# Patient Record
Sex: Male | Born: 1960 | Race: Black or African American | Hispanic: No | Smoking: Never smoker
Health system: Southern US, Community
[De-identification: ages and names within clinical notes are randomized; demographics above are authoritative.]

## PROBLEM LIST (undated history)

## (undated) DIAGNOSIS — F1010 Alcohol abuse, uncomplicated: Secondary | ICD-10-CM

## (undated) DIAGNOSIS — K5730 Diverticulosis of large intestine without perforation or abscess without bleeding: Secondary | ICD-10-CM

## (undated) DIAGNOSIS — F1910 Other psychoactive substance abuse, uncomplicated: Secondary | ICD-10-CM

## (undated) DIAGNOSIS — I10 Essential (primary) hypertension: Secondary | ICD-10-CM

## (undated) DIAGNOSIS — K5792 Diverticulitis of intestine, part unspecified, without perforation or abscess without bleeding: Secondary | ICD-10-CM

## (undated) DIAGNOSIS — E119 Type 2 diabetes mellitus without complications: Secondary | ICD-10-CM

## (undated) DIAGNOSIS — E669 Obesity, unspecified: Secondary | ICD-10-CM

## (undated) HISTORY — DX: Type 2 diabetes mellitus without complications: E11.9

## (undated) HISTORY — DX: Diverticulosis of large intestine without perforation or abscess without bleeding: K57.30

---

## 1997-10-14 ENCOUNTER — Emergency Department (HOSPITAL_COMMUNITY): Admission: EM | Admit: 1997-10-14 | Discharge: 1997-10-14 | Payer: Self-pay | Admitting: Emergency Medicine

## 1999-08-18 ENCOUNTER — Inpatient Hospital Stay (HOSPITAL_COMMUNITY): Admission: EM | Admit: 1999-08-18 | Discharge: 1999-08-20 | Payer: Self-pay | Admitting: *Deleted

## 1999-08-18 ENCOUNTER — Encounter: Payer: Self-pay | Admitting: *Deleted

## 2000-02-17 ENCOUNTER — Encounter: Payer: Self-pay | Admitting: Emergency Medicine

## 2000-02-17 ENCOUNTER — Emergency Department (HOSPITAL_COMMUNITY): Admission: EM | Admit: 2000-02-17 | Discharge: 2000-02-18 | Payer: Self-pay | Admitting: Emergency Medicine

## 2010-02-03 DIAGNOSIS — K5730 Diverticulosis of large intestine without perforation or abscess without bleeding: Secondary | ICD-10-CM

## 2010-02-03 HISTORY — DX: Diverticulosis of large intestine without perforation or abscess without bleeding: K57.30

## 2013-01-24 ENCOUNTER — Encounter (HOSPITAL_COMMUNITY): Payer: Self-pay | Admitting: Emergency Medicine

## 2013-01-24 ENCOUNTER — Emergency Department (HOSPITAL_COMMUNITY)
Admission: EM | Admit: 2013-01-24 | Discharge: 2013-01-24 | Disposition: A | Discharge status: Home / Self Care | Payer: Self-pay | Attending: Emergency Medicine | Admitting: Emergency Medicine

## 2013-01-24 DIAGNOSIS — I10 Essential (primary) hypertension: Secondary | ICD-10-CM | POA: Insufficient documentation

## 2013-01-24 DIAGNOSIS — R42 Dizziness and giddiness: Secondary | ICD-10-CM | POA: Insufficient documentation

## 2013-01-24 DIAGNOSIS — Z79899 Other long term (current) drug therapy: Secondary | ICD-10-CM | POA: Insufficient documentation

## 2013-01-24 DIAGNOSIS — R000 Tachycardia, unspecified: Secondary | ICD-10-CM | POA: Insufficient documentation

## 2013-01-24 DIAGNOSIS — R030 Elevated blood-pressure reading, without diagnosis of hypertension: Secondary | ICD-10-CM

## 2013-01-24 DIAGNOSIS — K922 Gastrointestinal hemorrhage, unspecified: Secondary | ICD-10-CM | POA: Insufficient documentation

## 2013-01-24 HISTORY — DX: Diverticulitis of intestine, part unspecified, without perforation or abscess without bleeding: K57.92

## 2013-01-24 HISTORY — DX: Essential (primary) hypertension: I10

## 2013-01-24 LAB — CBC WITH DIFFERENTIAL/PLATELET
Basophils Absolute: 0 10*3/uL (ref 0.0–0.1)
Basophils Absolute: 0 10*3/uL (ref 0.0–0.1)
Basophils Relative: 0 % (ref 0–1)
Basophils Relative: 0 % (ref 0–1)
Eosinophils Absolute: 0.1 10*3/uL (ref 0.0–0.7)
Eosinophils Absolute: 0.1 10*3/uL (ref 0.0–0.7)
Eosinophils Relative: 1 % (ref 0–5)
Eosinophils Relative: 1 % (ref 0–5)
HCT: 37.1 % — ABNORMAL LOW (ref 39.0–52.0)
HCT: 35 % — ABNORMAL LOW (ref 39.0–52.0)
Hemoglobin: 12.4 g/dL — ABNORMAL LOW (ref 13.0–17.0)
Hemoglobin: 11.8 g/dL — ABNORMAL LOW (ref 13.0–17.0)
Lymphocytes Relative: 21 % (ref 12–46)
Lymphocytes Relative: 21 % (ref 12–46)
Lymphs Abs: 2 10*3/uL (ref 0.7–4.0)
Lymphs Abs: 2.6 10*3/uL (ref 0.7–4.0)
MCH: 28 pg (ref 26.0–34.0)
MCH: 28.2 pg (ref 26.0–34.0)
MCHC: 33.4 g/dL (ref 30.0–36.0)
MCHC: 33.7 g/dL (ref 30.0–36.0)
MCV: 83.7 fL (ref 78.0–100.0)
MCV: 83.7 fL (ref 78.0–100.0)
Monocytes Absolute: 0.7 10*3/uL (ref 0.1–1.0)
Monocytes Absolute: 0.8 10*3/uL (ref 0.1–1.0)
Monocytes Relative: 8 % (ref 3–12)
Monocytes Relative: 6 % (ref 3–12)
Neutro Abs: 6.8 10*3/uL (ref 1.7–7.7)
Neutro Abs: 8.7 10*3/uL — ABNORMAL HIGH (ref 1.7–7.7)
Neutrophils Relative %: 70 % (ref 43–77)
Neutrophils Relative %: 71 % (ref 43–77)
Platelets: 211 10*3/uL (ref 150–400)
Platelets: 187 10*3/uL (ref 150–400)
RBC: 4.43 MIL/uL (ref 4.22–5.81)
RBC: 4.18 MIL/uL — ABNORMAL LOW (ref 4.22–5.81)
RDW: 14.5 % (ref 11.5–15.5)
RDW: 14.6 % (ref 11.5–15.5)
WBC: 9.6 10*3/uL (ref 4.0–10.5)
WBC: 12.2 10*3/uL — ABNORMAL HIGH (ref 4.0–10.5)

## 2013-01-24 LAB — COMPREHENSIVE METABOLIC PANEL
ALT: 18 U/L (ref 0–53)
AST: 16 U/L (ref 0–37)
Albumin: 3.4 g/dL — ABNORMAL LOW (ref 3.5–5.2)
Alkaline Phosphatase: 76 U/L (ref 39–117)
BUN: 18 mg/dL (ref 6–23)
CO2: 27 mEq/L (ref 19–32)
Calcium: 9 mg/dL (ref 8.4–10.5)
Chloride: 102 mEq/L (ref 96–112)
Creatinine, Ser: 1.12 mg/dL (ref 0.50–1.35)
GFR calc Af Amer: 87 mL/min — ABNORMAL LOW (ref >90)
GFR calc non Af Amer: 75 mL/min — ABNORMAL LOW (ref >90)
Glucose, Bld: 149 mg/dL — ABNORMAL HIGH (ref 70–99)
Potassium: 3.6 mEq/L (ref 3.5–5.1)
Sodium: 140 mEq/L (ref 135–145)
Total Bilirubin: 0.3 mg/dL (ref 0.3–1.2)
Total Protein: 6.6 g/dL (ref 6.0–8.3)

## 2013-01-24 LAB — LIPASE, BLOOD: Lipase: 19 U/L (ref 11–59)

## 2013-01-24 LAB — OCCULT BLOOD, POC DEVICE: Fecal Occult Bld: POSITIVE — AB

## 2013-01-24 LAB — TYPE AND SCREEN
ABO/RH(D): A NEG
Antibody Screen: NEGATIVE

## 2013-01-24 MED ORDER — SODIUM CHLORIDE 0.9 % IV BOLUS (SEPSIS)
1000.0000 mL | Freq: Once | INTRAVENOUS | Status: AC
  Administered 2013-01-24: 1000 mL via INTRAVENOUS

## 2013-01-24 NOTE — ED Notes (Signed)
Patient reports that he began having rectal bleeding 3 days ago and describes it as black watery stool. Patient has a history of diverticulitis and states he has had rectal bleeding every year x 3 years. Patient states his abdomen is "bubbling."

## 2013-01-24 NOTE — ED Notes (Signed)
Went in to give pt his discharge instructions  Pt sitting in chair  Removed pt's IV and was talking with pt when he said he was feeling dizzy  Pt became pale and diaphoretic saying he felt like he was going to pass out  Wife at bedside  Cool compress applied to pt's head  B/P taken 112/81 Ginger ale given to pt to sip on  Assisted pt back to bed  Notified MD  Orders received

## 2013-01-24 NOTE — ED Provider Notes (Signed)
CSN: [id number]     Arrival date & time 01/24/13  1746 History   First MD Initiated Contact with Patient 01/24/13 1801     Chief Complaint  Patient presents with  . Rectal Bleeding   (Consider location/radiation/quality/duration/timing/severity/associated sxs/prior Treatment) HPI Comments: The patient is a 52 year-old male with a past medical history of HTN, Diverticulitis, presenting the Emergency Department with a chief complaint of blood in stools for 3 days.  The patient reports dark stools with bright red blood mixed in stool and in the toilet for 3 days.  He reports multiple episodes of blood in stools, 3 episodes today. He reports 3 similar episodes in the past, last episode required hospitalization was greater than 6 months ago and had an endoscopy and colonoscopy. Reports one episode of lightheadedness today after getting out of his vehicle and ambulating.  Denies history of abdominal surgeries, clotting disorders, PUD, or chronic NSAIDs use.  He denies abdominal pain, fever or chills.  He also reports he has not been on any oral anti-HTN medications "in years". No PCP.   The history is provided by the patient and medical records. No language interpreter was used.    Past Medical History  Diagnosis Date  . Diverticulitis   . Hypertension    History reviewed. No pertinent past surgical history. Family History  Problem Relation Age of Onset  . Diabetes Mother   . Diabetes Brother    History  Substance Use Topics  . Smoking status: Never Smoker   . Smokeless tobacco: Never Used  . Alcohol Use: No    Review of Systems  Constitutional: Negative for fever and chills.  Gastrointestinal: Positive for diarrhea, blood in stool and anal bleeding. Negative for nausea, vomiting, abdominal pain, constipation and rectal pain.  Genitourinary: Negative for flank pain.  Neurological: Positive for light-headedness. Negative for syncope.    Allergies  Review of patient's allergies  indicates no known allergies.  Home Medications   Current Outpatient Rx  Name  Route  Sig  Dispense  Refill  . CRANBERRY PO   Oral   Take 1 tablet by mouth daily.         . Multiple Vitamin (MULTIVITAMIN WITH MINERALS) TABS tablet   Oral   Take 1 tablet by mouth daily.         . Omega-3 Fatty Acids (FISH OIL PO)   Oral   Take 1 capsule by mouth daily.         . psyllium (HYDROCIL/METAMUCIL) 95 % PACK   Oral   Take 1 packet by mouth daily.          BP 162/98  Pulse 95  Temp(Src) 98.7 F (37.1 C) (Oral)  Resp 18  SpO2 97% Physical Exam  Nursing note and vitals reviewed. Constitutional: He appears well-developed and well-nourished. No distress.  HENT:  Head: Normocephalic and atraumatic.  Eyes: EOM are normal. No scleral icterus.  No pale conjunctiva   Neck: Neck supple.  Cardiovascular: Regular rhythm.  Tachycardia present.   Pulmonary/Chest: No respiratory distress. He has no wheezes. He has no rales.  Abdominal: Soft. Bowel sounds are normal. He exhibits no distension. There is no tenderness. There is no rigidity, no rebound, no guarding and negative Murphy's sign.  Genitourinary: Rectal exam shows no external hemorrhoid, no mass and no tenderness. Guaiac positive stool.  Gross blood per DRE. Chaperone present.  Neurological: He is alert.  Skin: Skin is warm and dry. No pallor.  Psychiatric: He has a  normal mood and affect. His behavior is normal.    ED Course  Procedures (including critical care time) Labs Review Labs Reviewed  COMPREHENSIVE METABOLIC PANEL - Abnormal; Notable for the following:    Glucose, Bld 149 (*)    Albumin 3.4 (*)    GFR calc non Af Amer 75 (*)    GFR calc Af Amer 87 (*)    All other components within normal limits  CBC WITH DIFFERENTIAL - Abnormal; Notable for the following:    Hemoglobin 12.4 (*)    HCT 37.1 (*)    All other components within normal limits  CBC WITH DIFFERENTIAL - Abnormal; Notable for the following:     WBC 12.2 (*)    RBC 4.18 (*)    Hemoglobin 11.8 (*)    HCT 35.0 (*)    Neutro Abs 8.7 (*)    All other components within normal limits  OCCULT BLOOD, POC DEVICE - Abnormal; Notable for the following:    Fecal Occult Bld POSITIVE (*)    All other components within normal limits  LIPASE, BLOOD  TYPE AND SCREEN  ABO/RH   Imaging Review No results found.  EKG Interpretation    Date/Time:  Monday January 24 2013 22:08:27 EST Ventricular Rate:  82 PR Interval:  193 QRS Duration: 95 QT Interval:  386 QTC Calculation: 451 R Axis:   3 Text Interpretation:  Sinus rhythm Abnormal T, consider ischemia, lateral leads ED PHYSICIAN INTERPRETATION AVAILABLE IN [facility] Confirmed by TEST, RECORD ([id number]) on 01/26/2013 10:09:50 AM            MDM   1. Gastrointestinal bleed   2. Elevated blood pressure reading    Pt with a history of GI bleed presents with blood per DRE without abdominal pain, 3 previous episodes in the past.  Labs ordered. Discussed patient history, condition, and labs with Dr. Juleen China who agrees the patient can be evaluated as an out-pt. Discussed lab results,  and treatment plan with the patient. Return precautions given. Reports understanding and no other concerns at this time.  Patient is stable for discharge at this time. Patient with what is described by a nurse to have a vasovagal near syncope event.  Pt is resting in room.  The patient is able to ambulate in the ED without lightheadedness and without orthostatic hypotension.   Meds given in ED:  Medications  sodium chloride 0.9 % bolus 1,000 mL (0 mLs Intravenous Stopped 01/24/13 2015)    Discharge Medication List as of 01/24/2013  9:31 PM          Leotis Shames Doretha Imus, PA-C 01/26/13 1847

## 2013-01-24 NOTE — ED Notes (Signed)
Ambulated pt in hall from his room to the opposite side of the nursing station and back  Pt tolerated well  VS stable after walking  PA notified

## 2013-01-24 NOTE — ED Notes (Signed)
PA at bedside.

## 2013-01-25 LAB — ABO/RH: ABO/RH(D): A NEG

## 2013-02-01 NOTE — ED Provider Notes (Signed)
Medical screening examination/treatment/procedure(s) were performed by non-physician practitioner and as supervising physician I was immediately available for consultation/collaboration.  EKG Interpretation    Date/Time:  Monday January 24 2013 22:08:27 EST Ventricular Rate:  82 PR Interval:  193 QRS Duration: 95 QT Interval:  386 QTC Calculation: 451 R Axis:   3 Text Interpretation:  Sinus rhythm Abnormal T, consider ischemia, lateral leads ED PHYSICIAN INTERPRETATION AVAILABLE IN [facility] Confirmed by TEST, RECORD ([id number]) on 01/26/2013 10:09:50 AM             Raeford Razor, MD 02/01/13 2200

## 2013-08-03 ENCOUNTER — Encounter (HOSPITAL_COMMUNITY): Payer: Self-pay | Admitting: Emergency Medicine

## 2013-08-03 ENCOUNTER — Emergency Department (HOSPITAL_COMMUNITY): Payer: Worker's Compensation

## 2013-08-03 ENCOUNTER — Emergency Department (HOSPITAL_COMMUNITY)
Admission: EM | Admit: 2013-08-03 | Discharge: 2013-08-03 | Disposition: A | Discharge status: Home / Self Care | Payer: Worker's Compensation | Attending: Emergency Medicine | Admitting: Emergency Medicine

## 2013-08-03 DIAGNOSIS — S6990XA Unspecified injury of unspecified wrist, hand and finger(s), initial encounter: Secondary | ICD-10-CM | POA: Insufficient documentation

## 2013-08-03 DIAGNOSIS — M79641 Pain in right hand: Secondary | ICD-10-CM

## 2013-08-03 DIAGNOSIS — W19XXXA Unspecified fall, initial encounter: Secondary | ICD-10-CM

## 2013-08-03 DIAGNOSIS — Z79899 Other long term (current) drug therapy: Secondary | ICD-10-CM | POA: Insufficient documentation

## 2013-08-03 DIAGNOSIS — Y9389 Activity, other specified: Secondary | ICD-10-CM | POA: Insufficient documentation

## 2013-08-03 DIAGNOSIS — I10 Essential (primary) hypertension: Secondary | ICD-10-CM | POA: Insufficient documentation

## 2013-08-03 DIAGNOSIS — Y9289 Other specified places as the place of occurrence of the external cause: Secondary | ICD-10-CM | POA: Insufficient documentation

## 2013-08-03 DIAGNOSIS — W010XXA Fall on same level from slipping, tripping and stumbling without subsequent striking against object, initial encounter: Secondary | ICD-10-CM | POA: Insufficient documentation

## 2013-08-03 DIAGNOSIS — Z8719 Personal history of other diseases of the digestive system: Secondary | ICD-10-CM | POA: Insufficient documentation

## 2013-08-03 MED ORDER — HYDROCODONE-ACETAMINOPHEN 5-325 MG PO TABS
1.0000 | ORAL_TABLET | Freq: Once | ORAL | Status: AC
  Administered 2013-08-03: 1 via ORAL
  Filled 2013-08-03: qty 1

## 2013-08-03 MED ORDER — HYDROCODONE-ACETAMINOPHEN 5-325 MG PO TABS: 1.0000 | ORAL_TABLET | Freq: Four times a day (QID) | ORAL | Status: DC | PRN

## 2013-08-03 MED ORDER — ONDANSETRON 4 MG PO TBDP
8.0000 mg | ORAL_TABLET | Freq: Once | ORAL | Status: AC
  Administered 2013-08-03: 8 mg via ORAL
  Filled 2013-08-03: qty 2

## 2013-08-03 MED ORDER — ONDANSETRON HCL 4 MG PO TABS: 4.0000 mg | ORAL_TABLET | Freq: Four times a day (QID) | ORAL | Status: DC

## 2013-08-03 NOTE — ED Provider Notes (Signed)
CSN: [id number]     Arrival date & time 08/03/13  2119 History  This chart was scribed for Junious SilkHannah Elmar Antigua, PA-C working with Gilda Creasehristopher J. Pollina, MD by Evon Slackerrance Branch, ED Scribe. This patient was seen in room TR05C/TR05C and the patient's care was started at 10:03 PM.      Chief Complaint  Patient presents with  . Fall   Patient is a 53 y.o. male presenting with fall. The history is provided by the patient. No language interpreter was used.  Fall Pertinent negatives include no chest pain and no headaches.   HPI Comments: Nicholaus CorollaGary Schaible is a 53 y.o. male who presents to the Emergency Department complaining of fall onset prior to arrival. He was cleaning the floors when he lost his footing and fell backwards on his wrist. No LOC, confusion, disorientation. He has no other injuries other than right wrist pain. He describes this as a tingling pain. He states that he has a history of a fracture in this wrist. He has been applying ice with some relief. He denies headache, nausea, chest pain, or vomiting.   Past Medical History  Diagnosis Date  . Diverticulitis   . Hypertension    History reviewed. No pertinent past surgical history. Family History  Problem Relation Age of Onset  . Diabetes Mother   . Diabetes Brother    History  Substance Use Topics  . Smoking status: Never Smoker   . Smokeless tobacco: Never Used  . Alcohol Use: No    Review of Systems  Cardiovascular: Negative for chest pain.  Gastrointestinal: Negative for nausea and vomiting.  Musculoskeletal: Positive for arthralgias.  Neurological: Negative for syncope and headaches.    Allergies  Review of patient's allergies indicates no known allergies.  Home Medications   Prior to Admission medications   Medication Sig Start Date End Date Taking? Authorizing Provider  CRANBERRY PO Take 1 tablet by mouth daily.    Historical Provider, MD  Multiple Vitamin (MULTIVITAMIN WITH MINERALS) TABS tablet Take 1 tablet by  mouth daily.    Historical Provider, MD  Omega-3 Fatty Acids (FISH OIL PO) Take 1 capsule by mouth daily.    Historical Provider, MD  psyllium (HYDROCIL/METAMUCIL) 95 % PACK Take 1 packet by mouth daily.    Historical Provider, MD   Triage Vitals: BP 189/107  Pulse 101  Temp(Src) 98.1 F (36.7 C) (Oral)  Resp 18  Ht 5' 9.5" (1.765 m)  Wt 307 lb (139.254 kg)  BMI 44.70 kg/m2  SpO2 95%   Physical Exam  Nursing note and vitals reviewed. Constitutional: He is oriented to person, place, and time. He appears well-developed and well-nourished. No distress.  HENT:  Head: Normocephalic and atraumatic.  Right Ear: External ear normal.  Left Ear: External ear normal.  Nose: Nose normal.  Eyes: Conjunctivae are normal.  Neck: Normal range of motion. No tracheal deviation present.  Cardiovascular: Normal rate, regular rhythm, normal heart sounds, intact distal pulses and normal pulses.   Pulses:      Radial pulses are 2+ on the right side.  Capillary refill < 3 seconds in all fingers  Pulmonary/Chest: Effort normal and breath sounds normal. No stridor.  Abdominal: Soft. He exhibits no distension. There is no tenderness.  Musculoskeletal: Normal range of motion. He exhibits tenderness.  Tenderness to palpation over 4th and 5th metacarpals. No bruising. No tenderness to palpation over thumb. Grip strength 5/5 bilaterally  Neurological: He is alert and oriented to person, place, and time. He has  normal strength. No sensory deficit. GCS eye subscore is 4. GCS verbal subscore is 5. GCS motor subscore is 6.  Sensation intact in all fingers  Skin: Skin is warm and dry. He is not diaphoretic.  Psychiatric: He has a normal mood and affect. His behavior is normal.    ED Course  Procedures (including critical care time) DIAGNOSTIC STUDIES: Oxygen Saturation is 95% on RA, adequate by my interpretation.    COORDINATION OF CARE:  Labs Review Labs Reviewed - No data to display  Imaging  Review  Dg Wrist Complete Right  08/03/2013   CLINICAL DATA:  Fall.  EXAM: RIGHT WRIST - COMPLETE 3+ VIEW  COMPARISON:  None.  FINDINGS: Remote fifth metacarpal shaft with healed angulation. No acute fracture suspected. No acute carpal or radiocarpal malalignment. There is lateral subluxation of the thumb base, usually from chronic ligamentous laxity. No definite associated fracture. There is degenerative changes to the interphalangeal and MCP joints of the thumb.  IMPRESSION: 1. Negative for acute fracture. 2. 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 If focal tenderness, dedicated thumb radiograph could be obtained. 3. Remote boxer's fracture.   Electronically Signed   By: Tiburcio PeaJonathan  Watts M.D.   On: 08/03/2013 22:19     EKG Interpretation None      MDM   Final diagnoses:  Fall, initial encounter  Right hand pain   Patient presents to ED for right hand pain after fall. Patient has prior injury to this hand. XR is negative for acute fracture. Patient is neurovascularly intact and compartment is soft. No tenderness to palpation over thumb or anatomic snuff box. No concern for occult scaphoid fracture. Patient was placed in an ace bandage and given hand follow up. Discussed case with Dr. Blinda LeatherwoodPollina who agrees with plan. Return instructions given. Vital signs stable for discharge. Patient / Family / Caregiver informed of clinical course, understand medical decision-making process, and agree with plan.   I personally performed the services described in this documentation, which was scribed in my presence. The recorded information has been reviewed and is accurate.   Mora BellmanHannah S Meaghen Vecchiarelli, PA-C 08/09/13 1034

## 2013-08-03 NOTE — ED Notes (Signed)
Pt. slipped on slippery floor fell backwards this evening , no LOC / ambulatory / alert and oriented / respirations unlabored , reports pain at right wrist.

## 2013-08-03 NOTE — Discharge Instructions (Signed)

## 2013-08-03 NOTE — ED Notes (Signed)
Patient transported to X-ray 

## 2013-08-10 NOTE — ED Provider Notes (Signed)
Medical screening examination/treatment/procedure(s) were performed by non-physician practitioner and as supervising physician I was immediately available for consultation/collaboration.   EKG Interpretation None        Raijon Lindfors J. Chennel Olivos, MD 08/10/13 0905 

## 2013-11-17 ENCOUNTER — Other Ambulatory Visit: Payer: Self-pay

## 2013-11-17 ENCOUNTER — Encounter: Payer: Self-pay | Admitting: Internal Medicine

## 2013-11-17 ENCOUNTER — Ambulatory Visit: Payer: Self-pay | Attending: Internal Medicine | Admitting: Internal Medicine

## 2013-11-17 VITALS — BP 174/108 | HR 72 | Temp 98.2°F | Resp 20 | Ht 69.5 in | Wt 304.8 lb

## 2013-11-17 DIAGNOSIS — K5792 Diverticulitis of intestine, part unspecified, without perforation or abscess without bleeding: Secondary | ICD-10-CM | POA: Insufficient documentation

## 2013-11-17 DIAGNOSIS — N529 Male erectile dysfunction, unspecified: Secondary | ICD-10-CM | POA: Insufficient documentation

## 2013-11-17 DIAGNOSIS — H538 Other visual disturbances: Secondary | ICD-10-CM | POA: Insufficient documentation

## 2013-11-17 DIAGNOSIS — M7121 Synovial cyst of popliteal space [Baker], right knee: Secondary | ICD-10-CM | POA: Insufficient documentation

## 2013-11-17 DIAGNOSIS — I10 Essential (primary) hypertension: Secondary | ICD-10-CM | POA: Insufficient documentation

## 2013-11-17 DIAGNOSIS — Z23 Encounter for immunization: Secondary | ICD-10-CM | POA: Insufficient documentation

## 2013-11-17 DIAGNOSIS — R202 Paresthesia of skin: Secondary | ICD-10-CM | POA: Insufficient documentation

## 2013-11-17 MED ORDER — CLONIDINE HCL 0.1 MG PO TABS
0.2000 mg | ORAL_TABLET | Freq: Once | ORAL | Status: AC
  Administered 2013-11-17: 0.2 mg via ORAL

## 2013-11-17 MED ORDER — HYDROCHLOROTHIAZIDE 25 MG PO TABS: 25.0000 mg | ORAL_TABLET | Freq: Every day | ORAL | Status: DC

## 2013-11-17 MED ORDER — LOSARTAN POTASSIUM 25 MG PO TABS: 25.0000 mg | ORAL_TABLET | Freq: Every day | ORAL | Status: DC

## 2013-11-17 NOTE — Progress Notes (Signed)
Patient ID: Jeffrey Marquez, male   DOB: 08-Jan-1961, 53 y.o.   MRN: [id number]  [id number]  [id number]  DOB - 08-Jan-1961  CC:  Chief Complaint  Patient presents with  . Establish Care  . Hypertension       HPI: Jeffrey Marquez is a 53 y.o. male here today to establish medical care.  Patient reports that he has a long history of hypertension and was receiving norvasc and HCTZ while incarcerated.  He states that he was released last month and has not had any medications since his release.  Today he reports a severe headaches and blurred vision.  He denies chest pain, palpitations, and SOB.    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  Still has lump and often becomes painful.    Need eye exam and colonoscopy  No Known Allergies Past Medical History  Diagnosis Date  . Diverticulitis   . Hypertension   . Diverticula of colon 2012   Current Outpatient Prescriptions on File Prior to Visit  Medication Sig Dispense Refill  . Multiple Vitamin (MULTIVITAMIN WITH MINERALS) TABS tablet Take 1 tablet by mouth daily.      . Omega-3 Fatty Acids (FISH OIL PO) Take 1 capsule by mouth daily.      [name]. Bioflavonoid Products (BIOFLEX PO) Take 1 tablet by mouth daily.      [name]. HYDROcodone-acetaminophen (NORCO/VICODIN) 5-325 MG per tablet Take 1-2 tablets by mouth every 6 (six) hours as needed for moderate pain or severe pain.  10 tablet  0  . ondansetron (ZOFRAN) 4 MG tablet Take 1 tablet (4 mg total) by mouth every 6 (six) hours.  12 tablet  0   No current facility-administered medications on file prior to visit.   Family History  Problem Relation Age of Onset  . Diabetes Mother   . Diabetes Brother    History   Social History  . Marital Status: Divorced    Spouse Name: N/A    Number of Children: N/A  . Years of Education: N/A   Occupational History  . Not on file.   Social History Main Topics  . Smoking status: Never Smoker   . Smokeless  tobacco: Never Used  . Alcohol Use: No  . Drug Use: No  . Sexual Activity: Not on file   Other Topics Concern  . Not on file   Social History Narrative  . No narrative on file    Review of Systems  Eyes: Positive for blurred vision.       Flashing lights   Respiratory: Negative.   Cardiovascular: Negative.   Gastrointestinal: Negative.        Diverticulitis   Genitourinary: Positive for frequency. Negative for dysuria, hematuria and flank pain.       Erectile dysfunction--can obtain erection but does not last  Musculoskeletal: Negative.   Neurological: Positive for tingling (finges/toes randomly) and headaches. Negative for dizziness.  Endo/Heme/Allergies: Negative.   Psychiatric/Behavioral: Negative.      Objective:   Filed Vitals:   11/17/13 1058  BP: 196/110  Pulse: 83  Temp: 98.2 F (36.8 C)  Resp: 20    Physical Exam  Constitutional: He is oriented to person, place, and time.  HENT:  Right Ear: External ear normal.  Left Ear: External ear normal.  Mouth/Throat: Oropharynx is clear and moist.  Eyes: Conjunctivae and EOM are normal. Pupils are equal, round, and reactive to light.  Neck: Normal range of motion.  Neck supple. No thyromegaly present.  Cardiovascular: Normal rate, regular rhythm and normal heart sounds.   Pulmonary/Chest: Effort normal and breath sounds normal. No respiratory distress.  Abdominal: Soft. Bowel sounds are normal.  Obese round abdomen   Musculoskeletal: Normal range of motion. He exhibits edema (right knee/shin from bakers cyst). He exhibits no tenderness.  Lymphadenopathy:    He has no cervical adenopathy.  Neurological: He is alert and oriented to person, place, and time. He has normal reflexes. No cranial nerve deficit.  Skin: Skin is warm and dry.  Psychiatric: He has a normal mood and affect.     Lab Results  Component Value Date   WBC 12.2* 01/24/2013   HGB 11.8* 01/24/2013   HCT 35.0* 01/24/2013   MCV 83.7  01/24/2013   PLT 187 01/24/2013   Lab Results  Component Value Date   CREATININE 1.12 01/24/2013   BUN 18 01/24/2013   NA 140 01/24/2013   K 3.6 01/24/2013   CL 102 01/24/2013   CO2 27 01/24/2013    No results found for this basename: HGBA1C   Lipid Panel  No results found for this basename: chol, trig, hdl, cholhdl, vldl, ldlcalc       Assessment and plan:   Jeffrey HiddenGary was seen today for establish care and hypertension.  Diagnoses and associated orders for this visit:  Accelerated hypertension - cloNIDine (CATAPRES) tablet 0.2 mg; Take 2 tablets (0.2 mg total) by mouth once. - PSA; Future - CBC; Future - COMPLETE METABOLIC PANEL WITH GFR; Future - Lipid panel; Future - TSH; Future - Begin hydrochlorothiazide (HYDRODIURIL) 25 MG tablet; Take 1 tablet (25 mg total) by mouth daily. For blood pressure - Begin losartan (COZAAR) 25 MG tablet; Take 1 tablet (25 mg total) by mouth daily. For blood pressure  Blurred vision - Ambulatory referral to Ophthalmology  Tingling in extremities - Hemoglobin A1C; Future  Need for prophylactic vaccination and inoculation against influenza - Flu Vaccine QUAD 36+ mos PF IM (Fluarix Quad PF)    Return in about 1 day (around 11/18/2013) for Lab Visit and 2 weeks Nurse Visit-BP check and 3 mo PCP.  The patient was given clear instructions to go to ER or return to medical center if symptoms don't improve, worsen or new problems develop. The patient verbalized understanding.   Holland CommonsKECK, VALERIE, NP-C [facility] [phone] 11/20/2013, 10:19 PM

## 2013-11-17 NOTE — Progress Notes (Signed)
Patient presents to establish care for HTN No BP meds for 1 month Was taking HCTZ and norvasc C/o throbbing headache for 4 weeks at temples; rates 7/10 at present c/o intermittent blurry vision Denies chest pain Also c/o low libido  BP 196/110 left arm manually P 83  Clonidine 0.2 mg given PO per protocol. Provider aware  BP recheck after 60 minutes: 174/108 Patient states headache is better; now rates  /10

## 2013-11-17 NOTE — Patient Instructions (Signed)
DASH Eating Plan DASH stands for "Dietary Approaches to Stop Hypertension." The DASH eating plan is a healthy eating plan that has been shown to reduce high blood pressure (hypertension). Additional health benefits may include reducing the risk of type 2 diabetes mellitus, heart disease, and stroke. The DASH eating plan may also help with weight loss. WHAT DO I NEED TO KNOW ABOUT THE DASH EATING PLAN? For the DASH eating plan, you will follow these general guidelines:  Choose foods with a percent daily value for sodium of less than 5% (as listed on the food label).  Use salt-free seasonings or herbs instead of table salt or sea salt.  Check with your health care provider or pharmacist before using salt substitutes.  Eat lower-sodium products, often labeled as "lower sodium" or "no salt added."  Eat fresh foods.  Eat more vegetables, fruits, and low-fat dairy products.  Choose whole grains. Look for the word "whole" as the first word in the ingredient list.  Choose fish and skinless chicken or turkey more often than red meat. Limit fish, poultry, and meat to 6 oz (170 g) each day.  Limit sweets, desserts, sugars, and sugary drinks.  Choose heart-healthy fats.  Limit cheese to 1 oz (28 g) per day.  Eat more home-cooked food and less restaurant, buffet, and fast food.  Limit fried foods.  Cook foods using methods other than frying.  Limit canned vegetables. If you do use them, rinse them well to decrease the sodium.  When eating at a restaurant, ask that your food be prepared with less salt, or no salt if possible. WHAT FOODS CAN I EAT? Seek help from a dietitian for individual calorie needs. Grains Whole grain or whole wheat bread. Brown rice. Whole grain or whole wheat pasta. Quinoa, bulgur, and whole grain cereals. Low-sodium cereals. Corn or whole wheat flour tortillas. Whole grain cornbread. Whole grain crackers. Low-sodium crackers. Vegetables Fresh or frozen vegetables  (raw, steamed, roasted, or grilled). Low-sodium or reduced-sodium tomato and vegetable juices. Low-sodium or reduced-sodium tomato sauce and paste. Low-sodium or reduced-sodium canned vegetables.  Fruits All fresh, canned (in natural juice), or frozen fruits. Meat and Other Protein Products Ground beef (85% or leaner), grass-fed beef, or beef trimmed of fat. Skinless chicken or turkey. Ground chicken or turkey. Pork trimmed of fat. All fish and seafood. Eggs. Dried beans, peas, or lentils. Unsalted nuts and seeds. Unsalted canned beans. Dairy Low-fat dairy products, such as skim or 1% milk, 2% or reduced-fat cheeses, low-fat ricotta or cottage cheese, or plain low-fat yogurt. Low-sodium or reduced-sodium cheeses. Fats and Oils Tub margarines without trans fats. Light or reduced-fat mayonnaise and salad dressings (reduced sodium). Avocado. Safflower, olive, or canola oils. Natural peanut or almond butter. Other Unsalted popcorn and pretzels. The items listed above may not be a complete list of recommended foods or beverages. Contact your dietitian for more options. WHAT FOODS ARE NOT RECOMMENDED? Grains White bread. White pasta. White rice. Refined cornbread. Bagels and croissants. Crackers that contain trans fat. Vegetables Creamed or fried vegetables. Vegetables in a cheese sauce. Regular canned vegetables. Regular canned tomato sauce and paste. Regular tomato and vegetable juices. Fruits Dried fruits. Canned fruit in light or heavy syrup. Fruit juice. Meat and Other Protein Products Fatty cuts of meat. Ribs, chicken wings, bacon, sausage, bologna, salami, chitterlings, fatback, hot dogs, bratwurst, and packaged luncheon meats. Salted nuts and seeds. Canned beans with salt. Dairy Whole or 2% milk, cream, half-and-half, and cream cheese. Whole-fat or sweetened yogurt. Full-fat   cheeses or blue cheese. Nondairy creamers and whipped toppings. Processed cheese, cheese spreads, or cheese  curds. Condiments Onion and garlic salt, seasoned salt, table salt, and sea salt. Canned and packaged gravies. Worcestershire sauce. Tartar sauce. Barbecue sauce. Teriyaki sauce. Soy sauce, including reduced sodium. Steak sauce. Fish sauce. Oyster sauce. Cocktail sauce. Horseradish. Ketchup and mustard. Meat flavorings and tenderizers. Bouillon cubes. Hot sauce. Tabasco sauce. Marinades. Taco seasonings. Relishes. Fats and Oils Butter, stick margarine, lard, shortening, ghee, and bacon fat. Coconut, palm kernel, or palm oils. Regular salad dressings. Other Pickles and olives. Salted popcorn and pretzels. The items listed above may not be a complete list of foods and beverages to avoid. Contact your dietitian for more information. WHERE CAN I FIND MORE INFORMATION? [employer], [employer]: www.nhlbi.nih.gov/health/health-topics/topics/dash/ Document Released: 01/09/2011 Document Revised: 06/06/2013 Document Reviewed: 11/24/2012 Exit[employer] Patient Information 2015 [employer]. This information is not intended to replace advice given to you by your health care provider. Make sure you discuss any questions you have with your health care provider. Hypertension Hypertension, commonly called high blood pressure, is when the force of blood pumping through your arteries is too strong. Your arteries are the blood vessels that carry blood from your heart throughout your body. A blood pressure reading consists of a higher number over a lower number, such as 110/72. The higher number (systolic) is the pressure inside your arteries when your heart pumps. The lower number (diastolic) is the pressure inside your arteries when your heart relaxes. Ideally you want your blood pressure below 120/80. Hypertension forces your heart to work harder to pump blood. Your arteries may become narrow or stiff. Having hypertension puts you at risk for heart disease, stroke, and other problems.  RISK  FACTORS Some risk factors for high blood pressure are controllable. Others are not.  Risk factors you cannot control include:   Race. You may be at higher risk if you are African American.  Age. Risk increases with age.  Gender. Men are at higher risk than women before age 45 years. After age 65, women are at higher risk than men. Risk factors you can control include:  Not getting enough exercise or physical activity.  Being overweight.  Getting too much fat, sugar, calories, or salt in your diet.  Drinking too much alcohol. SIGNS AND SYMPTOMS Hypertension does not usually cause signs or symptoms. Extremely high blood pressure (hypertensive crisis) may cause headache, anxiety, shortness of breath, and nosebleed. DIAGNOSIS  To check if you have hypertension, your health care provider will measure your blood pressure while you are seated, with your arm held at the level of your heart. It should be measured at least twice using the same arm. Certain conditions can cause a difference in blood pressure between your right and left arms. A blood pressure reading that is higher than normal on one occasion does not mean that you need treatment. If one blood pressure reading is high, ask your health care provider about having it checked again. TREATMENT  Treating high blood pressure includes making lifestyle changes and possibly taking medicine. Living a healthy lifestyle can help lower high blood pressure. You may need to change some of your habits. Lifestyle changes may include:  Following the DASH diet. This diet is high in fruits, vegetables, and whole grains. It is low in salt, red meat, and added sugars.  Getting at least 2 hours of brisk physical activity every week.  Losing weight if necessary.  Not smoking.  Limiting   alcoholic beverages.  Learning ways to reduce stress. If lifestyle changes are not enough to get your blood pressure under control, your health care provider may  prescribe medicine. You may need to take more than one. Work closely with your health care provider to understand the risks and benefits. HOME CARE INSTRUCTIONS  Have your blood pressure rechecked as directed by your health care provider.   Take medicines only as directed by your health care provider. Follow the directions carefully. Blood pressure medicines must be taken as prescribed. The medicine does not work as well when you skip doses. Skipping doses also puts you at risk for problems.   Do not smoke.   Monitor your blood pressure at home as directed by your health care provider. SEEK MEDICAL CARE IF:   You think you are having a reaction to medicines taken.  You have recurrent headaches or feel dizzy.  You have swelling in your ankles.  You have trouble with your vision. SEEK IMMEDIATE MEDICAL CARE IF:  You develop a severe headache or confusion.  You have unusual weakness, numbness, or feel faint.  You have severe chest or abdominal pain.  You vomit repeatedly.  You have trouble breathing. MAKE SURE YOU:   Understand these instructions.  Will watch your condition.  Will get help right away if you are not doing well or get worse. Document Released: 01/20/2005 Document Revised: 06/06/2013 Document Reviewed: 11/12/2012 [employer] Patient Information 2015 [employer]. This information is not intended to replace advice given to you by your health care provider. Make sure you discuss any questions you have with your health care provider.  

## 2013-11-18 ENCOUNTER — Telehealth: Payer: Self-pay | Admitting: Internal Medicine

## 2013-11-18 ENCOUNTER — Ambulatory Visit: Payer: Self-pay | Attending: Internal Medicine

## 2013-11-18 DIAGNOSIS — R202 Paresthesia of skin: Secondary | ICD-10-CM | POA: Insufficient documentation

## 2013-11-18 DIAGNOSIS — I10 Essential (primary) hypertension: Secondary | ICD-10-CM | POA: Insufficient documentation

## 2013-11-18 LAB — CBC
HCT: 42.7 % (ref 39.0–52.0)
Hemoglobin: 14.4 g/dL (ref 13.0–17.0)
MCH: 29.2 pg (ref 26.0–34.0)
MCHC: 33.7 g/dL (ref 30.0–36.0)
MCV: 86.6 fL (ref 78.0–100.0)
Platelets: 209 10*3/uL (ref 150–400)
RBC: 4.93 MIL/uL (ref 4.22–5.81)
RDW: 14.4 % (ref 11.5–15.5)
WBC: 7.6 10*3/uL (ref 4.0–10.5)

## 2013-11-18 LAB — COMPLETE METABOLIC PANEL WITH GFR
ALBUMIN: 4 g/dL (ref 3.5–5.2)
ALT: 26 U/L (ref 0–53)
AST: 17 U/L (ref 0–37)
Alkaline Phosphatase: 89 U/L (ref 39–117)
BUN: 10 mg/dL (ref 6–23)
CALCIUM: 9.4 mg/dL (ref 8.4–10.5)
CO2: 25 meq/L (ref 19–32)
CREATININE: 0.84 mg/dL (ref 0.50–1.35)
Chloride: 100 mEq/L (ref 96–112)
GFR, Est Non African American: >89 mL/min
Glucose, Bld: 100 mg/dL — ABNORMAL HIGH (ref 70–99)
Potassium: 3.7 mEq/L (ref 3.5–5.3)
Sodium: 139 mEq/L (ref 135–145)
Total Bilirubin: 0.7 mg/dL (ref 0.2–1.2)
Total Protein: 7 g/dL (ref 6.0–8.3)

## 2013-11-18 LAB — LIPID PANEL
Cholesterol: 235 mg/dL — ABNORMAL HIGH (ref 0–200)
HDL: 35 mg/dL — ABNORMAL LOW (ref >39)
LDL CALC: 139 mg/dL — AB (ref 0–99)
TRIGLYCERIDES: 306 mg/dL — AB (ref <150)
Total CHOL/HDL Ratio: 6.7 Ratio
VLDL: 61 mg/dL — ABNORMAL HIGH (ref 0–40)

## 2013-11-18 NOTE — Telephone Encounter (Signed)
Patient came yesterday and after visit he got prescriptions. However, he doesn't have Hyrdrocodone-acetaminophen 5-325 MG and Ondasetron 4 MG. Patient wants to know if he needs to get those prescriptions or not. Please follow up with Patient.

## 2013-11-18 NOTE — Progress Notes (Unsigned)
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

## 2013-11-19 LAB — PSA: PSA: 1.36 ng/mL (ref <=4.00)

## 2013-11-19 LAB — TSH: TSH: 1.453 u[IU]/mL (ref 0.350–4.500)

## 2013-11-19 LAB — HEMOGLOBIN A1C
Hgb A1c MFr Bld: 6.5 % — ABNORMAL HIGH (ref <5.7)
MEAN PLASMA GLUCOSE: 140 mg/dL — AB (ref <117)

## 2013-11-27 ENCOUNTER — Encounter: Payer: Self-pay | Admitting: Internal Medicine

## 2013-11-27 DIAGNOSIS — E119 Type 2 diabetes mellitus without complications: Secondary | ICD-10-CM | POA: Insufficient documentation

## 2013-11-29 ENCOUNTER — Telehealth: Payer: Self-pay | Admitting: Emergency Medicine

## 2013-11-29 MED ORDER — ATORVASTATIN CALCIUM 10 MG PO TABS: 10.0000 mg | ORAL_TABLET | Freq: Every day | ORAL | Status: DC

## 2013-11-29 MED ORDER — METFORMIN HCL ER 500 MG PO TB24: 500.0000 mg | ORAL_TABLET | Freq: Every day | ORAL | Status: DC

## 2013-11-29 NOTE — Telephone Encounter (Signed)
Pt given lab results with new diagnosis DM. Pt instructed to start taking Metformin XR 500 mg tab daily and Atorvastatin 10 mg tab Pt was already scheduled for nurse visit 12/02/13 @ 11am. Added CBG with Diabetes teaching and using meter with triage nurse

## 2013-11-29 NOTE — Telephone Encounter (Signed)
Left message on VM for pt to call for lab results Medication Metformin ordered and e-scribed to pharmacy

## 2013-11-29 NOTE — Telephone Encounter (Signed)
Message copied by Darlis LoanSMITH, JILL D on Tue Nov 29, 2013 11:23 AM ------      Message from: Ambrose FinlandKECK, VALERIE A      Created: Sun Nov 27, 2013  9:33 PM       Patient is at criteria for diabetes diagnoses. Please have patient to begin metformin XR 500 mg once daily. Please have patient to schedule nurse visit with Leotis ShamesLauren for explanation and education of diabetes.  Patient will also need to begin on medication for elevated cholesterol and to help decrease chance of cardiovascular events.  Please send atorvastatin 10 mg daily. ------

## 2013-12-02 ENCOUNTER — Ambulatory Visit: Payer: Self-pay | Attending: Internal Medicine

## 2013-12-02 VITALS — BP 169/113 | HR 85 | Resp 16

## 2013-12-02 DIAGNOSIS — I10 Essential (primary) hypertension: Secondary | ICD-10-CM

## 2013-12-02 DIAGNOSIS — E119 Type 2 diabetes mellitus without complications: Secondary | ICD-10-CM

## 2013-12-02 LAB — GLUCOSE, POCT (MANUAL RESULT ENTRY): POC GLUCOSE: 458 mg/dL — AB (ref 70–99)

## 2013-12-02 MED ORDER — TRUERESULT BLOOD GLUCOSE W/DEVICE KIT: PACK | Status: DC

## 2013-12-02 MED ORDER — LOSARTAN POTASSIUM 50 MG PO TABS: 50.0000 mg | ORAL_TABLET | Freq: Every day | ORAL | Status: DC

## 2013-12-02 NOTE — Patient Instructions (Signed)
Increase Losartan to 50 mg tablet daily and continue taking Hydrochlorothiazide 25 mg tab Test blood sugar 1-2 times daily Take Metformin XR 500 mg tab daily DASH Eating Plan DASH stands for "Dietary Approaches to Stop Hypertension." The DASH eating plan is a healthy eating plan that has been shown to reduce high blood pressure (hypertension). Additional health benefits may include reducing the risk of type 2 diabetes mellitus, heart disease, and stroke. The DASH eating plan may also help with weight loss. WHAT DO I NEED TO KNOW ABOUT THE DASH EATING PLAN? For the DASH eating plan, you will follow these general guidelines:  Choose foods with a percent daily value for sodium of less than 5% (as listed on the food label).  Use salt-free seasonings or herbs instead of table salt or sea salt.  Check with your health care provider or pharmacist before using salt substitutes.  Eat lower-sodium products, often labeled as "lower sodium" or "no salt added."  Eat fresh foods.  Eat more vegetables, fruits, and low-fat dairy products.  Choose whole grains. Look for the word "whole" as the first word in the ingredient list.  Choose fish and skinless chicken or [place] more often than red meat. Limit fish, poultry, and meat to 6 oz (170 g) each day.  Limit sweets, desserts, sugars, and sugary drinks.  Choose heart-healthy fats.  Limit cheese to 1 oz (28 g) per day.  Eat more home-cooked food and less restaurant, buffet, and fast food.  Limit fried foods.  Cook foods using methods other than frying.  Limit canned vegetables. If you do use them, rinse them well to decrease the sodium.  When eating at a restaurant, ask that your food be prepared with less salt, or no salt if possible. WHAT FOODS CAN I EAT? Seek help from a dietitian for individual calorie needs. Grains Whole grain or whole wheat bread. Brown rice. Whole grain or whole wheat pasta. Quinoa, bulgur, and whole grain cereals.  Low-sodium cereals. Corn or whole wheat flour tortillas. Whole grain cornbread. Whole grain crackers. Low-sodium crackers. Vegetables Fresh or frozen vegetables (raw, steamed, roasted, or grilled). Low-sodium or reduced-sodium tomato and vegetable juices. Low-sodium or reduced-sodium tomato sauce and paste. Low-sodium or reduced-sodium canned vegetables.  Fruits All fresh, canned (in natural juice), or frozen fruits. Meat and Other Protein Products Ground beef (85% or leaner), grass-fed beef, or beef trimmed of fat. Skinless chicken or [place]. Ground chicken or [place]. Pork trimmed of fat. All fish and seafood. Eggs. Dried beans, peas, or lentils. Unsalted nuts and seeds. Unsalted canned beans. Dairy Low-fat dairy products, such as skim or 1% milk, 2% or reduced-fat cheeses, low-fat ricotta or cottage cheese, or plain low-fat yogurt. Low-sodium or reduced-sodium cheeses. Fats and Oils Tub margarines without trans fats. Light or reduced-fat mayonnaise and salad dressings (reduced sodium). Avocado. Safflower, olive, or canola oils. Natural peanut or almond butter. Other Unsalted popcorn and pretzels. The items listed above may not be a complete list of recommended foods or beverages. Contact your dietitian for more options. WHAT FOODS ARE NOT RECOMMENDED? Grains White bread. White pasta. White rice. Refined cornbread. Bagels and croissants. Crackers that contain trans fat. Vegetables Creamed or fried vegetables. Vegetables in a cheese sauce. Regular canned vegetables. Regular canned tomato sauce and paste. Regular tomato and vegetable juices. Fruits Dried fruits. Canned fruit in light or heavy syrup. Fruit juice. Meat and Other Protein Products Fatty cuts of meat. Ribs, chicken wings, bacon, sausage, bologna, salami, chitterlings, fatback, hot dogs, bratwurst,  and packaged luncheon meats. Salted nuts and seeds. Canned beans with salt. Dairy Whole or 2% milk, cream, half-and-half, and cream  cheese. Whole-fat or sweetened yogurt. Full-fat cheeses or blue cheese. Nondairy creamers and whipped toppings. Processed cheese, cheese spreads, or cheese curds. Condiments Onion and garlic salt, seasoned salt, table salt, and sea salt. Canned and packaged gravies. Worcestershire sauce. Tartar sauce. Barbecue sauce. Teriyaki sauce. Soy sauce, including reduced sodium. Steak sauce. Fish sauce. Oyster sauce. Cocktail sauce. Horseradish. Ketchup and mustard. Meat flavorings and tenderizers. Bouillon cubes. Hot sauce. Tabasco sauce. Marinades. Taco seasonings. Relishes. Fats and Oils Butter, stick margarine, lard, shortening, ghee, and bacon fat. Coconut, palm kernel, or palm oils. Regular salad dressings. Other Pickles and olives. Salted popcorn and pretzels. The items listed above may not be a complete list of foods and beverages to avoid. Contact your dietitian for more information. WHERE CAN I FIND MORE INFORMATION? National Heart, Lung, and [employer] Institute: [employer].itwww.nhlbi.nih.gov/health/health-topics/topics/dash/ Document Released: 01/09/2011 Document Revised: 06/06/2013 Document Reviewed: 11/24/2012 [facility] Patient Information 2015 [place], [place]. This information is not intended to replace advice given to you by your health care provider. Make sure you discuss any questions you have with your health care provider. Type 2 Diabetes Mellitus Type 2 diabetes mellitus, often simply referred to as type 2 diabetes, is a long-lasting (chronic) disease. In type 2 diabetes, the pancreas does not make enough insulin (a hormone), the cells are less responsive to the insulin that is made (insulin resistance), or both. Normally, insulin moves sugars from food into the tissue cells. The tissue cells use the sugars for energy. The lack of insulin or the lack of normal response to insulin causes excess sugars to build up in the blood instead of going into the tissue cells. As a result, high blood sugar (hyperglycemia)  develops. The effect of high sugar (glucose) levels can cause many complications. Type 2 diabetes was also previously called adult-onset diabetes, but it can occur at any age.  RISK FACTORS  A person is predisposed to developing type 2 diabetes if someone in the family has the disease and also has one or more of the following primary risk factors:  Overweight.  An inactive lifestyle.  A history of consistently eating high-calorie foods. Maintaining a normal weight and regular physical activity can reduce the chance of developing type 2 diabetes. SYMPTOMS  A person with type 2 diabetes may not show symptoms initially. The symptoms of type 2 diabetes appear slowly. The symptoms include:  Increased thirst (polydipsia).  Increased urination (polyuria).  Increased urination during the night (nocturia).  Weight loss. This weight loss may be rapid.  Frequent, recurring infections.  Tiredness (fatigue).  Weakness.  Vision changes, such as blurred vision.  Fruity smell to your breath.  Abdominal pain.  Nausea or vomiting.  Cuts or bruises which are slow to heal.  Tingling or numbness in the hands or feet. DIAGNOSIS Type 2 diabetes is frequently not diagnosed until complications of diabetes are present. Type 2 diabetes is diagnosed when symptoms or complications are present and when blood glucose levels are increased. Your blood glucose level may be checked by one or more of the following blood tests:  A fasting blood glucose test. You will not be allowed to eat for at least 8 hours before a blood sample is taken.  A random blood glucose test. Your blood glucose is checked at any time of the day regardless of when you ate.  A hemoglobin A1c blood glucose test. A hemoglobin  A1c test provides information about blood glucose control over the previous 3 months.  An oral glucose tolerance test (OGTT). Your blood glucose is measured after you have not eaten (fasted) for 2 hours and  then after you drink a glucose-containing beverage. TREATMENT   You may need to take insulin or diabetes medicine daily to keep blood glucose levels in the desired range.  If you use insulin, you may need to adjust the dosage depending on the carbohydrates that you eat with each meal or snack. The treatment goal is to maintain the before meal blood sugar (preprandial glucose) level at 70-130 mg/dL. HOME CARE INSTRUCTIONS   Have your hemoglobin A1c level checked twice a year.  Perform daily blood glucose monitoring as directed by your health care provider.  Monitor urine ketones when you are ill and as directed by your health care provider.  Take your diabetes medicine or insulin as directed by your health care provider to maintain your blood glucose levels in the desired range.  Never run out of diabetes medicine or insulin. It is needed every day.  If you are using insulin, you may need to adjust the amount of insulin given based on your intake of carbohydrates. Carbohydrates can raise blood glucose levels but need to be included in your diet. Carbohydrates provide vitamins, minerals, and fiber which are an essential part of a healthy diet. Carbohydrates are found in fruits, vegetables, whole grains, dairy products, legumes, and foods containing added sugars.  Eat healthy foods. You should make an appointment to see a registered dietitian to help you create an eating plan that is right for you.  Lose weight if you are overweight.  Carry a medical alert card or wear your medical alert jewelry.  Carry a 15-gram carbohydrate snack with you at all times to treat low blood glucose (hypoglycemia). Some examples of 15-gram carbohydrate snacks include:  Glucose tablets, 3 or 4.  Glucose gel, 15-gram tube.  Raisins, 2 tablespoons (24 grams).  Jelly beans, 6.  Animal crackers, 8.  Regular pop, 4 ounces (120 mL).  Gummy treats, 9.  Recognize hypoglycemia. Hypoglycemia occurs with  blood glucose levels of 70 mg/dL and below. The risk for hypoglycemia increases when fasting or skipping meals, during or after intense exercise, and during sleep. Hypoglycemia symptoms can include:  Tremors or shakes.  Decreased ability to concentrate.  Sweating.  Increased heart rate.  Headache.  Dry mouth.  Hunger.  Irritability.  Anxiety.  Restless sleep.  Altered speech or coordination.  Confusion.  Treat hypoglycemia promptly. If you are alert and able to safely swallow, follow the 15:15 rule:  Take 15-20 grams of rapid-acting glucose or carbohydrate. Rapid-acting options include glucose gel, glucose tablets, or 4 ounces (120 mL) of fruit juice, regular soda, or low-fat milk.  Check your blood glucose level 15 minutes after taking the glucose.  Take 15-20 grams more of glucose if the repeat blood glucose level is still 70 mg/dL or below.  Eat a meal or snack within 1 hour once blood glucose levels return to normal.  Be alert to feeling very thirsty and urinating more frequently than usual, which are early signs of hyperglycemia. An early awareness of hyperglycemia allows for prompt treatment. Treat hyperglycemia as directed by your health care provider.  Engage in at least 150 minutes of moderate-intensity physical activity a week, spread over at least 3 days of the week or as directed by your health care provider. In addition, you should engage in resistance exercise  at least 2 times a week or as directed by your health care provider. Try to spend no more than 90 minutes at one time inactive.  Adjust your medicine and food intake as needed if you start a new exercise or sport.  Follow your sick-day plan anytime you are unable to eat or drink as usual.  Do not use any tobacco products including cigarettes, chewing tobacco, or electronic cigarettes. If you need help quitting, ask your health care provider.  Limit alcohol intake to no more than 1 drink per day for  nonpregnant women and 2 drinks per day for men. You should drink alcohol only when you are also eating food. Talk with your health care provider whether alcohol is safe for you. Tell your health care provider if you drink alcohol several times a week.  Keep all follow-up visits as directed by your health care provider. This is important.  Schedule an eye exam soon after the diagnosis of type 2 diabetes and then annually.  Perform daily skin and foot care. Examine your skin and feet daily for cuts, bruises, redness, nail problems, bleeding, blisters, or sores. A foot exam by a health care provider should be done annually.  Brush your teeth and gums at least twice a day and floss at least once a day. Follow up with your dentist regularly.  Share your diabetes management plan with your workplace or school.  Stay up-to-date with immunizations. It is recommended that people with diabetes who are over 721 years old get the pneumonia vaccine. In some cases, two separate shots may be given. Ask your health care provider if your pneumonia vaccination is up-to-date.  Learn to manage stress.  Obtain ongoing diabetes education and support as needed.  Participate in or seek rehabilitation as needed to maintain or improve independence and quality of life. Request a physical or occupational therapy referral if you are having foot or hand numbness, or difficulties with grooming, dressing, eating, or physical activity. SEEK MEDICAL CARE IF:   You are unable to eat food or drink fluids for more than 6 hours.  You have nausea and vomiting for more than 6 hours.  Your blood glucose level is over 240 mg/dL.  There is a change in mental status.  You develop an additional serious illness.  You have diarrhea for more than 6 hours.  You have been sick or have had a fever for a couple of days and are not getting better.  You have pain during any physical activity.  SEEK IMMEDIATE MEDICAL CARE IF:  You  have difficulty breathing.  You have moderate to large ketone levels. MAKE SURE YOU:  Understand these instructions.  Will watch your condition.  Will get help right away if you are not doing well or get worse. Document Released: 01/20/2005 Document Revised: 06/06/2013 Document Reviewed: 08/19/2011 [facility] StationExit[facility] Patient Information 2015 [place], [place]. This information is not intended to replace advice given to you by your health care provider. Make sure you discuss any questions you have with your health care provider.

## 2013-12-02 NOTE — Progress Notes (Unsigned)
Patient ID: Nicholaus CorollaGary Finkel, male   DOB: [birth year], 53 y.o.   MRN: [id number] Pt comes in for Diabetes teaching with instructions to use Meter device Pt is new onset DM Type 2. Prescribed Metformin 500 XR tab daily Diabetes teaching done on A1c,testing blood sugars,diet/exercise with health literature Pt verbalized understanding of machine with demonstration. States his mother and brother has Diabetes [id number] today Denies dizziness or headaches Explained to pt extensively the importance of taking medication as prescribed with exercise/diet wil help lower A1c,and control Diabetes. [facility] BP elevated today- 169/113 85 Spoke with provider to increase Losartan to 50 mg tab Pt to return in 2 weeks for nurse visit BP,CBG  Orvan SeenJill Rmani Kapusta,RN Supervisor

## 2013-12-16 ENCOUNTER — Ambulatory Visit: Payer: Self-pay | Attending: Internal Medicine | Admitting: *Deleted

## 2013-12-16 ENCOUNTER — Telehealth: Payer: Self-pay | Admitting: Internal Medicine

## 2013-12-16 VITALS — BP 154/90 | HR 84 | Temp 98.0°F | Wt 298.8 lb

## 2013-12-16 DIAGNOSIS — I10 Essential (primary) hypertension: Secondary | ICD-10-CM | POA: Insufficient documentation

## 2013-12-16 MED ORDER — LOSARTAN POTASSIUM 50 MG PO TABS: 75.0000 mg | ORAL_TABLET | Freq: Every day | ORAL | Status: DC

## 2013-12-16 NOTE — Telephone Encounter (Signed)
Pt's fiance called wanting to speak about pt's healthcare, please f/u.

## 2013-12-16 NOTE — Progress Notes (Signed)
Patient presents for BP check following increase of losartan to 50 mg daily States feeling well Brought blood sugar record AM CBGs ranging 76-150 PM CBGs ranging 68-168 Patient states he's been walking 30 minutes every day  BP 154/90 P 84 T 98.0 oral SPO2 97% Wt 298.8lb  Per PCP: Continue metformin as is Increase losartan to 75 mg daily Return in 2 weeks for nurse visit for BP check

## 2014-01-03 ENCOUNTER — Emergency Department (HOSPITAL_COMMUNITY): Payer: Self-pay

## 2014-01-03 ENCOUNTER — Encounter (HOSPITAL_COMMUNITY): Payer: Self-pay | Admitting: *Deleted

## 2014-01-03 ENCOUNTER — Emergency Department (HOSPITAL_COMMUNITY)
Admission: EM | Admit: 2014-01-03 | Discharge: 2014-01-04 | Disposition: A | Discharge status: Home / Self Care | Payer: Self-pay | Attending: Emergency Medicine | Admitting: Emergency Medicine

## 2014-01-03 ENCOUNTER — Ambulatory Visit: Payer: Self-pay | Attending: Internal Medicine | Admitting: *Deleted

## 2014-01-03 VITALS — BP 186/124 | HR 83 | Temp 98.5°F | Resp 18

## 2014-01-03 DIAGNOSIS — H538 Other visual disturbances: Secondary | ICD-10-CM | POA: Insufficient documentation

## 2014-01-03 DIAGNOSIS — E119 Type 2 diabetes mellitus without complications: Secondary | ICD-10-CM | POA: Insufficient documentation

## 2014-01-03 DIAGNOSIS — IMO0001 Reserved for inherently not codable concepts without codable children: Secondary | ICD-10-CM

## 2014-01-03 DIAGNOSIS — R030 Elevated blood-pressure reading, without diagnosis of hypertension: Secondary | ICD-10-CM

## 2014-01-03 DIAGNOSIS — Z7982 Long term (current) use of aspirin: Secondary | ICD-10-CM | POA: Insufficient documentation

## 2014-01-03 DIAGNOSIS — I10 Essential (primary) hypertension: Secondary | ICD-10-CM | POA: Insufficient documentation

## 2014-01-03 DIAGNOSIS — R519 Headache, unspecified: Secondary | ICD-10-CM

## 2014-01-03 DIAGNOSIS — Z79899 Other long term (current) drug therapy: Secondary | ICD-10-CM | POA: Insufficient documentation

## 2014-01-03 DIAGNOSIS — R51 Headache: Secondary | ICD-10-CM | POA: Insufficient documentation

## 2014-01-03 DIAGNOSIS — R1011 Right upper quadrant pain: Secondary | ICD-10-CM | POA: Insufficient documentation

## 2014-01-03 LAB — COMPREHENSIVE METABOLIC PANEL
ALT: 21 U/L (ref 0–53)
AST: 17 U/L (ref 0–37)
Albumin: 3.7 g/dL (ref 3.5–5.2)
Alkaline Phosphatase: 95 U/L (ref 39–117)
Anion gap: 12 (ref 5–15)
BUN: 11 mg/dL (ref 6–23)
CALCIUM: 9.5 mg/dL (ref 8.4–10.5)
CO2: 28 meq/L (ref 19–32)
CREATININE: 0.87 mg/dL (ref 0.50–1.35)
Chloride: 100 mEq/L (ref 96–112)
GFR calc Af Amer: >90 mL/min (ref >90)
GFR calc non Af Amer: >90 mL/min (ref >90)
Glucose, Bld: 95 mg/dL (ref 70–99)
Potassium: 4.4 mEq/L (ref 3.7–5.3)
SODIUM: 140 meq/L (ref 137–147)
TOTAL PROTEIN: 7.6 g/dL (ref 6.0–8.3)
Total Bilirubin: 0.3 mg/dL (ref 0.3–1.2)

## 2014-01-03 LAB — CBC
HCT: 43.7 % (ref 39.0–52.0)
Hemoglobin: 14.8 g/dL (ref 13.0–17.0)
MCH: 30.4 pg (ref 26.0–34.0)
MCHC: 33.9 g/dL (ref 30.0–36.0)
MCV: 89.7 fL (ref 78.0–100.0)
PLATELETS: 217 10*3/uL (ref 150–400)
RBC: 4.87 MIL/uL (ref 4.22–5.81)
RDW: 12.9 % (ref 11.5–15.5)
WBC: 9.9 10*3/uL (ref 4.0–10.5)

## 2014-01-03 LAB — LIPASE, BLOOD: Lipase: 23 U/L (ref 11–59)

## 2014-01-03 MED ORDER — CLONIDINE HCL 0.1 MG PO TABS
0.1000 mg | ORAL_TABLET | Freq: Once | ORAL | Status: AC
  Administered 2014-01-03: 0.1 mg via ORAL

## 2014-01-03 MED ORDER — LOSARTAN POTASSIUM 100 MG PO TABS: 100.0000 mg | ORAL_TABLET | Freq: Every day | ORAL | Status: DC

## 2014-01-03 NOTE — ED Provider Notes (Signed)
CSN: [id number]     Arrival date & time 01/03/14  1754 History   First MD Initiated Contact with Patient 01/03/14 2006     Chief Complaint  Patient presents with  . Hypertension    Jeffrey Marquez is a 53 y.o. male with a history of hypertension, diabetes and diverticulitis who presents to the emergency department with elevated blood pressures during his primary care visit today. Patient reports he went to a follow-up appointment at [facility] and was found to have elevated blood pressures in the 180/120s. Patient reports he is at his losartan for 1 day. Patient works today he has taken HCTZ 25 mg, losartan 75 mg, and Clonidine 0.1 mg x2 by his PCP.  Patient reports that today he's had a headache, blurry vision and tingling in his bilateral feet earlier today. Patient reports that his headache, tingling and blurry vision resolved shortly after arriving in the emergency department. Patient currently reports having right upper quadrant abdominal pain for the past hour that he rates it 6 out of 10. Pain has been intermittent for the past several weeks. Pain is slightly worse with movement. His pain does not seem to be related to eating. Patient denies current headache, blurry vision, or tingling in his feet. Patient denies fevers, chills, nausea, vomiting, diarrhea, constipation, weakness, changes to his speech, changes to his balance, cough, chest pain, shortness of breath, or palpitations.The patient denies history of MI, cardiac caths or stents. Patient denies history of stroke.   (Consider location/radiation/quality/duration/timing/severity/associated sxs/prior Treatment) HPI  Past Medical History  Diagnosis Date  . Diverticulitis   . Hypertension   . Diverticula of colon 2012  . Diabetes mellitus without complication    History reviewed. No pertinent past surgical history. Family History  Problem Relation Age of Onset  . Diabetes Mother   . Diabetes Brother    History   Substance Use Topics  . Smoking status: Never Smoker   . Smokeless tobacco: Never Used  . Alcohol Use: No    Review of Systems  Constitutional: Negative for fever, chills and appetite change.  HENT: Negative for congestion, ear pain, sinus pressure, sore throat and trouble swallowing.   Eyes: Negative for pain, discharge and redness.  Respiratory: Negative for cough, shortness of breath and wheezing.   Cardiovascular: Negative for chest pain, palpitations and leg swelling.  Gastrointestinal: Positive for abdominal pain. Negative for nausea, vomiting, diarrhea, constipation, blood in stool and abdominal distention.  Genitourinary: Negative for dysuria, frequency, hematuria and difficulty urinating.  Musculoskeletal: Negative for back pain and neck pain.  Skin: Negative for rash.  Neurological: Positive for headaches. Negative for dizziness, seizures, syncope, speech difficulty, weakness and numbness.  All other systems reviewed and are negative.     Allergies  Review of patient's allergies indicates no known allergies.  Home Medications   Prior to Admission medications   Medication Sig Start Date End Date Taking? Authorizing Provider  aspirin-acetaminophen-caffeine (EXCEDRIN MIGRAINE) [phone] MG per tablet Take 2 tablets by mouth every 6 (six) hours as needed for headache.   Yes Historical Provider, MD  atorvastatin (LIPITOR) 10 MG tablet Take 1 tablet (10 mg total) by mouth daily. 11/29/13  Yes Lance Bosch, NP  Bioflavonoid Products (BIOFLEX PO) Take 1 tablet by mouth daily.   Yes Historical Provider, MD  Blood Glucose Monitoring Suppl (TRUERESULT BLOOD GLUCOSE) W/DEVICE KIT Test blood sugar 1-2 times per day 12/02/13  Yes Lance Bosch, NP  glucosamine-chondroitin 500-400 MG tablet Take 2  tablets by mouth every morning.   Yes Historical Provider, MD  hydrochlorothiazide (HYDRODIURIL) 25 MG tablet Take 1 tablet (25 mg total) by mouth daily. For blood pressure 11/17/13  Yes  Lance Bosch, NP  Ibuprofen-Diphenhydramine Cit (ADVIL PM PO) Take 2 tablets by mouth at bedtime as needed (sleep).   Yes Historical Provider, MD  losartan (COZAAR) 100 MG tablet Take 1 tablet (100 mg total) by mouth daily. 01/03/14  Yes Lance Bosch, NP  metFORMIN (GLUCOPHAGE XR) 500 MG 24 hr tablet Take 1 tablet (500 mg total) by mouth daily with breakfast. 11/29/13  Yes Lance Bosch, NP  Multiple Vitamin (MULTIVITAMIN WITH MINERALS) TABS tablet Take 1 tablet by mouth daily.   Yes Historical Provider, MD  Omega-3 Fatty Acids (FISH OIL PO) Take 1 capsule by mouth daily.   Yes Historical Provider, MD  HYDROcodone-acetaminophen (NORCO/VICODIN) 5-325 MG per tablet Take 1-2 tablets by mouth every 6 (six) hours as needed for moderate pain or severe pain. Patient not taking: Reported on 01/03/2014 08/03/13   Elwyn Lade, PA-C  ondansetron (ZOFRAN) 4 MG tablet Take 1 tablet (4 mg total) by mouth every 6 (six) hours. Patient not taking: Reported on 01/03/2014 08/03/13   Kara Mead Merrell, PA-C   BP 173/106 mmHg  Pulse 70  Temp(Src) 97.7 F (36.5 C) (Oral)  Resp 30  Ht 5' 9.5" (1.765 m)  Wt 298 lb (135.172 kg)  BMI 43.39 kg/m2  SpO2 96% Physical Exam  Constitutional: He is oriented to person, place, and time. He appears well-developed and well-nourished. No distress.  HENT:  Head: Normocephalic and atraumatic.  Right Ear: External ear normal.  Left Ear: External ear normal.  Mouth/Throat: Oropharynx is clear and moist. No oropharyngeal exudate.  Eyes: Conjunctivae and EOM are normal. Pupils are equal, round, and reactive to light. Right eye exhibits no discharge. Left eye exhibits no discharge. No scleral icterus.  Neck: Normal range of motion. Neck supple.  Cardiovascular: Normal rate, regular rhythm, normal heart sounds and intact distal pulses.  Exam reveals no gallop and no friction rub.   No murmur heard. Pulmonary/Chest: Effort normal and breath sounds normal. No respiratory distress.  He has no wheezes. He has no rales.  Abdominal: Soft. Bowel sounds are normal. He exhibits no distension and no mass. There is tenderness. There is no rebound and no guarding.  Patient has some mild right upper quadrant tenderness to palpation. Patient's abdomen is soft. No rebound tenderness. Negative Rovsing sign. Negative psoas and obturator sign.  Musculoskeletal: He exhibits no edema.  Patient's strength is 5 out of 5 in his bilateral upper and lower extremities. Patient is able to ambulate without difficulty or assistance.  Lymphadenopathy:    He has no cervical adenopathy.  Neurological: He is alert and oriented to person, place, and time. No cranial nerve deficit. Coordination normal.  Cranial nerves II through XII are intact bilaterally. Patient's sensation is intact in his bilateral lower extremities. No pronator drift. Finger to nose intact bilaterally. Rapid alternating movements intact bilaterally. Speech is clear.   Skin: Skin is warm and dry. No rash noted. He is not diaphoretic. No erythema. No pallor.  Psychiatric: He has a normal mood and affect. His behavior is normal.  Nursing note and vitals reviewed.   ED Course  Procedures (including critical care time) Labs Review Labs Reviewed  CBC  COMPREHENSIVE METABOLIC PANEL  LIPASE, BLOOD    Imaging Review Ct Head Wo Contrast  01/03/2014   CLINICAL DATA:  Subacute onset of severe headache for several weeks. Blurred vision. Hypertension. Initial encounter.  EXAM: CT HEAD WITHOUT CONTRAST  TECHNIQUE: Contiguous axial images were obtained from the base of the skull through the vertex without intravenous contrast.  COMPARISON:  None.  FINDINGS: There is no evidence of acute infarction, mass lesion, or intra- or extra-axial hemorrhage on CT.  The posterior fossa, including the cerebellum, brainstem and fourth ventricle, is within normal limits. The third and lateral ventricles, and basal ganglia are unremarkable in appearance. The  cerebral hemispheres are symmetric in appearance, with normal gray-white differentiation. No mass effect or midline shift is seen.  There is no evidence of fracture; visualized osseous structures are unremarkable in appearance. The visualized portions of the orbits are within normal limits. The paranasal sinuses and mastoid air cells are well-aerated. No significant soft tissue abnormalities are seen.  IMPRESSION: Unremarkable noncontrast CT of the head.   Electronically Signed   By: Garald Balding M.D.   On: 01/03/2014 23:18   US Abdomen Limited Ruq  01/03/2014   CLINICAL DATA:  53 year old male with right upper quadrant pain  EXAM: US ABDOMEN LIMITED - RIGHT UPPER QUADRANT  COMPARISON:  None.  FINDINGS: Gallbladder:  No gallstones or wall thickening visualized. No sonographic Murphy sign noted.  Common bile duct:  Diameter: Within normal limits at 3.8 mm  Liver:  Echogenic hepatic parenchyma with coarsening of the echotexture. The adjacent renal parenchyma appears hypoechoic in comparison. There is focal sparing around the gallbladder fossa. Findings are consistent with hepatic steatosis. The main portal vein remains patent.  IMPRESSION: 1. No evidence of cholelithiasis or acute cholecystitis. 2. Hepatic steatosis.   Electronically Signed   By: Jacqulynn Cadet M.D.   On: 01/03/2014 22:44     EKG Interpretation   Date/Time:  Tuesday January 03 2014 18:16:02 EST Ventricular Rate:  76 PR Interval:  192 QRS Duration: 98 QT Interval:  412 QTC Calculation: 463 R Axis:   -38 Text Interpretation:  Normal sinus rhythm Left axis deviation Septal  infarct , age undetermined Abnormal ECG No significant change since last  tracing Confirmed by Christy Gentles  MD, Dana ([id number]) on 01/03/2014 6:21:52 PM     Filed Vitals:   01/03/14 2315 01/03/14 2330 01/03/14 2345 01/04/14 0000  BP: 171/105 171/112 170/119 173/106  Pulse: 73 73 72 70  Temp:      TempSrc:      Resp:      Height:      Weight:      SpO2:  95% 94%  96%    MDM   Meds given in ED:  Medications - No data to display  Discharge Medication List as of 01/04/2014 12:23 AM      Final diagnoses:  Headache  Essential hypertension  Right upper quadrant pain   Jeffrey Marquez is a 53 y.o. male with a history of hypertension, diabetes and diverticulitis who presents to the emergency department with elevated blood pressures during his primary care visit today. Patient reports that today he's had a headache, blurry vision and tingling in his bilateral feet earlier today, but these have resolved since arriving in the ED. Today the patient has had losartan 75 mg, clonidine 0.1 mg 2, and hydrochlorothiazide 25 mg. The patient's losartan has been increased to 100 mg once a day, by his PCP. He will start taking this dose tomorrow. Patient is now reporting right upper quadrant abdominal pain. He denies any nausea, vomiting, diarrhea or constipation.  Patient's CBC, CMP  and lipase were unremarkable. The patient's CT of his head without contrast was unremarkable. The patient's abdominal ultrasound was negative for cholelithiasis or acute cholecystitis. It did show hepatic steatosis.  Patient is afebrile and nontoxic appearing. The patient's blood pressure has come down since his initial evaluation. Patient is currently symptom free. Patient does not wish for any pain medicine for his abdominal pain. I advised the patient he needed to follow-up with his primary care provider tomorrow for a blood pressure check. Advised the patient he needed to return to the emergency department with new or worsening symptoms or new concerns. Patient verbalized understanding and agreement with plan.  The patient was discussed with and evaluated by Dr. Rogene Houston who agrees with assessment and plan.     Hanley Hays, PA-C 01/04/14 0201  Fredia Sorrow, MD 01/06/14 1843

## 2014-01-03 NOTE — Progress Notes (Signed)
Patient presents with fiance for BP check Med list reviewed; states taking all meds as directed however, reports missing yesterday's dose of losartan due to being out of med Restarted losartan 75 mg today C/o blurred vision for 2 days Denies headache, chest pain or pressure  BP 186/104  left arm manually with large cuff P 86 R 18 T 98.5 oral SPO2 94%  0.1 mg clonidine given po per protocol  Discussed importance of following low sodium diet Patient instructed to increase losartan to 100 mg daily and continue other meds as is per PCP  Make appt for nurse visit for BP check in 2 weeks   BP recheck 20 minutes after clonidine administration 184/124 left arm manually P 72 Continues to deny headache, chest pain or pressure  Per PCP: Give second dose of clonidine 0.1 mg po  BP recheck 30 minutes after second dose of clonidine 0.1 mg 186/124 P 73  Per PCP; [facility] Charge nurse, Virgie DadJessica Fiance will drive [facility]  Patient advised to call for med refills at least 7 days before running out so as not to go without. Patient aware that he is to f/u with PCP 3 months from last visit  (due 02/17/14)

## 2014-01-03 NOTE — ED Notes (Addendum)
Blurred vision, h/a's, tingling in feet; ran out of medication yesterday; did get meds refilled this morning. [facility] to [facility]; today, they increased the losartan. Did take clonidine 0.1 mg. No neurological deficits. Negative stroke.

## 2014-01-04 NOTE — Discharge Instructions (Signed)
Abdominal Pain °Many things can cause abdominal pain. Usually, abdominal pain is not caused by a disease and will improve without treatment. It can often be observed and treated at home. Your health care provider will do a physical exam and possibly order blood tests and X-rays to help determine the seriousness of your pain. However, in many cases, more time must pass before a clear cause of the pain can be found. Before that point, your health care provider may not know if you need more testing or further treatment. °HOME CARE INSTRUCTIONS  °Monitor your abdominal pain for any changes. The following actions may help to alleviate any discomfort you are experiencing: °· Only take over-the-counter or prescription medicines as directed by your health care provider. °· Do not take laxatives unless directed to do so by your health care provider. °· Try a clear liquid diet (broth, tea, or water) as directed by your health care provider. Slowly move to a bland diet as tolerated. °SEEK MEDICAL CARE IF: °· You have unexplained abdominal pain. °· You have abdominal pain associated with nausea or diarrhea. °· You have pain when you urinate or have a bowel movement. °· You experience abdominal pain that wakes you in the night. °· You have abdominal pain that is worsened or improved by eating food. °· You have abdominal pain that is worsened with eating fatty foods. °· You have a fever. °SEEK IMMEDIATE MEDICAL CARE IF:  °· Your pain does not go away within 2 hours. °· You keep throwing up (vomiting). °· Your pain is felt only in portions of the abdomen, such as the right side or the left lower portion of the abdomen. °· You pass bloody or black tarry stools. °MAKE SURE YOU: °· Understand these instructions.   °· Will watch your condition.   °· Will get help right away if you are not doing well or get worse.   °Document Released: 10/30/2004 Document Revised: 01/25/2013 Document Reviewed: 09/29/2012 °[employer]® Patient Information  ©2015 [employer]. This information is not intended to replace advice given to you by your health care provider. Make sure you discuss any questions you have with your health care provider. ° °Hypertension °Hypertension, commonly called high blood pressure, is when the force of blood pumping through your arteries is too strong. Your arteries are the blood vessels that carry blood from your heart throughout your body. A blood pressure reading consists of a higher number over a lower number, such as 110/72. The higher number (systolic) is the pressure inside your arteries when your heart pumps. The lower number (diastolic) is the pressure inside your arteries when your heart relaxes. Ideally you want your blood pressure below 120/80. °Hypertension forces your heart to work harder to pump blood. Your arteries may become narrow or stiff. Having hypertension puts you at risk for heart disease, stroke, and other problems.  °RISK FACTORS °Some risk factors for high blood pressure are controllable. Others are not.  °Risk factors you cannot control include:  °· Race. You may be at higher risk if you are African American. °· Age. Risk increases with age. °· Gender. Men are at higher risk than women before age 45 years. After age 65, women are at higher risk than men. °Risk factors you can control include: °· Not getting enough exercise or physical activity. °· Being overweight. °· Getting too much fat, sugar, calories, or salt in your diet. °· Drinking too much alcohol. °SIGNS AND SYMPTOMS °Hypertension does not usually cause signs or symptoms. Extremely high   blood pressure (hypertensive crisis) may cause headache, anxiety, shortness of breath, and nosebleed. °DIAGNOSIS  °To check if you have hypertension, your health care provider will measure your blood pressure while you are seated, with your arm held at the level of your heart. It should be measured at least twice using the same arm. Certain conditions can cause a  difference in blood pressure between your right and left arms. A blood pressure reading that is higher than normal on one occasion does not mean that you need treatment. If one blood pressure reading is high, ask your health care provider about having it checked again. °TREATMENT  °Treating high blood pressure includes making lifestyle changes and possibly taking medicine. Living a healthy lifestyle can help lower high blood pressure. You may need to change some of your habits. °Lifestyle changes may include: °· Following the DASH diet. This diet is high in fruits, vegetables, and whole grains. It is low in salt, red meat, and added sugars. °· Getting at least 2½ hours of brisk physical activity every week. °· Losing weight if necessary. °· Not smoking. °· Limiting alcoholic beverages. °· Learning ways to reduce stress. ° If lifestyle changes are not enough to get your blood pressure under control, your health care provider may prescribe medicine. You may need to take more than one. Work closely with your health care provider to understand the risks and benefits. °HOME CARE INSTRUCTIONS °· Have your blood pressure rechecked as directed by your health care provider.   °· Take medicines only as directed by your health care provider. Follow the directions carefully. Blood pressure medicines must be taken as prescribed. The medicine does not work as well when you skip doses. Skipping doses also puts you at risk for problems.   °· Do not smoke.   °· Monitor your blood pressure at home as directed by your health care provider.  °SEEK MEDICAL CARE IF:  °· You think you are having a reaction to medicines taken. °· You have recurrent headaches or feel dizzy. °· You have swelling in your ankles. °· You have trouble with your vision. °SEEK IMMEDIATE MEDICAL CARE IF: °· You develop a severe headache or confusion. °· You have unusual weakness, numbness, or feel faint. °· You have severe chest or abdominal pain. °· You vomit  repeatedly. °· You have trouble breathing. °MAKE SURE YOU:  °· Understand these instructions. °· Will watch your condition. °· Will get help right away if you are not doing well or get worse. °Document Released: 01/20/2005 Document Revised: 06/06/2013 Document Reviewed: 11/12/2012 °[employer]® Patient Information ©2015 [employer]. This information is not intended to replace advice given to you by your health care provider. Make sure you discuss any questions you have with your health care provider. ° °

## 2014-01-05 ENCOUNTER — Encounter: Payer: Self-pay | Admitting: Internal Medicine

## 2014-01-05 ENCOUNTER — Ambulatory Visit: Payer: Self-pay | Attending: Internal Medicine | Admitting: Internal Medicine

## 2014-01-05 VITALS — BP 168/121 | HR 71 | Temp 98.6°F | Resp 18 | Wt 301.0 lb

## 2014-01-05 DIAGNOSIS — I10 Essential (primary) hypertension: Secondary | ICD-10-CM | POA: Insufficient documentation

## 2014-01-05 DIAGNOSIS — E119 Type 2 diabetes mellitus without complications: Secondary | ICD-10-CM | POA: Insufficient documentation

## 2014-01-05 MED ORDER — HYDRALAZINE HCL 10 MG PO TABS: 10.0000 mg | ORAL_TABLET | Freq: Three times a day (TID) | ORAL | Status: DC

## 2014-01-05 MED ORDER — CLONIDINE HCL 0.1 MG PO TABS
0.2000 mg | ORAL_TABLET | Freq: Once | ORAL | Status: AC
Start: 2014-01-05 — End: 2014-01-05
  Administered 2014-01-05: 0.2 mg via ORAL

## 2014-01-05 NOTE — Patient Instructions (Signed)
DASH Eating Plan °DASH stands for "Dietary Approaches to Stop Hypertension." The DASH eating plan is a healthy eating plan that has been shown to reduce high blood pressure (hypertension). Additional health benefits may include reducing the risk of type 2 diabetes mellitus, heart disease, and stroke. The DASH eating plan may also help with weight loss. °WHAT DO I NEED TO KNOW ABOUT THE DASH EATING PLAN? °For the DASH eating plan, you will follow these general guidelines: °· Choose foods with a percent daily value for sodium of less than 5% (as listed on the food label). °· Use salt-free seasonings or herbs instead of table salt or sea salt. °· Check with your health care provider or pharmacist before using salt substitutes. °· Eat lower-sodium products, often labeled as "lower sodium" or "no salt added." °· Eat fresh foods. °· Eat more vegetables, fruits, and low-fat dairy products. °· Choose whole grains. Look for the word "whole" as the first word in the ingredient list. °· Choose fish and skinless chicken or turkey more often than red meat. Limit fish, poultry, and meat to 6 oz (170 g) each day. °· Limit sweets, desserts, sugars, and sugary drinks. °· Choose heart-healthy fats. °· Limit cheese to 1 oz (28 g) per day. °· Eat more home-cooked food and less restaurant, buffet, and fast food. °· Limit fried foods. °· Cook foods using methods other than frying. °· Limit canned vegetables. If you do use them, rinse them well to decrease the sodium. °· When eating at a restaurant, ask that your food be prepared with less salt, or no salt if possible. °WHAT FOODS CAN I EAT? °Seek help from a dietitian for individual calorie needs. °Grains °Whole grain or whole wheat bread. Brown rice. Whole grain or whole wheat pasta. Quinoa, bulgur, and whole grain cereals. Low-sodium cereals. Corn or whole wheat flour tortillas. Whole grain cornbread. Whole grain crackers. Low-sodium crackers. °Vegetables °Fresh or frozen vegetables  (raw, steamed, roasted, or grilled). Low-sodium or reduced-sodium tomato and vegetable juices. Low-sodium or reduced-sodium tomato sauce and paste. Low-sodium or reduced-sodium canned vegetables.  °Fruits °All fresh, canned (in natural juice), or frozen fruits. °Meat and Other Protein Products °Ground beef (85% or leaner), grass-fed beef, or beef trimmed of fat. Skinless chicken or turkey. Ground chicken or turkey. Pork trimmed of fat. All fish and seafood. Eggs. Dried beans, peas, or lentils. Unsalted nuts and seeds. Unsalted canned beans. °Dairy °Low-fat dairy products, such as skim or 1% milk, 2% or reduced-fat cheeses, low-fat ricotta or cottage cheese, or plain low-fat yogurt. Low-sodium or reduced-sodium cheeses. °Fats and Oils °Tub margarines without trans fats. Light or reduced-fat mayonnaise and salad dressings (reduced sodium). Avocado. Safflower, olive, or canola oils. Natural peanut or almond butter. °Other °Unsalted popcorn and pretzels. °The items listed above may not be a complete list of recommended foods or beverages. Contact your dietitian for more options. °WHAT FOODS ARE NOT RECOMMENDED? °Grains °White bread. White pasta. White rice. Refined cornbread. Bagels and croissants. Crackers that contain trans fat. °Vegetables °Creamed or fried vegetables. Vegetables in a cheese sauce. Regular canned vegetables. Regular canned tomato sauce and paste. Regular tomato and vegetable juices. °Fruits °Dried fruits. Canned fruit in light or heavy syrup. Fruit juice. °Meat and Other Protein Products °Fatty cuts of meat. Ribs, chicken wings, bacon, sausage, bologna, salami, chitterlings, fatback, hot dogs, bratwurst, and packaged luncheon meats. Salted nuts and seeds. Canned beans with salt. °Dairy °Whole or 2% milk, cream, half-and-half, and cream cheese. Whole-fat or sweetened yogurt. Full-fat   cheeses or blue cheese. Nondairy creamers and whipped toppings. Processed cheese, cheese spreads, or cheese  curds. °Condiments °Onion and garlic salt, seasoned salt, table salt, and sea salt. Canned and packaged gravies. Worcestershire sauce. Tartar sauce. Barbecue sauce. Teriyaki sauce. Soy sauce, including reduced sodium. Steak sauce. Fish sauce. Oyster sauce. Cocktail sauce. Horseradish. Ketchup and mustard. Meat flavorings and tenderizers. Bouillon cubes. Hot sauce. Tabasco sauce. Marinades. Taco seasonings. Relishes. °Fats and Oils °Butter, stick margarine, lard, shortening, ghee, and bacon fat. Coconut, palm kernel, or palm oils. Regular salad dressings. °Other °Pickles and olives. Salted popcorn and pretzels. °The items listed above may not be a complete list of foods and beverages to avoid. Contact your dietitian for more information. °WHERE CAN I FIND MORE INFORMATION? °National Heart, Lung, and [employer] Institute: www.nhlbi.nih.gov/health/health-topics/topics/dash/ °Document Released: 01/09/2011 Document Revised: 06/06/2013 Document Reviewed: 11/24/2012 °ExitCare® Patient Information ©2015 [employer]. This information is not intended to replace advice given to you by your health care provider. Make sure you discuss any questions you have with your health care provider. ° °

## 2014-01-05 NOTE — Progress Notes (Signed)
F/U HTN DM Stated was in the emergency room due to elevated BP Pt took BP medication at 6:00am Glucose been running good, 92mg /dL this moning

## 2014-01-05 NOTE — Progress Notes (Signed)
Patient ID: Jeffrey Marquez, male   DOB: 07-13-1960, 53 y.o.   MRN: [id number]  CC: HTN  HPI:  Patient presents to clinic today for a follow up of hypertension.  He was sent to the ER yesterday for elevated blood pressure after being given 0.2 mg of Clonidine.  He states that he has been taking his losartan and hydrochlorothiazide every morning.  He has continued to have elevated pressures.  He is in the process of getting a BP cuff.  He states that he was able to obtain an erection this morning to have intercourse.   No Known Allergies Past Medical History  Diagnosis Date  . Diverticulitis   . Hypertension   . Diverticula of colon 2012  . Diabetes mellitus without complication    Current Outpatient Prescriptions on File Prior to Visit  Medication Sig Dispense Refill  . aspirin-acetaminophen-caffeine (EXCEDRIN MIGRAINE) 250-250-65 MG per tablet Take 2 tablets by mouth every 6 (six) hours as needed for headache.    [name] atorvastatin (LIPITOR) 10 MG tablet Take 1 tablet (10 mg total) by mouth daily. 90 tablet 3  . Blood Glucose Monitoring Suppl (TRUERESULT BLOOD GLUCOSE) W/DEVICE KIT Test blood sugar 1-2 times per day 1 each 0  . glucosamine-chondroitin 500-400 MG tablet Take 2 tablets by mouth every morning.    . hydrochlorothiazide (HYDRODIURIL) 25 MG tablet Take 1 tablet (25 mg total) by mouth daily. For blood pressure 30 tablet 3  . Ibuprofen-Diphenhydramine Cit (ADVIL PM PO) Take 2 tablets by mouth at bedtime as needed (sleep).    . losartan (COZAAR) 100 MG tablet Take 1 tablet (100 mg total) by mouth daily. 30 tablet 2  . metFORMIN (GLUCOPHAGE XR) 500 MG 24 hr tablet Take 1 tablet (500 mg total) by mouth daily with breakfast. 60 tablet 2  . Multiple Vitamin (MULTIVITAMIN WITH MINERALS) TABS tablet Take 1 tablet by mouth daily.    . Omega-3 Fatty Acids (FISH OIL PO) Take 1 capsule by mouth daily.    . ondansetron (ZOFRAN) 4 MG tablet Take 1 tablet (4 mg total) by mouth every 6 (six) hours. 12  tablet 0  . Bioflavonoid Products (BIOFLEX PO) Take 1 tablet by mouth daily.    [name] HYDROcodone-acetaminophen (NORCO/VICODIN) 5-325 MG per tablet Take 1-2 tablets by mouth every 6 (six) hours as needed for moderate pain or severe pain. (Patient not taking: Reported on 01/03/2014) 10 tablet 0   No current facility-administered medications on file prior to visit.   Family History  Problem Relation Age of Onset  . Diabetes Mother   . Diabetes Brother    History   Social History  . Marital Status: Divorced    Spouse Name: N/A    Number of Children: N/A  . Years of Education: N/A   Occupational History  . Not on file.   Social History Main Topics  . Smoking status: Never Smoker   . Smokeless tobacco: Never Used  . Alcohol Use: No  . Drug Use: No  . Sexual Activity: Not on file   Other Topics Concern  . Not on file   Social History Narrative    Review of Systems  Eyes: Positive for blurred vision.  Respiratory: Negative.   Cardiovascular: Positive for leg swelling. Negative for chest pain and palpitations.  Neurological: Positive for headaches (often). Negative for dizziness and tingling.       Objective:   Filed Vitals:   01/05/14 1437  BP: 185/108  Pulse: 85  Temp: 98.6 F (  37 C)  Resp: 18    Physical Exam  Constitutional: He is oriented to person, place, and time.  Cardiovascular: Normal rate, regular rhythm and normal heart sounds.   Pulmonary/Chest: Effort normal and breath sounds normal.  Musculoskeletal: Normal range of motion. He exhibits no edema.  Neurological: He is alert and oriented to person, place, and time.  Skin: Skin is warm and dry.     Lab Results  Component Value Date   WBC 9.9 01/03/2014   HGB 14.8 01/03/2014   HCT 43.7 01/03/2014   MCV 89.7 01/03/2014   PLT 217 01/03/2014   Lab Results  Component Value Date   CREATININE 0.87 01/03/2014   BUN 11 01/03/2014   NA 140 01/03/2014   K 4.4 01/03/2014   CL 100 01/03/2014   CO2 28  01/03/2014    Lab Results  Component Value Date   HGBA1C 6.5* 11/18/2013   Lipid Panel     Component Value Date/Time   CHOL 235* 11/18/2013 0929   TRIG 306* 11/18/2013 0929   HDL 35* 11/18/2013 0929   CHOLHDL 6.7 11/18/2013 0929   VLDL 61* 11/18/2013 0929   LDLCALC 139* 11/18/2013 0929       Assessment and plan:   Jeffrey Marquez was seen today for establish care and follow-up.  Diagnoses and associated orders for this visit:  Accelerated hypertension - cloNIDine (CATAPRES) tablet 0.2 mg; Take 2 tablets (0.2 mg total) by mouth once. - Begin hydrALAZINE (APRESOLINE) 10 MG tablet; Take 1 tablet (10 mg total) by mouth 3 (three) times daily. Patient blood pressure remains elevated today, will add addition BP medication and have patient to return in 2 weeks for blood pressure recheck with nurse. Stressed diet changes, regular exercise regimen, and modifiable risk factors. Will follow up with CMP as needed, Will follow up with patient in 3-6 months.    Return in about 2 weeks (around 01/19/2014) for Nurse Visit-BP check.        Chari Manning, NP-C [facility] [phone] 01/05/2014, 3:08 PM

## 2014-01-06 ENCOUNTER — Emergency Department (HOSPITAL_COMMUNITY)
Admission: EM | Admit: 2014-01-06 | Discharge: 2014-01-06 | Disposition: A | Discharge status: Home / Self Care | Payer: Self-pay | Attending: Emergency Medicine | Admitting: Emergency Medicine

## 2014-01-06 ENCOUNTER — Encounter (HOSPITAL_COMMUNITY): Payer: Self-pay | Admitting: Emergency Medicine

## 2014-01-06 DIAGNOSIS — E669 Obesity, unspecified: Secondary | ICD-10-CM | POA: Insufficient documentation

## 2014-01-06 DIAGNOSIS — E119 Type 2 diabetes mellitus without complications: Secondary | ICD-10-CM | POA: Insufficient documentation

## 2014-01-06 DIAGNOSIS — I10 Essential (primary) hypertension: Secondary | ICD-10-CM | POA: Insufficient documentation

## 2014-01-06 DIAGNOSIS — Z8719 Personal history of other diseases of the digestive system: Secondary | ICD-10-CM | POA: Insufficient documentation

## 2014-01-06 DIAGNOSIS — Z79899 Other long term (current) drug therapy: Secondary | ICD-10-CM | POA: Insufficient documentation

## 2014-01-06 HISTORY — DX: Obesity, unspecified: E66.9

## 2014-01-06 LAB — CBC WITH DIFFERENTIAL/PLATELET
Basophils Absolute: 0 10*3/uL (ref 0.0–0.1)
Basophils Relative: 0 % (ref 0–1)
EOS PCT: 2 % (ref 0–5)
Eosinophils Absolute: 0.2 10*3/uL (ref 0.0–0.7)
HEMATOCRIT: 41.4 % (ref 39.0–52.0)
Hemoglobin: 13.6 g/dL (ref 13.0–17.0)
LYMPHS ABS: 2.4 10*3/uL (ref 0.7–4.0)
Lymphocytes Relative: 23 % (ref 12–46)
MCH: 29.1 pg (ref 26.0–34.0)
MCHC: 32.9 g/dL (ref 30.0–36.0)
MCV: 88.7 fL (ref 78.0–100.0)
MONO ABS: 0.8 10*3/uL (ref 0.1–1.0)
Monocytes Relative: 8 % (ref 3–12)
Neutro Abs: 7.2 10*3/uL (ref 1.7–7.7)
Neutrophils Relative %: 67 % (ref 43–77)
PLATELETS: 228 10*3/uL (ref 150–400)
RBC: 4.67 MIL/uL (ref 4.22–5.81)
RDW: 13.1 % (ref 11.5–15.5)
WBC: 10.6 10*3/uL — ABNORMAL HIGH (ref 4.0–10.5)

## 2014-01-06 LAB — COMPREHENSIVE METABOLIC PANEL
ALT: 18 U/L (ref 0–53)
ANION GAP: 12 (ref 5–15)
AST: 15 U/L (ref 0–37)
Albumin: 3.6 g/dL (ref 3.5–5.2)
Alkaline Phosphatase: 94 U/L (ref 39–117)
BUN: 19 mg/dL (ref 6–23)
CALCIUM: 9.6 mg/dL (ref 8.4–10.5)
CO2: 28 mEq/L (ref 19–32)
CREATININE: 1.09 mg/dL (ref 0.50–1.35)
Chloride: 100 mEq/L (ref 96–112)
GFR, EST AFRICAN AMERICAN: 89 mL/min — AB (ref >90)
GFR, EST NON AFRICAN AMERICAN: 77 mL/min — AB (ref >90)
GLUCOSE: 111 mg/dL — AB (ref 70–99)
Potassium: 3.9 mEq/L (ref 3.7–5.3)
Sodium: 140 mEq/L (ref 137–147)
TOTAL PROTEIN: 7.4 g/dL (ref 6.0–8.3)
Total Bilirubin: <0.2 mg/dL — ABNORMAL LOW (ref 0.3–1.2)

## 2014-01-06 MED ORDER — ACETAMINOPHEN 325 MG PO TABS
650.0000 mg | ORAL_TABLET | Freq: Once | ORAL | Status: AC
  Administered 2014-01-06: 650 mg via ORAL

## 2014-01-06 NOTE — ED Notes (Signed)
SpO2 was checked, after pt was ambulated. Pt initial SpO2 result after ambulating read 88 and then stayed steady at 92.

## 2014-01-06 NOTE — ED Provider Notes (Signed)
CSN: [id number]     Arrival date & time 01/06/14  2146 History  This chart was scribed for Sharyon Cable, MD by Starleen Arms, ED Scribe. This patient was seen in room A03C/A03C and the patient's care was started at 11:11 PM.   Chief Complaint  Patient presents with  . Hypertension   HPI HPI Comments: Jeffrey Marquez is a 53 y.o. male with a history of DM and hypertension who presents to the Emergency Department complaining of an acute exacerbation of chronic hypertension.  Patient reports he was sent to the ED on 12/1 after a persistent BP in the 180s/120s despite receiving 0.2 mg clonidine at his PCP's office.  Patient reports he checked his blood pressure this evening and it was persistently in the low 200s/130s.  He consulted an on-call nurse who advised him to go the ED after repeated measurements showed no change.  Patient is prescribed 100 mg Losartin once daily and 10 mg hydralazine 3 times daily.  He has been compliant.  Patient also complains of an associated gradual onset headache currently.  Patient denies a history of MI or CVA.  Patient denies drug or alcohol use.  Patient denies tobacco use.  Patient denies blurred vision, fever, vomiting, CP, SOB, slurred speech, extremity weakness.  Past Medical History  Diagnosis Date  . Diverticulitis   . Hypertension   . Diverticula of colon 2012  . Diabetes mellitus without complication   . Obesity    History reviewed. No pertinent past surgical history. Family History  Problem Relation Age of Onset  . Diabetes Mother   . Diabetes Brother    History  Substance Use Topics  . Smoking status: Never Smoker   . Smokeless tobacco: Never Used  . Alcohol Use: No    Review of Systems  Constitutional: Negative for fever.  Eyes: Negative for visual disturbance.  Respiratory: Negative for shortness of breath.   Cardiovascular: Negative for chest pain.  Gastrointestinal: Negative for vomiting.  Neurological: Negative for speech  difficulty, weakness, light-headedness and headaches.  All other systems reviewed and are negative.     Allergies  Review of patient's allergies indicates no known allergies.  Home Medications   Prior to Admission medications   Medication Sig Start Date End Date Taking? Authorizing Provider  aspirin-acetaminophen-caffeine (EXCEDRIN MIGRAINE) [phone] MG per tablet Take 2 tablets by mouth every 6 (six) hours as needed for headache.    Historical Provider, MD  atorvastatin (LIPITOR) 10 MG tablet Take 1 tablet (10 mg total) by mouth daily. 11/29/13   Lance Bosch, NP  Bioflavonoid Products (BIOFLEX PO) Take 1 tablet by mouth daily.    Historical Provider, MD  Blood Glucose Monitoring Suppl (TRUERESULT BLOOD GLUCOSE) W/DEVICE KIT Test blood sugar 1-2 times per day 12/02/13   Lance Bosch, NP  glucosamine-chondroitin 500-400 MG tablet Take 2 tablets by mouth every morning.    Historical Provider, MD  hydrALAZINE (APRESOLINE) 10 MG tablet Take 1 tablet (10 mg total) by mouth 3 (three) times daily. 01/05/14   Lance Bosch, NP  hydrochlorothiazide (HYDRODIURIL) 25 MG tablet Take 1 tablet (25 mg total) by mouth daily. For blood pressure 11/17/13   Lance Bosch, NP  HYDROcodone-acetaminophen (NORCO/VICODIN) 5-325 MG per tablet Take 1-2 tablets by mouth every 6 (six) hours as needed for moderate pain or severe pain. Patient not taking: Reported on 01/03/2014 08/03/13   Elwyn Lade, PA-C  Ibuprofen-Diphenhydramine Cit (ADVIL PM PO) Take 2 tablets by mouth at bedtime  as needed (sleep).    Historical Provider, MD  losartan (COZAAR) 100 MG tablet Take 1 tablet (100 mg total) by mouth daily. 01/03/14   Lance Bosch, NP  metFORMIN (GLUCOPHAGE XR) 500 MG 24 hr tablet Take 1 tablet (500 mg total) by mouth daily with breakfast. 11/29/13   Lance Bosch, NP  Multiple Vitamin (MULTIVITAMIN WITH MINERALS) TABS tablet Take 1 tablet by mouth daily.    Historical Provider, MD  Omega-3 Fatty Acids (FISH  OIL PO) Take 1 capsule by mouth daily.    Historical Provider, MD  ondansetron (ZOFRAN) 4 MG tablet Take 1 tablet (4 mg total) by mouth every 6 (six) hours. 08/03/13   Elwyn Lade, PA-C   BP 183/110 mmHg  Pulse 80  Temp(Src) 97.6 F (36.4 C) (Oral)  Resp 24  Ht 5' 9.5" (1.765 m)  Wt 303 lb (137.44 kg)  BMI 44.12 kg/m2  SpO2 94% Physical Exam  Nursing note and vitals reviewed.  CONSTITUTIONAL: Well developed/well nourished HEAD: Normocephalic/atraumatic EYES: EOMI/PERRL, no nystagmus, no ptosis ENMT: Mucous membranes moist NECK: supple no meningeal signs, no bruits CV: S1/S2 noted, no murmurs/rubs/gallops noted LUNGS: Lungs are clear to auscultation bilaterally, no apparent distress ABDOMEN: soft, nontender, no rebound or guarding. He is obese GU:no cva tenderness NEURO:Awake/alert, facies symmetric, no arm or leg drift is noted Equal 5/5 strength with shoulder abduction, elbow flex/extension, wrist flex/extension in upper extremities and equal hand grips bilaterally Equal 5/5 strength with hip flexion,knee flex/extension, foot dorsi/plantar flexion Cranial nerves 3/4/5/6/08/11/08/11/12 tested and intact Gait normal without ataxia No past pointing Sensation to light touch intact in all extremities EXTREMITIES: pulses normal, full ROM SKIN: warm, color normal PSYCH: no abnormalities of mood noted   ED Course  Procedures  DIAGNOSTIC STUDIES: Oxygen Saturation is 94% on RA, adequate by my interpretation.    COORDINATION OF CARE:  11:28 PM Discussed treatment plan with patient at bedside.  Patient acknowledges and agrees with plan.    Pt reports feeling well - he reports he "feels like a spring chicken" He denies CP/SOB/weakness He ambulated and felt well.  He denied dyspnea on exertion (he had brief drops in O2 sat, that resolved, lung sounds clear) Advised to continue his meds (he just started hydralazine) check BP at least once/daily and call PCP on Monday with reading  to see if he needs to increase He is agreeable with plan We discussed strict return precautions   Labs Review Labs Reviewed  CBC WITH DIFFERENTIAL - Abnormal; Notable for the following:    WBC 10.6 (*)    All other components within normal limits  COMPREHENSIVE METABOLIC PANEL - Abnormal; Notable for the following:    Glucose, Bld 111 (*)    Total Bilirubin <0.2 (*)    GFR calc non Af Amer 77 (*)    GFR calc Af Amer 89 (*)    All other components within normal limits     MDM   Final diagnoses:  Essential hypertension    Nursing notes including past medical history and social history reviewed and considered in documentation Labs/vital reviewed myself and considered during evaluation Previous records reviewed and considered    I personally performed the services described in this documentation, which was scribed in my presence. The recorded information has been reviewed and is accurate.      Sharyon Cable, MD 01/07/14 [phone]

## 2014-01-06 NOTE — ED Notes (Signed)
Pt. Refused wheelchair 

## 2014-01-06 NOTE — ED Notes (Signed)
Pt. reports elevated blood pressure at home this evening 235/134 , denies pain / respirations unlabored.

## 2014-01-06 NOTE — Discharge Instructions (Signed)

## 2014-01-10 ENCOUNTER — Encounter: Payer: Self-pay | Admitting: Nurse Practitioner

## 2014-01-10 ENCOUNTER — Ambulatory Visit (INDEPENDENT_AMBULATORY_CARE_PROVIDER_SITE_OTHER): Payer: Self-pay | Admitting: Cardiovascular Disease

## 2014-01-10 ENCOUNTER — Encounter: Payer: Self-pay | Admitting: Cardiovascular Disease

## 2014-01-10 VITALS — BP 180/130 | HR 92 | Ht 69.5 in | Wt 303.0 lb

## 2014-01-10 DIAGNOSIS — I10 Essential (primary) hypertension: Secondary | ICD-10-CM | POA: Insufficient documentation

## 2014-01-10 MED ORDER — CARVEDILOL 6.25 MG PO TABS
6.2500 mg | ORAL_TABLET | Freq: Two times a day (BID) | ORAL | Status: DC
Start: 2014-01-10 — End: 2014-01-13

## 2014-01-10 MED ORDER — HYDROCHLOROTHIAZIDE 25 MG PO TABS: 25.0000 mg | ORAL_TABLET | Freq: Every day | ORAL | Status: DC

## 2014-01-10 MED ORDER — POTASSIUM CHLORIDE ER 20 MEQ PO TBCR: 20.0000 meq | EXTENDED_RELEASE_TABLET | Freq: Every day | ORAL | Status: DC

## 2014-01-10 NOTE — Assessment & Plan Note (Signed)
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 BP improved while he was in the office.  He has had marked HTN for years but ignored the problem for years. He needs to start on HCTZ .and Kdur Will also start Coreg 6.25 BID  May need to have hydralazine increased He does well with clonidine so we will also consider adding that if his BP remains elevated.  Will see him in 3 days to see how his BP is doing.

## 2014-01-10 NOTE — Patient Instructions (Addendum)
Your physician has recommended you make the following change in your medication:  START Carvedilol (Coreg) 6.25 mg twice daily - take 12 hours apart RESTART HCTZ (Hydrochlorothiazide) 25 mg once daily RESTART Klor (potassium supplement) 20 meq once daily   Your physician has requested that you have an echocardiogram. Echocardiography is a painless test that uses sound waves to create images of your heart. It provides your doctor with information about the size and shape of your heart and how well your heart's chambers and valves are working. This procedure takes approximately one hour. There are no restrictions for this procedure.  Your physician recommends that you follow-up with Dr. Elease HashimotoNahser on Friday, December 11 @ 1:30

## 2014-01-10 NOTE — Progress Notes (Signed)
Jeffrey Marquez Date of Birth  Oct 02, 1960       [facility] [address], [place], [place]  [address]   [place], [place]  [address] [address]   Fax  [phone]     Fax [phone]  Problem List: 1. malignant Hypertension 2. Diabetes millitus    History of Present Illness:  Jeffrey Marquez is a 53 yo who was are asked to see for evaluation of his marked hypertension. Jeffrey Marquez has had HTN for several years.  He did not get it treated for years. Has had a headache for several weeks - typically occurs in the daytime. Also has flashes in his visual field.    Was on HCTZ but this was stopped.   No real CP or dyspnea.  Slight DOE when he walks quickly.   Strong family hx of HTN No ETOH Non smoker  Avoids salt.   Uses Ms. 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 aspirin-acetaminophen-caffeine (EXCEDRIN MIGRAINE) 250-250-65 MG per tablet Take 2 tablets by mouth every 6 (six) hours as needed for headache.    [name] atorvastatin (LIPITOR) 10 MG tablet Take 1 tablet (10 mg total) by mouth daily. 90 tablet 3  . Bioflavonoid Products (BIOFLEX PO) Take 1 tablet by mouth daily.    . Blood Glucose Monitoring Suppl (TRUERESULT BLOOD GLUCOSE) W/DEVICE KIT Test blood sugar 1-2 times per day 1 each 0  . glucosamine-chondroitin 500-400 MG tablet Take 2 tablets by mouth every morning.    . hydrALAZINE (APRESOLINE) 10 MG tablet Take 1 tablet (10 mg total) by mouth 3 (three) times daily. 90 tablet 1  . hydrochlorothiazide (HYDRODIURIL) 25 MG tablet Take 1 tablet (25 mg total) by mouth daily. For blood pressure 30 tablet 3  . Ibuprofen-Diphenhydramine Cit (ADVIL PM PO) Take 2 tablets by mouth at bedtime as needed (sleep).    . losartan (COZAAR) 100 MG tablet Take 1 tablet (100 mg total) by mouth daily. 30 tablet 2    . metFORMIN (GLUCOPHAGE XR) 500 MG 24 hr tablet Take 1 tablet (500 mg total) by mouth daily with breakfast. 60 tablet 2  . Multiple Vitamin (MULTIVITAMIN WITH MINERALS) TABS tablet Take 1 tablet by mouth daily.    . Omega-3 Fatty Acids (FISH OIL PO) Take 1 capsule by mouth daily.    . ondansetron (ZOFRAN) 4 MG tablet Take 1 tablet (4 mg total) by mouth every 6 (six) hours. 12 tablet 0   No current facility-administered medications on file prior to visit.    No Known Allergies  Past Medical History  Diagnosis Date  . Diverticulitis   . Hypertension   . Diverticula of colon 2012  . Diabetes mellitus without complication   . Obesity     No past surgical history on file.  History  Smoking status  . Never Smoker   Smokeless tobacco  . Never Used    History  Alcohol Use No    Family History  Problem Relation Age of Onset  . Diabetes Mother   . Diabetes Brother     Reviw of Systems:  Reviewed in the HPI.  All other systems are negative.  Physical Exam: Blood pressure 180/130, pulse 92, height 5' 9.5" (1.765 m), weight 303 lb (137.44 kg), SpO2 94 %.  Wt Readings from Last 3 Encounters:  01/10/14 303 lb (137.44 kg)  01/06/14 303 lb (137.44 kg)  01/05/14 301 lb (136.533 kg)     General: Well developed, well nourished, in no acute distress.  Head: Normocephalic, atraumatic, sclera non-icteric, mucus membranes are moist,   Neck: Supple. Carotids are 2 + without bruits. No JVD   Lungs: Clear   Heart: Rr, normal S1S2  Abdomen: Soft, non-tender, non-distended with normal bowel sounds.  Msk:  Strength and tone are normal   Extremities: No clubbing or cyanosis. No edema.  Distal pedal pulses are 2+ and equal    Neuro: CN II - XII intact.  Alert and oriented X 3.   Psych:  Normal   ECG: NSR   Assessment / Plan:

## 2014-01-11 ENCOUNTER — Telehealth: Payer: Self-pay | Admitting: Cardiovascular Disease

## 2014-01-11 NOTE — Telephone Encounter (Signed)
New message     Update Pt saw Dr Elease HashimotoNahser yesterday..  BP reading last night was 171-108 at 9:37 pm, pulse 86; this am at 6:00 before medication 161/102 lying down, pulse 72, 169/112 sitting, pulse 74; again at 8am after medication 169/120 sitting, pulse 78.  Marlowe KaysGesila said Dr Elease HashimotoNahser wanted to be paged with the bp readings

## 2014-01-11 NOTE — Telephone Encounter (Signed)
Dr. Elease HashimotoNahser was given these BP readings by phone. States they are actually better than yesterday. Patient to continue same regimen. OV Thursday. Patient informed of this.

## 2014-01-12 ENCOUNTER — Ambulatory Visit (HOSPITAL_COMMUNITY): Payer: Worker's Compensation | Attending: Cardiovascular Disease | Admitting: Radiology

## 2014-01-12 DIAGNOSIS — I10 Essential (primary) hypertension: Secondary | ICD-10-CM | POA: Insufficient documentation

## 2014-01-12 NOTE — Progress Notes (Signed)
Echocardiogram performed.  

## 2014-01-13 ENCOUNTER — Encounter: Payer: Self-pay | Admitting: Cardiovascular Disease

## 2014-01-13 ENCOUNTER — Ambulatory Visit (INDEPENDENT_AMBULATORY_CARE_PROVIDER_SITE_OTHER): Payer: Worker's Compensation | Admitting: Cardiovascular Disease

## 2014-01-13 VITALS — BP 150/132 | HR 84 | Ht 69.5 in | Wt 299.4 lb

## 2014-01-13 DIAGNOSIS — I10 Essential (primary) hypertension: Secondary | ICD-10-CM

## 2014-01-13 MED ORDER — VALSARTAN-HYDROCHLOROTHIAZIDE 320-25 MG PO TABS: 1.0000 | ORAL_TABLET | Freq: Every day | ORAL | Status: DC

## 2014-01-13 MED ORDER — CARVEDILOL 12.5 MG PO TABS: 12.5000 mg | ORAL_TABLET | Freq: Two times a day (BID) | ORAL | Status: DC

## 2014-01-13 NOTE — Assessment & Plan Note (Signed)
Jeffrey HiddenGary is doing much better.  His BP is still elevated. We will see if his pharmacy carries Valsartan / HCTZ ( which we can use in place of losartan  And HCTZ Will increase his dose of coreg to 12. 5 BID.   we will DC the Losartan and the HCTZ And will start Valsartan / HCTZ 320 / 25 a day Will have him get a BMP  O/V in 4-6 weeks with BMP.

## 2014-01-13 NOTE — Patient Instructions (Signed)
Your physician has recommended you make the following change in your medication:  INCREASE Coreg to 12.5 mg twice daily STOP Cozaar  STOP HCTZ START Valsartan 320/25 mg once daily  Your physician recommends that you schedule a follow-up appointment in: 4-6 weeks with Dr. Elease HashimotoNahser  Your physician recommends that you return for lab work in: at next visit (BMET)

## 2014-01-13 NOTE — Progress Notes (Signed)
Jeffrey Marquez Date of Birth  1960-05-31       [facility] [address], [place], [place]  [address]   [place], [place]  [address] [place]   Fax  [phone]     Fax [phone]  Problem List: 1. malignant Hypertension 2. Diabetes millitus    History of Present Illness:  Jeffrey Marquez is a 53 yo who was are asked to see for evaluation of his marked hypertension. Jeffrey Marquez has had HTN for several years.  He did not get it treated for years. Has had a headache for several weeks - typically occurs in the daytime. Also has flashes in his visual field.    Was on HCTZ but this was stopped.   No real CP or dyspnea.  Slight DOE when he walks quickly.   Strong family hx of HTN No ETOH Non smoker  Avoids salt.   Uses Ms. 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 11, 2015:  Jeffrey Marquez is doing better.  He is followed for his malignant HTN. We started him on HCTZ 25 a day and Coreg 6.25 BID at his last visit.   Current Outpatient Prescriptions on File Prior to Visit  Medication Sig Dispense Refill  . aspirin-acetaminophen-caffeine (EXCEDRIN MIGRAINE) 250-250-65 MG per tablet Take 2 tablets by mouth every 6 (six) hours as needed for headache.    [name] atorvastatin (LIPITOR) 10 MG tablet Take 1 tablet (10 mg total) by mouth daily. 90 tablet 3  . Bioflavonoid Products (BIOFLEX PO) Take 1 tablet by mouth daily.    . Blood Glucose Monitoring Suppl (TRUERESULT BLOOD GLUCOSE) W/DEVICE KIT Test blood sugar 1-2 times per day 1 each 0  . carvedilol (COREG) 6.25 MG tablet Take 1 tablet (6.25 mg total) by mouth 2 (two) times daily. 180 tablet 3  . glucosamine-chondroitin 500-400 MG tablet Take 2 tablets by mouth every morning.    . hydrALAZINE (APRESOLINE) 10 MG tablet Take 1 tablet (10 mg total) by mouth 3 (three) times daily. 90 tablet 1  . hydrochlorothiazide (HYDRODIURIL) 25 MG tablet Take 1  tablet (25 mg total) by mouth daily. For blood pressure 30 tablet 3  . Ibuprofen-Diphenhydramine Cit (ADVIL PM PO) Take 2 tablets by mouth at bedtime as needed (sleep).    . losartan (COZAAR) 100 MG tablet Take 1 tablet (100 mg total) by mouth daily. 30 tablet 2  . metFORMIN (GLUCOPHAGE XR) 500 MG 24 hr tablet Take 1 tablet (500 mg total) by mouth daily with breakfast. 60 tablet 2  . Multiple Vitamin (MULTIVITAMIN WITH MINERALS) TABS tablet Take 1 tablet by mouth daily.    . Omega-3 Fatty Acids (FISH OIL PO) Take 1 capsule by mouth daily.    . potassium chloride 20 MEQ TBCR Take 20 mEq by mouth daily. 90 tablet 3   No current facility-administered medications on file prior to visit.    No Known Allergies  Past Medical History  Diagnosis Date  . Diverticulitis   . Hypertension   . Diverticula of colon 2012  . Diabetes mellitus without complication   . Obesity     No past surgical history on file.  History  Smoking status  . Never Smoker   Smokeless tobacco  . Never Used    History  Alcohol Use No    Family History  Problem Relation Age of Onset  .  Diabetes Mother   . Diabetes Brother     Reviw of Systems:  Reviewed in the HPI.  All other systems are negative.  Physical Exam: Blood pressure 150/132, pulse 84, height 5' 9.5" (1.765 m), weight 299 lb 6.4 oz (135.807 kg), SpO2 96 %. Wt Readings from Last 3 Encounters:  01/13/14 299 lb 6.4 oz (135.807 kg)  01/10/14 303 lb (137.44 kg)  01/06/14 303 lb (137.44 kg)     General: Well developed, well nourished, in no acute distress.  Head: Normocephalic, atraumatic, sclera non-icteric, mucus membranes are moist,   Neck: Supple. Carotids are 2 + without bruits. No JVD   Lungs: Clear   Heart: Rr, normal S1S2  Abdomen: Soft, non-tender, non-distended with normal bowel sounds.  Msk:  Strength and tone are normal   Extremities: No clubbing or cyanosis. No edema.  Distal pedal pulses are 2+ and equal    Neuro: CN  II - XII intact.  Alert and oriented X 3.   Psych:  Normal   ECG: NSR   Assessment / Plan:

## 2014-01-18 ENCOUNTER — Other Ambulatory Visit: Payer: Self-pay | Admitting: Internal Medicine

## 2014-01-20 ENCOUNTER — Other Ambulatory Visit: Payer: Self-pay

## 2014-01-23 ENCOUNTER — Other Ambulatory Visit (INDEPENDENT_AMBULATORY_CARE_PROVIDER_SITE_OTHER): Payer: Worker's Compensation | Admitting: *Deleted

## 2014-01-23 DIAGNOSIS — I10 Essential (primary) hypertension: Secondary | ICD-10-CM

## 2014-01-23 LAB — BASIC METABOLIC PANEL
BUN: 16 mg/dL (ref 6–23)
CHLORIDE: 98 meq/L (ref 96–112)
CO2: 34 mEq/L — ABNORMAL HIGH (ref 19–32)
Calcium: 9.2 mg/dL (ref 8.4–10.5)
Creatinine, Ser: 0.9 mg/dL (ref 0.4–1.5)
GFR: 115.62 mL/min (ref >60.00)
Glucose, Bld: 120 mg/dL — ABNORMAL HIGH (ref 70–99)
Potassium: 3.9 mEq/L (ref 3.5–5.1)
SODIUM: 140 meq/L (ref 135–145)

## 2014-02-06 ENCOUNTER — Ambulatory Visit: Payer: Self-pay | Attending: Internal Medicine

## 2014-02-10 ENCOUNTER — Ambulatory Visit (INDEPENDENT_AMBULATORY_CARE_PROVIDER_SITE_OTHER): Payer: Self-pay | Admitting: Cardiovascular Disease

## 2014-02-10 ENCOUNTER — Encounter: Payer: Self-pay | Admitting: Cardiovascular Disease

## 2014-02-10 VITALS — BP 114/100 | HR 74 | Ht 69.5 in | Wt 303.8 lb

## 2014-02-10 DIAGNOSIS — I10 Essential (primary) hypertension: Secondary | ICD-10-CM

## 2014-02-10 NOTE — Assessment & Plan Note (Signed)
Jeffrey Marquez is doing much better. His blood pressure readings aren't fairly well controlled. It appears that he has continued the HCTZ even after we started the valsartan/HCTZ. We'll have him discontinue the HCTZ tablet.  Ive encouraged him  at work on a better diet and exercise program. I encouraged him to lose  some weight. I'll see him again in 2 months for followup visit. We'll check a basic metabolic profile at that time.  We will add Aldactone if his BP  Goes higher after he stops the HCTZ.

## 2014-02-10 NOTE — Patient Instructions (Signed)
Your physician has recommended you make the following change in your medication:  STOP HCTZ (Hydrochlorothiazide) - you are getting this medication in your Valsartan   Your physician recommends that you schedule a follow-up appointment in: 2 months with Dr. Elease HashimotoNahser

## 2014-02-10 NOTE — Progress Notes (Signed)
Jeffrey Marquez Date of Birth  11-Apr-1960       [facility] [address], [address], [place], [place]  [address]   [place], [place]  [address] [place]   Fax  [phone]     Fax [phone]  Problem List: 1. malignant Hypertension 2. Diabetes millitus    History of Present Illness:  Jeffrey Marquez is a 54 yo who was are asked to see for evaluation of his marked hypertension. Jeffrey Marquez has had HTN for several years.  He did not get it treated for years. Has had a headache for several weeks - typically occurs in the daytime. Also has flashes in his visual field.    Was on HCTZ but this was stopped.   No real CP or dyspnea.  Slight DOE when he walks quickly.   Strong family hx of HTN No ETOH Non smoker  Avoids salt.   Uses Ms. 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 11, 2015:  Jeffrey Marquez is doing better.  He is followed for his malignant HTN. We started him on HCTZ 25 a day and Coreg 6.25 BID at his last visit.  Jan. 8, 2016:  Jeffrey Marquez is a 54 yo who is followed for malignant HTN.  He has done well on his meds and diet.  Slowly improving diet.  Able to exercise and [facility] maintaninence  At [facility].     Current Outpatient Prescriptions on File Prior to Visit  Medication Sig Dispense Refill  . aspirin-acetaminophen-caffeine (EXCEDRIN MIGRAINE) 250-250-65 MG per tablet Take 2 tablets by mouth every 6 (six) hours as needed for headache.    [name] atorvastatin (LIPITOR) 10 MG tablet Take 1 tablet (10 mg total) by mouth daily. 90 tablet 3  . Bioflavonoid Products (BIOFLEX PO) Take 1 tablet by mouth daily.    . Blood Glucose Monitoring Suppl (TRUERESULT BLOOD GLUCOSE) W/DEVICE KIT Test blood sugar 1-2 times per day 1 each 0  . carvedilol (COREG) 12.5 MG tablet Take 1 tablet (12.5 mg total) by mouth 2 (two) times daily. 180 tablet 3  . glucosamine-chondroitin 500-400 MG tablet Take 2 tablets by mouth  every morning.    . hydrALAZINE (APRESOLINE) 10 MG tablet Take 1 tablet (10 mg total) by mouth 3 (three) times daily. 90 tablet 1  . Ibuprofen-Diphenhydramine Cit (ADVIL PM PO) Take 2 tablets by mouth at bedtime as needed (sleep).    . metFORMIN (GLUCOPHAGE XR) 500 MG 24 hr tablet Take 1 tablet (500 mg total) by mouth daily with breakfast. 60 tablet 2  . Multiple Vitamin (MULTIVITAMIN WITH MINERALS) TABS tablet Take 1 tablet by mouth daily.    . Omega-3 Fatty Acids (FISH OIL PO) Take 1 capsule by mouth daily.    . potassium chloride 20 MEQ TBCR Take 20 mEq by mouth daily. 90 tablet 3  . valsartan-hydrochlorothiazide (DIOVAN-HCT) 320-25 MG per tablet Take 1 tablet by mouth daily. 31 tablet 11   No current facility-administered medications on file prior to visit.    No Known Allergies  Past Medical History  Diagnosis Date  . Diverticulitis   . Hypertension   . Diverticula of colon 2012  . Diabetes mellitus without complication   . Obesity     No past surgical history on file.  History  Smoking status  . Never Smoker   Smokeless tobacco  . Never Used  History  Alcohol Use No    Family History  Problem Relation Age of Onset  . Diabetes Mother   . Diabetes Brother     Reviw of Systems:  Reviewed in the HPI.  All other systems are negative.  Physical Exam: There were no vitals taken for this visit. Wt Readings from Last 3 Encounters:  01/13/14 299 lb 6.4 oz (135.807 kg)  01/10/14 303 lb (137.44 kg)  01/06/14 303 lb (137.44 kg)     General: Well developed, well nourished, in no acute distress.  Head: Normocephalic, atraumatic, sclera non-icteric, mucus membranes are moist,   Neck: Supple. Carotids are 2 + without bruits. No JVD   Lungs: Clear   Heart: Rr, normal S1S2  Abdomen: Soft, non-tender, non-distended with normal bowel sounds.  Msk:  Strength and tone are normal   Extremities: No clubbing or cyanosis. No edema.  Distal pedal pulses are 2+ and  equal    Neuro: CN II - XII intact.  Alert and oriented X 3.   Psych:  Normal   ECG:   Assessment / Plan:

## 2014-03-06 ENCOUNTER — Other Ambulatory Visit: Payer: Self-pay | Admitting: Internal Medicine

## 2014-03-07 ENCOUNTER — Telehealth: Payer: Self-pay | Admitting: Cardiovascular Disease

## 2014-03-07 NOTE — Telephone Encounter (Signed)
[facility]  They do not need prior auth completed. They do need refill prescription. This was ordered by Tyson DenseValerie Keck,NP in the [facility] office on 01/05/14.  They will contact Holland CommonsValerie Keck, NP with refill request.

## 2014-03-07 NOTE — Telephone Encounter (Signed)
New message      Pt needs prior authorization for his hyralazine 10mg .  Please [facility] to [facility].  You do not have to call [facility] back unless presc cannot be filled.  Pt will take last dosage today.

## 2014-03-10 ENCOUNTER — Other Ambulatory Visit: Payer: Self-pay | Admitting: *Deleted

## 2014-03-10 DIAGNOSIS — I10 Essential (primary) hypertension: Secondary | ICD-10-CM

## 2014-03-10 MED ORDER — HYDRALAZINE HCL 10 MG PO TABS
10.0000 mg | ORAL_TABLET | Freq: Three times a day (TID) | ORAL | Status: DC
Start: 2014-03-10 — End: 2014-08-09

## 2014-03-13 NOTE — Telephone Encounter (Signed)
What medication is this patient requesting?

## 2014-04-26 ENCOUNTER — Ambulatory Visit (INDEPENDENT_AMBULATORY_CARE_PROVIDER_SITE_OTHER): Payer: Self-pay | Admitting: Cardiovascular Disease

## 2014-04-26 ENCOUNTER — Encounter: Payer: Self-pay | Admitting: Cardiovascular Disease

## 2014-04-26 VITALS — BP 122/84 | HR 80 | Ht 69.5 in | Wt 311.0 lb

## 2014-04-26 DIAGNOSIS — I159 Secondary hypertension, unspecified: Secondary | ICD-10-CM

## 2014-04-26 LAB — BASIC METABOLIC PANEL
BUN: 15 mg/dL (ref 6–23)
CO2: 30 meq/L (ref 19–32)
Calcium: 9.5 mg/dL (ref 8.4–10.5)
Chloride: 101 mEq/L (ref 96–112)
Creatinine, Ser: 1.01 mg/dL (ref 0.40–1.50)
GFR: 99.82 mL/min (ref >60.00)
Glucose, Bld: 131 mg/dL — ABNORMAL HIGH (ref 70–99)
POTASSIUM: 3.6 meq/L (ref 3.5–5.1)
SODIUM: 139 meq/L (ref 135–145)

## 2014-04-26 MED ORDER — SILDENAFIL CITRATE 50 MG PO TABS: 50.0000 mg | ORAL_TABLET | ORAL | Status: DC | PRN

## 2014-04-26 NOTE — Progress Notes (Signed)
Cardiology Office Note   Date:  04/26/2014   ID:  Jeffrey Marquez, DOB [birth year], MRN [id number]  PCP:  Holland Commons, NP  Cardiologist:   Vesta Mixer, MD   Chief Complaint  Patient presents with  . Follow-up    essential hypertension   1. malignant Hypertension 2. Diabetes millitus  3. ED  History of Present Illness:  Rhythm is a 54 yo who was are asked to see for evaluation of his marked hypertension. Jeffrey Marquez has had HTN for several years. He did not get it treated for years. Has had a headache for several weeks - typically occurs in the daytime. Also has flashes in his visual field.  Was on HCTZ but this was stopped.   No real CP or dyspnea. Slight DOE when he walks quickly.   Strong family hx of HTN No ETOH Non smoker  Avoids salt.  Uses Ms. 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 11, 2015:  Jeffrey Marquez is doing better. He is followed for his malignant HTN. We started him on HCTZ 25 a day and Coreg 6.25 BID at his last visit.  Jan. 8, 2016:  Jeffrey Marquez is a 55 yo who is followed for malignant HTN.  He has done well on his meds and diet.  Slowly improving diet.  Able to exercise and does maintaninence At [facility].    April 26, 2014:   Jeffrey Marquez is a 54 y.o. male who presents for follow up of his HTN His BP control is great. He is having some ED issues.   No CP , no dyspnea.    Past Medical History  Diagnosis Date  . Diverticulitis   . Hypertension   . Diverticula of colon 2012  . Diabetes mellitus without complication   . Obesity     No past surgical history on file.   Current Outpatient Prescriptions  Medication Sig Dispense Refill  . aspirin-acetaminophen-caffeine (EXCEDRIN MIGRAINE) 250-250-65 MG per tablet Take 2 tablets by mouth every 6 (six) hours as needed for headache.    [name] atorvastatin (LIPITOR) 10 MG tablet Take 1 tablet (10 mg total) by mouth daily. 90 tablet 3  . Bioflavonoid Products (BIOFLEX PO) Take 1  tablet by mouth daily.    . Blood Glucose Monitoring Suppl (TRUERESULT BLOOD GLUCOSE) W/DEVICE KIT Test blood sugar 1-2 times per day 1 each 0  . carvedilol (COREG) 12.5 MG tablet Take 1 tablet (12.5 mg total) by mouth 2 (two) times daily. 180 tablet 3  . glucosamine-chondroitin 500-400 MG tablet Take 2 tablets by mouth every morning.    . hydrALAZINE (APRESOLINE) 10 MG tablet Take 1 tablet (10 mg total) by mouth 3 (three) times daily. 90 tablet 1  . Ibuprofen-Diphenhydramine Cit (ADVIL PM PO) Take 2 tablets by mouth at bedtime as needed (sleep).    . metFORMIN (GLUCOPHAGE XR) 500 MG 24 hr tablet Take 1 tablet (500 mg total) by mouth daily with breakfast. 60 tablet 2  . Multiple Vitamin (MULTIVITAMIN WITH MINERALS) TABS tablet Take 1 tablet by mouth daily.    . Omega-3 Fatty Acids (FISH OIL PO) Take 1 capsule by mouth daily.    . potassium chloride 20 MEQ TBCR Take 20 mEq by mouth daily. 90 tablet 3  . valsartan-hydrochlorothiazide (DIOVAN-HCT) 320-25 MG per tablet Take 1 tablet by mouth daily. 31 tablet 11   No current facility-administered medications for this visit.    Allergies:   Review of patient's allergies indicates no  known allergies.    Social History:  The patient  reports that he has never smoked. He has never used smokeless tobacco. He reports that he does not drink alcohol or use illicit drugs.   Family History:  The patient's family history includes Diabetes in his brother and mother.    ROS:  Please see the history of present illness.    Review of Systems: Constitutional:  denies fever, chills, diaphoresis, appetite change and fatigue.  HEENT: denies photophobia, eye pain, redness, hearing loss, ear pain, congestion, sore throat, rhinorrhea, sneezing, neck pain, neck stiffness and tinnitus.  Respiratory: denies SOB, DOE, cough, chest tightness, and wheezing.  Cardiovascular: denies chest pain, palpitations and leg swelling.  Gastrointestinal: denies nausea, vomiting,  abdominal pain, diarrhea, constipation, blood in stool.  Genitourinary: denies dysuria, urgency, frequency, hematuria, flank pain and difficulty urinating.  Musculoskeletal: denies  myalgias, back pain, joint swelling, arthralgias and gait problem.   Skin: denies pallor, rash and wound.  Neurological: denies dizziness, seizures, syncope, weakness, light-headedness, numbness and headaches.   Hematological: denies adenopathy, easy bruising, personal or family bleeding history.  Psychiatric/ Behavioral: denies suicidal ideation, mood changes, confusion, nervousness, sleep disturbance and agitation.       All other systems are reviewed and negative.    PHYSICAL EXAM: VS:  Ht 5' 9.5" (1.765 m)  Wt 311 lb (141.069 kg)  BMI 45.28 kg/m2 , BMI Body mass index is 45.28 kg/(m^2). GEN: Well nourished, well developed, in no acute distress HEENT: normal Neck: no JVD, carotid bruits, or masses Cardiac: RRR; no murmurs, rubs, or gallops,no edema  Respiratory:  clear to auscultation bilaterally, normal work of breathing GI: soft, nontender, nondistended, + BS MS: no deformity or atrophy Skin: warm and dry, no rash Neuro:  Strength and sensation are intact Psych: normal   EKG:  EKG is not ordered today.  Recent Labs: 11/18/2013: TSH 1.453 01/06/2014: ALT 18; Hemoglobin 13.6; Platelets 228 01/23/2014: BUN 16; Creatinine 0.9; Potassium 3.9; Sodium 140    Lipid Panel    Component Value Date/Time   CHOL 235* 11/18/2013 0929   TRIG 306* 11/18/2013 0929   HDL 35* 11/18/2013 0929   CHOLHDL 6.7 11/18/2013 0929   VLDL 61* 11/18/2013 0929   LDLCALC 139* 11/18/2013 0929      Wt Readings from Last 3 Encounters:  04/26/14 311 lb (141.069 kg)  02/10/14 303 lb 12.8 oz (137.803 kg)  01/13/14 299 lb 6.4 oz (135.807 kg)      Other studies Reviewed: Additional studies/ records that were reviewed today include: . Review of the above records demonstrates:    ASSESSMENT AND PLAN:  1. malignant  Hypertension- his blood pressure is much better since starting on current medications. He continues to work on a diet, exercise, and weight loss program.  2. Diabetes millitus - followed by his general medical doctor.  3. Erectile dysfunction:  Will try Viagra 50 PRN.  I advised him to get a referral to [facility].    Will see him in 6 months.     Current medicines are reviewed at length with the patient today.  The patient does not have concerns regarding medicines.  The following changes have been made:  no change  Labs/ tests ordered today include:  No orders of the defined types were placed in this encounter.     Disposition:   FU with me in 6 months     Signed, Barbee Mamula, Wonda Cheng, MD  04/26/2014 8:10 AM    [facility] [place], [place], [place]  [address] Phone: [phone]; Fax: [phone]

## 2014-04-26 NOTE — Patient Instructions (Signed)
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 Elease HashimotoNahser. You will receive a reminder letter in the mail two months in advance. If you don't receive a letter, please call our office to schedule the follow-up appointment.

## 2014-05-18 ENCOUNTER — Ambulatory Visit: Payer: Self-pay | Attending: Internal Medicine | Admitting: Internal Medicine

## 2014-05-18 ENCOUNTER — Encounter: Payer: Self-pay | Admitting: Internal Medicine

## 2014-05-18 VITALS — BP 151/92 | HR 71 | Temp 98.2°F | Resp 16 | Ht 69.5 in | Wt 318.0 lb

## 2014-05-18 DIAGNOSIS — E119 Type 2 diabetes mellitus without complications: Secondary | ICD-10-CM | POA: Insufficient documentation

## 2014-05-18 DIAGNOSIS — I10 Essential (primary) hypertension: Secondary | ICD-10-CM | POA: Insufficient documentation

## 2014-05-18 DIAGNOSIS — E785 Hyperlipidemia, unspecified: Secondary | ICD-10-CM | POA: Insufficient documentation

## 2014-05-18 DIAGNOSIS — H538 Other visual disturbances: Secondary | ICD-10-CM | POA: Insufficient documentation

## 2014-05-18 LAB — GLUCOSE, POCT (MANUAL RESULT ENTRY): POC Glucose: 116 mg/dl — AB (ref 70–99)

## 2014-05-18 LAB — POCT GLYCOSYLATED HEMOGLOBIN (HGB A1C): Hemoglobin A1C: 6.4

## 2014-05-18 MED ORDER — ATORVASTATIN CALCIUM 10 MG PO TABS: 10.0000 mg | ORAL_TABLET | Freq: Every day | ORAL | Status: DC

## 2014-05-18 MED ORDER — METFORMIN HCL ER 500 MG PO TB24
500.0000 mg | ORAL_TABLET | Freq: Every day | ORAL | Status: DC
Start: 2014-05-18 — End: 2015-02-12

## 2014-05-18 NOTE — Progress Notes (Signed)
Patient ID: Jeffrey Marquez, male   DOB: 1961/01/31, 54 y.o.   MRN: [id number] 1. HTN: Medication: coreg, hydralazine, diovan hctz. He reports that he ran out of his medications yesterday. He has abeen previously controlled per cardiology notes Home BP monitoring: his fiance takes his pressures and states that they have been normal Negative ROS: headaches, chest pain, palpitations, edema   2. DM2:  Medication: Metformin XR 500 mg once daily Home CBG monitoring: usually high 90's  Hypoglycemic event: none Positive ROS blurred vision  Negative YQM:VHQIONGEXBROS:neuropathy, polyuria, polydipsia  3. HLD: Medication: lipitor Tolerance: well  Negative MWU:XLKGMWNROS:myalgia, claudication   Social History reviewed: Smoker no Exercise plans to start soon.  Physical Exam  Constitutional: He is oriented to person, place, and time.  Cardiovascular: Normal rate, regular rhythm and normal heart sounds.   Pulmonary/Chest: Effort normal and breath sounds normal.  Abdominal: Bowel sounds are normal.  Musculoskeletal: He exhibits no edema.  Neurological: He is alert and oriented to person, place, and time.  Skin: Skin is warm and dry.    Jeffrey Marquez was seen today for follow-up.  Diagnoses and all orders for this visit:  Type 2 diabetes mellitus without complication Orders: -     Glucose (CBG) -     HgB A1c -     Refilled metFORMIN (GLUCOPHAGE XR) 500 MG 24 hr tablet; Take 1 tablet (500 mg total) by mouth daily with breakfast. Patients diabetes is well control as evidence by consistently low a1c.  Patient will continue with current therapy and continue to make necessary lifestyle changes.  Reviewed foot care, diet, exercise, annual health maintenance with patient.   Essential hypertension Patient blood pressure is stable and may continue on current medication.  Education on diet, exercise, and modifiable risk factors discussed. Will obtain appropriate labs as needed. Will follow up in 3-6 months.   HLD  (hyperlipidemia) Orders: -     Lipid panel; Future -     atorvastatin (LIPITOR) 10 MG tablet; Take 1 tablet (10 mg total) by mouth daily Education provided on proper lifestyle changes in order to lower cholesterol. Patient advised to maintain healthy weight and to keep total fat intake at 25-35% of total calories and carbohydrates 50-60% of total daily calories. Explained how high cholesterol places patient at risk for heart disease. Patient placed on appropriate medication and repeat labs in 6 months   Return in about 3 months (around 08/17/2014) for DM/HTN AND 1 WK LAB VISIT.  Holland CommonsKECK, Imberly Troxler, NP 05/23/2014  8:31 PM

## 2014-05-18 NOTE — Progress Notes (Signed)
Pt is here following up on his diabetes and HTN. 

## 2014-05-18 NOTE — Patient Instructions (Signed)
Exercise to Lose Weight Exercise and a healthy diet may help you lose weight. Your doctor may suggest specific exercises. EXERCISE IDEAS AND TIPS  Choose low-cost things you enjoy doing, such as walking, bicycling, or exercising to workout videos.  Take stairs instead of the elevator.  Walk during your lunch break.  Park your car further away from work or school.  Go to a gym or an exercise class.  Start with 5 to 10 minutes of exercise each day. Build up to 30 minutes of exercise 4 to 6 days a week.  Wear shoes with good support and comfortable clothes.  Stretch before and after working out.  Work out until you breathe harder and your heart beats faster.  Drink extra water when you exercise.  Do not do so much that you hurt yourself, feel dizzy, or get very short of breath. Exercises that burn about 150 calories:  Running 1  miles in 15 minutes.  Playing volleyball for 45 to 60 minutes.  Washing and waxing a car for 45 to 60 minutes.  Playing touch football for 45 minutes.  Walking 1  miles in 35 minutes.  Pushing a stroller 1  miles in 30 minutes.  Playing basketball for 30 minutes.  Raking leaves for 30 minutes.  Bicycling 5 miles in 30 minutes.  Walking 2 miles in 30 minutes.  Dancing for 30 minutes.  Shoveling snow for 15 minutes.  Swimming laps for 20 minutes.  Walking up stairs for 15 minutes.  Bicycling 4 miles in 15 minutes.  Gardening for 30 to 45 minutes.  Jumping rope for 15 minutes.  Washing windows or floors for 45 to 60 minutes. Document Released: 02/22/2010 Document Revised: 04/14/2011 Document Reviewed: 02/22/2010 Exit[employer] Patient Information 2015 [employer]. This information is not intended to replace advice given to you by your health care provider. Make sure you discuss any questions you have with your health care provider.  

## 2014-05-22 ENCOUNTER — Other Ambulatory Visit: Payer: Self-pay | Admitting: Internal Medicine

## 2014-05-23 ENCOUNTER — Other Ambulatory Visit: Payer: Self-pay | Admitting: Nurse Practitioner

## 2014-05-23 MED ORDER — SILDENAFIL CITRATE 50 MG PO TABS: 50.0000 mg | ORAL_TABLET | ORAL | Status: DC | PRN

## 2014-08-09 ENCOUNTER — Other Ambulatory Visit: Payer: Self-pay | Admitting: Internal Medicine

## 2014-08-16 ENCOUNTER — Telehealth: Payer: Self-pay | Admitting: Internal Medicine

## 2014-08-16 NOTE — Telephone Encounter (Signed)
Nurse called patient, patient verified date of birth. Patient requesting refill for hydralazine. Upon investigating chart refill request has been sent today. Patient aware of prescription sent to pharmacy. Patient voices understanding and has no further questions at this time.

## 2014-08-16 NOTE — Telephone Encounter (Signed)
Prior authorization is continuing to be asked  For Hydralazine...the patient is now 2 weeks without medication, fiance would like a call back today...[name].[name]. [phone] Leanna SatoGesilia.

## 2014-10-18 ENCOUNTER — Ambulatory Visit (HOSPITAL_COMMUNITY)
Admission: AD | Admit: 2014-10-18 | Discharge: 2014-10-18 | Disposition: A | Discharge status: Home / Self Care | Payer: Self-pay | Source: Ambulatory Visit | Attending: Cardiovascular Disease | Admitting: Cardiovascular Disease

## 2014-10-18 ENCOUNTER — Encounter: Payer: Self-pay | Admitting: Cardiovascular Disease

## 2014-10-18 ENCOUNTER — Encounter (HOSPITAL_COMMUNITY)
Admission: AD | Disposition: A | Discharge status: Home / Self Care | Payer: Self-pay | Source: Ambulatory Visit | Attending: Cardiovascular Disease

## 2014-10-18 ENCOUNTER — Encounter: Payer: Self-pay | Admitting: Nurse Practitioner

## 2014-10-18 ENCOUNTER — Ambulatory Visit (INDEPENDENT_AMBULATORY_CARE_PROVIDER_SITE_OTHER): Payer: Self-pay | Admitting: Cardiovascular Disease

## 2014-10-18 VITALS — BP 190/130 | HR 74 | Ht 69.5 in | Wt 324.4 lb

## 2014-10-18 DIAGNOSIS — I200 Unstable angina: Secondary | ICD-10-CM

## 2014-10-18 DIAGNOSIS — I2510 Atherosclerotic heart disease of native coronary artery without angina pectoris: Secondary | ICD-10-CM | POA: Insufficient documentation

## 2014-10-18 DIAGNOSIS — N529 Male erectile dysfunction, unspecified: Secondary | ICD-10-CM | POA: Insufficient documentation

## 2014-10-18 DIAGNOSIS — I25110 Atherosclerotic heart disease of native coronary artery with unstable angina pectoris: Secondary | ICD-10-CM

## 2014-10-18 DIAGNOSIS — Z7982 Long term (current) use of aspirin: Secondary | ICD-10-CM | POA: Insufficient documentation

## 2014-10-18 DIAGNOSIS — Z6841 Body Mass Index (BMI) 40.0 and over, adult: Secondary | ICD-10-CM | POA: Insufficient documentation

## 2014-10-18 DIAGNOSIS — Z8249 Family history of ischemic heart disease and other diseases of the circulatory system: Secondary | ICD-10-CM | POA: Insufficient documentation

## 2014-10-18 DIAGNOSIS — E119 Type 2 diabetes mellitus without complications: Secondary | ICD-10-CM | POA: Insufficient documentation

## 2014-10-18 DIAGNOSIS — I10 Essential (primary) hypertension: Secondary | ICD-10-CM | POA: Insufficient documentation

## 2014-10-18 DIAGNOSIS — E669 Obesity, unspecified: Secondary | ICD-10-CM | POA: Insufficient documentation

## 2014-10-18 DIAGNOSIS — R0789 Other chest pain: Secondary | ICD-10-CM | POA: Insufficient documentation

## 2014-10-18 DIAGNOSIS — Z79899 Other long term (current) drug therapy: Secondary | ICD-10-CM | POA: Insufficient documentation

## 2014-10-18 DIAGNOSIS — R0609 Other forms of dyspnea: Secondary | ICD-10-CM | POA: Insufficient documentation

## 2014-10-18 DIAGNOSIS — Z791 Long term (current) use of non-steroidal anti-inflammatories (NSAID): Secondary | ICD-10-CM | POA: Insufficient documentation

## 2014-10-18 HISTORY — PX: CARDIAC CATHETERIZATION: SHX172

## 2014-10-18 LAB — GLUCOSE, CAPILLARY
GLUCOSE-CAPILLARY: 98 mg/dL (ref 65–99)
Glucose-Capillary: 103 mg/dL — ABNORMAL HIGH (ref 65–99)

## 2014-10-18 LAB — CBC WITH DIFFERENTIAL/PLATELET
BASOS ABS: 0 10*3/uL (ref 0.0–0.1)
Basophils Relative: 0.1 % (ref 0.0–3.0)
Eosinophils Absolute: 0.1 10*3/uL (ref 0.0–0.7)
Eosinophils Relative: 1.5 % (ref 0.0–5.0)
HCT: 43.8 % (ref 39.0–52.0)
Hemoglobin: 14.8 g/dL (ref 13.0–17.0)
LYMPHS ABS: 2 10*3/uL (ref 0.7–4.0)
Lymphocytes Relative: 20.7 % (ref 12.0–46.0)
MCHC: 33.8 g/dL (ref 30.0–36.0)
MCV: 87.5 fl (ref 78.0–100.0)
MONO ABS: 0.8 10*3/uL (ref 0.1–1.0)
Monocytes Relative: 8.5 % (ref 3.0–12.0)
NEUTROS PCT: 69.2 % (ref 43.0–77.0)
Neutro Abs: 6.6 10*3/uL (ref 1.4–7.7)
Platelets: 223 10*3/uL (ref 150.0–400.0)
RBC: 5.01 Mil/uL (ref 4.22–5.81)
RDW: 13.4 % (ref 11.5–15.5)
WBC: 9.6 10*3/uL (ref 4.0–10.5)

## 2014-10-18 LAB — PROTIME-INR
INR: 1 ratio (ref 0.8–1.0)
Prothrombin Time: 11.2 s (ref 9.6–13.1)

## 2014-10-18 LAB — BASIC METABOLIC PANEL
BUN: 13 mg/dL (ref 6–23)
CALCIUM: 9.8 mg/dL (ref 8.4–10.5)
CHLORIDE: 99 meq/L (ref 96–112)
CO2: 33 meq/L — AB (ref 19–32)
CREATININE: 1.06 mg/dL (ref 0.40–1.50)
GFR: 94.23 mL/min (ref >60.00)
Glucose, Bld: 169 mg/dL — ABNORMAL HIGH (ref 70–99)
Potassium: 3.8 mEq/L (ref 3.5–5.1)
Sodium: 142 mEq/L (ref 135–145)

## 2014-10-18 SURGERY — LEFT HEART CATH AND CORONARY ANGIOGRAPHY
Anesthesia: LOCAL

## 2014-10-18 MED ORDER — SODIUM CHLORIDE 0.9 % IV SOLN: 250.0000 mL | INTRAVENOUS | Status: DC | PRN

## 2014-10-18 MED ORDER — NITROGLYCERIN 1 MG/10 ML FOR IR/CATH LAB
INTRA_ARTERIAL | Status: AC
  Filled 2014-10-18: qty 10

## 2014-10-18 MED ORDER — ASPIRIN 81 MG PO CHEW: 81.0000 mg | CHEWABLE_TABLET | ORAL | Status: DC

## 2014-10-18 MED ORDER — ACETAMINOPHEN 325 MG PO TABS: 650.0000 mg | ORAL_TABLET | ORAL | Status: DC | PRN

## 2014-10-18 MED ORDER — LIDOCAINE HCL (PF) 1 % IJ SOLN
INTRAMUSCULAR | Status: AC
  Filled 2014-10-18: qty 30

## 2014-10-18 MED ORDER — HEPARIN SODIUM (PORCINE) 1000 UNIT/ML IJ SOLN
INTRAMUSCULAR | Status: AC
  Filled 2014-10-18: qty 1

## 2014-10-18 MED ORDER — HYDRALAZINE HCL 20 MG/ML IJ SOLN
INTRAMUSCULAR | Status: AC
Start: 2014-10-18 — End: 2014-10-18
  Administered 2014-10-18: 10 mg via INTRAVENOUS
  Filled 2014-10-18: qty 1

## 2014-10-18 MED ORDER — CARVEDILOL 12.5 MG PO TABS: 12.5000 mg | ORAL_TABLET | Freq: Two times a day (BID) | ORAL | Status: DC

## 2014-10-18 MED ORDER — FENTANYL CITRATE (PF) 100 MCG/2ML IJ SOLN
INTRAMUSCULAR | Status: AC
  Filled 2014-10-18: qty 4

## 2014-10-18 MED ORDER — HEPARIN (PORCINE) IN NACL 2-0.9 UNIT/ML-% IJ SOLN
INTRAMUSCULAR | Status: AC
  Filled 2014-10-18: qty 1000

## 2014-10-18 MED ORDER — HEPARIN SODIUM (PORCINE) 1000 UNIT/ML IJ SOLN
INTRAMUSCULAR | Status: DC | PRN
  Administered 2014-10-18: 6000 [IU] via INTRAVENOUS

## 2014-10-18 MED ORDER — LABETALOL HCL 5 MG/ML IV SOLN
INTRAVENOUS | Status: DC | PRN
  Administered 2014-10-18 (×2): 20 mg via INTRAVENOUS

## 2014-10-18 MED ORDER — LIDOCAINE HCL (PF) 1 % IJ SOLN
INTRAMUSCULAR | Status: DC | PRN
  Administered 2014-10-18: 14:00:00

## 2014-10-18 MED ORDER — IOHEXOL 350 MG/ML SOLN
INTRAVENOUS | Status: DC | PRN
  Administered 2014-10-18: 90 mL via INTRA_ARTERIAL

## 2014-10-18 MED ORDER — MIDAZOLAM HCL 2 MG/2ML IJ SOLN
INTRAMUSCULAR | Status: DC | PRN
  Administered 2014-10-18: 2 mg via INTRAVENOUS

## 2014-10-18 MED ORDER — NITROGLYCERIN 0.4 MG SL SUBL: 0.4000 mg | SUBLINGUAL_TABLET | SUBLINGUAL | Status: DC | PRN

## 2014-10-18 MED ORDER — HYDRALAZINE HCL 20 MG/ML IJ SOLN
10.0000 mg | Freq: Once | INTRAMUSCULAR | Status: AC
  Administered 2014-10-18: 10 mg via INTRAVENOUS

## 2014-10-18 MED ORDER — SODIUM CHLORIDE 0.9 % IJ SOLN: 3.0000 mL | INTRAMUSCULAR | Status: DC | PRN

## 2014-10-18 MED ORDER — SODIUM CHLORIDE 0.9 % WEIGHT BASED INFUSION
3.0000 mL/kg/h | INTRAVENOUS | Status: AC
  Administered 2014-10-18: 3 mL/kg/h via INTRAVENOUS

## 2014-10-18 MED ORDER — LABETALOL HCL 5 MG/ML IV SOLN
INTRAVENOUS | Status: AC
  Filled 2014-10-18: qty 4

## 2014-10-18 MED ORDER — SODIUM CHLORIDE 0.9 % IV SOLN: INTRAVENOUS | Status: DC

## 2014-10-18 MED ORDER — SODIUM CHLORIDE 0.9 % IJ SOLN: 3.0000 mL | Freq: Two times a day (BID) | INTRAMUSCULAR | Status: DC

## 2014-10-18 MED ORDER — HYDRALAZINE HCL 25 MG PO TABS: 25.0000 mg | ORAL_TABLET | Freq: Three times a day (TID) | ORAL | Status: DC

## 2014-10-18 MED ORDER — FENTANYL CITRATE (PF) 100 MCG/2ML IJ SOLN
INTRAMUSCULAR | Status: DC | PRN
  Administered 2014-10-18: 50 ug via INTRAVENOUS

## 2014-10-18 MED ORDER — VERAPAMIL HCL 2.5 MG/ML IV SOLN
INTRAVENOUS | Status: AC
  Filled 2014-10-18: qty 2

## 2014-10-18 MED ORDER — SODIUM CHLORIDE 0.9 % WEIGHT BASED INFUSION
1.0000 mL/kg/h | INTRAVENOUS | Status: DC
Start: 2014-10-18 — End: 2014-10-18

## 2014-10-18 MED ORDER — HEPARIN (PORCINE) IN NACL 2-0.9 UNIT/ML-% IJ SOLN
INTRAMUSCULAR | Status: DC | PRN
  Administered 2014-10-18: 14:00:00 via INTRA_ARTERIAL

## 2014-10-18 MED ORDER — HEPARIN (PORCINE) IN NACL 2-0.9 UNIT/ML-% IJ SOLN
INTRAMUSCULAR | Status: AC
  Filled 2014-10-18: qty 500

## 2014-10-18 MED ORDER — MIDAZOLAM HCL 2 MG/2ML IJ SOLN
INTRAMUSCULAR | Status: AC
  Filled 2014-10-18: qty 4

## 2014-10-18 MED ORDER — ONDANSETRON HCL 4 MG/2ML IJ SOLN: 4.0000 mg | Freq: Four times a day (QID) | INTRAMUSCULAR | Status: DC | PRN

## 2014-10-18 SURGICAL SUPPLY — 12 items
CATH INFINITI 5 FR JL3.5 (CATHETERS) ×2 IMPLANT
CATH INFINITI 5FR ANG PIGTAIL (CATHETERS) ×2 IMPLANT
CATH INFINITI JR4 5F (CATHETERS) ×2 IMPLANT
DEVICE RAD COMP TR BAND LRG (VASCULAR PRODUCTS) ×2 IMPLANT
GLIDESHEATH SLEND SS 6F .021 (SHEATH) ×2 IMPLANT
KIT HEART LEFT (KITS) ×2 IMPLANT
PACK CARDIAC CATHETERIZATION (CUSTOM PROCEDURE TRAY) ×2 IMPLANT
SYR MEDRAD MARK V 150ML (SYRINGE) ×2 IMPLANT
TRANSDUCER W/STOPCOCK (MISCELLANEOUS) ×2 IMPLANT
TUBING CIL FLEX 10 FLL-RA (TUBING) ×2 IMPLANT
WIRE HI TORQ VERSACORE-J 145CM (WIRE) ×1 IMPLANT
WIRE SAFE-T 1.5MM-J .035X260CM (WIRE) ×2 IMPLANT

## 2014-10-18 NOTE — Patient Instructions (Addendum)
Medication Instructions:  INCREASE Carvedilol (Coreg) to 12.5 mg twice daily INCREASE Hydralazine to 25 mg three times per day    Labwork: TODAY - pre-procedure labs:  BMET, CBC, Pt/INR   Testing/Procedures: Your physician has requested that you have a cardiac catheterization. Cardiac catheterization is used to diagnose and/or treat various heart conditions. Doctors may recommend this procedure for a number of different reasons. The most common reason is to evaluate chest pain. Chest pain can be a symptom of coronary artery disease (CAD), and cardiac catheterization can show whether plaque is narrowing or blocking your heart's arteries. This procedure is also used to evaluate the valves, as well as measure the blood flow and oxygen levels in different parts of your heart. For further information please visit [email or web address] Please follow instruction sheet, as given.   Follow-Up: Your physician recommends that you return for follow-up appointment in: 6 weeks with Dr. Elease Hashimoto - Friday Oct. 28 at 7:45

## 2014-10-18 NOTE — Progress Notes (Signed)
Elevated BP continues, Dr Excell Seltzer was paged, orders followed.

## 2014-10-18 NOTE — Discharge Instructions (Signed)
Radial Site Care °Refer to this sheet in the next few weeks. These instructions provide you with information on caring for yourself after your procedure. Your caregiver may also give you more specific instructions. Your treatment has been planned according to current medical practices, but problems sometimes occur. Call your caregiver if you have any problems or questions after your procedure. °HOME CARE INSTRUCTIONS °· You may shower the day after the procedure. Remove the bandage (dressing) and gently wash the site with plain soap and water. Gently pat the site dry. °· Do not apply powder or lotion to the site. °· Do not submerge the affected site in water for 3 to 5 days. °· Inspect the site at least twice daily. °· Do not flex or bend the affected arm for 24 hours. °· No lifting over 5 pounds (2.3 kg) for 5 days after your procedure. °· Do not drive home if you are discharged the same day of the procedure. Have someone else drive you. °· You may drive 24 hours after the procedure unless otherwise instructed by your caregiver. °· Do not operate machinery or power tools for 24 hours. °· A responsible adult should be with you for the first 24 hours after you arrive home. °What to expect: °· Any bruising will usually fade within 1 to 2 weeks. °· Blood that collects in the tissue (hematoma) may be painful to the touch. It should usually decrease in size and tenderness within 1 to 2 weeks. °SEEK IMMEDIATE MEDICAL CARE IF: °· You have unusual pain at the radial site. °· You have redness, warmth, swelling, or pain at the radial site. °· You have drainage (other than a small amount of blood on the dressing). °· You have chills. °· You have a fever or persistent symptoms for more than 72 hours. °· You have a fever and your symptoms suddenly get worse. °· Your arm becomes pale, cool, tingly, or numb. °· You have heavy bleeding from the site. Hold pressure on the site. CALL 911 °Document Released: 02/22/2010 Document  Revised: 04/14/2011 Document Reviewed: 02/22/2010 °ExitCare® Patient Information ©2015 [employer]. This information is not intended to replace advice given to you by your health care provider. Make sure you discuss any questions you have with your health care provider. ° °

## 2014-10-18 NOTE — Progress Notes (Signed)
Cardiology Office Note   Date:  10/18/2014   ID:  Jeffrey Marquez, DOB Jul 31, 1960, MRN [id number]  PCP:  Lance Bosch, NP  Cardiologist:   Thayer Headings, MD   Chief Complaint  Patient presents with  . Follow-up    HTN, CP    1. malignant Hypertension 2. Diabetes millitus  3. ED  History of Present Illness:  Jeffrey Marquez is a 54 yo who was are asked to see for evaluation of his marked hypertension. Chares has had HTN for several years. He did not get it treated for years. Has had a headache for several weeks - typically occurs in the daytime. Also has flashes in his visual field.  Was on HCTZ but this was stopped.   No real CP or dyspnea. Slight DOE when he walks quickly.   Strong family hx of HTN No ETOH Non smoker  Avoids salt.  Uses Ms. 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 11, 2015:  Jeffrey Marquez is doing better. He is followed for his malignant HTN. We started him on HCTZ 25 a day and Coreg 6.25 BID at his last visit.  Jan. 8, 2016:  Jeffrey Marquez is a 54 yo who is followed for malignant HTN.  He has done well on his meds and diet.  Slowly improving diet.  Able to exercise and does maintaninence At [facility].    April 26, 2014:   Jeffrey Marquez is a 54 y.o. male who presents for follow up of his HTN His BP control is great. He is having some ED issues.   No CP , no dyspnea.  Sept. 14, 2016: Jeffrey Marquez has had some DOE and chest tightness with walking or mowing the lawn .  Woke up this am with some chest tightness.     Past Medical History  Diagnosis Date  . Diverticulitis   . Hypertension   . Diverticula of colon 2012  . Diabetes mellitus without complication   . Obesity     No past surgical history on file.   Current Outpatient Prescriptions  Medication Sig Dispense Refill  . aspirin-acetaminophen-caffeine (EXCEDRIN MIGRAINE) 250-250-65 MG per tablet Take 2 tablets by mouth every 6 (six) hours as needed for headache.    [name] atorvastatin  (LIPITOR) 10 MG tablet Take 1 tablet (10 mg total) by mouth daily. 30 tablet 5  . Bioflavonoid Products (BIOFLEX PO) Take 1 tablet by mouth daily.    . Blood Glucose Monitoring Suppl (TRUERESULT BLOOD GLUCOSE) W/DEVICE KIT Test blood sugar 1-2 times per day 1 each 0  . carvedilol (COREG) 6.25 MG tablet Take 6.25 mg by mouth 2 (two) times daily with a meal.    . glucosamine-chondroitin 500-400 MG tablet Take 2 tablets by mouth every morning.    . hydrALAZINE (APRESOLINE) 10 MG tablet TAKE 1 TABLET BY MOUTH 3 TIMES DAILY 90 tablet 1  . Ibuprofen-Diphenhydramine Cit (ADVIL PM PO) Take 2 tablets by mouth at bedtime as needed (sleep).    . metFORMIN (GLUCOPHAGE XR) 500 MG 24 hr tablet Take 1 tablet (500 mg total) by mouth daily with breakfast. 60 tablet 4  . Multiple Vitamin (MULTIVITAMIN WITH MINERALS) TABS tablet Take 1 tablet by mouth daily.    . Omega-3 Fatty Acids (FISH OIL PO) Take 1 capsule by mouth daily.    . potassium chloride 20 MEQ TBCR Take 20 mEq by mouth daily. 90 tablet 3  . valsartan-hydrochlorothiazide (DIOVAN-HCT) 320-25 MG per tablet Take 1 tablet by  mouth daily. 31 tablet 11  . sildenafil (VIAGRA) 50 MG tablet Take 1 tablet (50 mg total) by mouth as needed for erectile dysfunction. (Patient not taking: Reported on 10/18/2014) 15 tablet 3   No current facility-administered medications for this visit.    Allergies:   Review of patient's allergies indicates no known allergies.    Social History:  The patient  reports that he has never smoked. He has never used smokeless tobacco. He reports that he does not drink alcohol or use illicit drugs.   Family History:  The patient's family history includes Diabetes in his brother and mother.    ROS:  Please see the history of present illness.    Review of Systems: Constitutional:  denies fever, chills, diaphoresis, appetite change and fatigue.  HEENT: denies photophobia, eye pain, redness, hearing loss, ear pain, congestion, sore  throat, rhinorrhea, sneezing, neck pain, neck stiffness and tinnitus.  Respiratory: denies SOB, DOE, cough, chest tightness, and wheezing.  Cardiovascular: denies chest pain, palpitations and leg swelling.  Gastrointestinal: denies nausea, vomiting, abdominal pain, diarrhea, constipation, blood in stool.  Genitourinary: denies dysuria, urgency, frequency, hematuria, flank pain and difficulty urinating.  Musculoskeletal: denies  myalgias, back pain, joint swelling, arthralgias and gait problem.   Skin: denies pallor, rash and wound.  Neurological: denies dizziness, seizures, syncope, weakness, light-headedness, numbness and headaches.   Hematological: denies adenopathy, easy bruising, personal or family bleeding history.  Psychiatric/ Behavioral: denies suicidal ideation, mood changes, confusion, nervousness, sleep disturbance and agitation.       All other systems are reviewed and negative.    PHYSICAL EXAM: VS:  BP 190/130 mmHg  Pulse 74  Ht 5' 9.5" (1.765 m)  Wt 147.147 kg (324 lb 6.4 oz)  BMI 47.23 kg/m2 , BMI Body mass index is 47.23 kg/(m^2). GEN: Well nourished, well developed, in no acute distress HEENT: normal Neck: no JVD, carotid bruits, or masses Cardiac: RRR; no murmurs, rubs, or gallops,no edema  Respiratory:  clear to auscultation bilaterally, normal work of breathing GI: soft, nontender, nondistended, + BS MS: no deformity or atrophy Skin: warm and dry, no rash Neuro:  Strength and sensation are intact Psych: normal   EKG:  EKG is ordered today. Normal sinus rhythm at 74. He has T-wave inversions in the anterolateral leads which is new from previous EKGs.  Recent Labs: 11/18/2013: TSH 1.453 01/06/2014: ALT 18; Hemoglobin 13.6; Platelets 228 04/26/2014: BUN 15; Creatinine, Ser 1.01; Potassium 3.6; Sodium 139    Lipid Panel    Component Value Date/Time   CHOL 235* 11/18/2013 0929   TRIG 306* 11/18/2013 0929   HDL 35* 11/18/2013 0929   CHOLHDL 6.7  11/18/2013 0929   VLDL 61* 11/18/2013 0929   LDLCALC 139* 11/18/2013 0929      Wt Readings from Last 3 Encounters:  10/18/14 147.147 kg (324 lb 6.4 oz)  05/18/14 144.244 kg (318 lb)  04/26/14 141.069 kg (311 lb)      Other studies Reviewed: Additional studies/ records that were reviewed today include: . Review of the above records demonstrates:    ASSESSMENT AND PLAN:  1. malignant Hypertension- his blood pressure had previously been well-controlled. It's very elevated today. We'll increase the carvedilol to 12.5 mg twice a day. We will increase the hydralazine to 25 mg 3 times a day.  2. Diabetes millitus - followed by his general medical doctor.  3. Chest tightness: Patient presents today with exertional chest tightness. He's had progressive chest tightness with exertion. We will schedule  him for cardiac catheterization today. We'll check precath labs. Will start ASA,  Script for NTG .    4. Erectile dysfunction:    He has a script for viagra but has not taken any     Will see him in 6 weeks    Current medicines are reviewed at length with the patient today.  The patient does not have concerns regarding medicines.  The following changes have been made:  no change  Labs/ tests ordered today include:  No orders of the defined types were placed in this encounter.     Disposition:   FU with me in 6 months     Signed, Hollee Fate, Wonda Cheng, MD  10/18/2014 8:13 AM    [facility] [place], [place], [place]  [address] Phone: [phone]; Fax: [phone]

## 2014-10-18 NOTE — Progress Notes (Signed)
BP elevated again, Dr Excell Seltzer was paged, orders followed.

## 2014-10-18 NOTE — Progress Notes (Signed)
IV saline locked. 

## 2014-10-18 NOTE — Research (Signed)
CADLAD Informed Consent   Subject Name: Jeffrey Marquez  Subject met inclusion and exclusion criteria.  The informed consent form, study requirements and expectations were reviewed with the subject and questions and concerns were addressed prior to the signing of the consent form.  The subject verbalized understanding of the trail requirements.  The subject agreed to participate in the CADLAD trial and signed the informed consent.  The informed consent was obtained prior to performance of any protocol-specific procedures for the subject.  A copy of the signed informed consent was given to the subject and a copy was placed in the subject's medical record.  [name] 10/18/2014, 12:10

## 2014-10-18 NOTE — Progress Notes (Signed)
Patient's wife assisting patient to get dressed.

## 2014-10-18 NOTE — H&P (Signed)
Cardiology Office Note   Date: 10/18/2014   ID: [name], DOB Feb 25, 1960, MRN [id number]  PCP: Lance Bosch, NP Cardiologist: Thayer Headings, MD   Chief Complaint  Patient presents with  . Follow-up    HTN, CP    1. malignant Hypertension 2. Diabetes millitus  3. ED  History of Present Illness:  Jeffrey Marquez is a 54 yo who was are asked to see for evaluation of his marked hypertension. Jeffrey Marquez has had HTN for several years. He did not get it treated for years. Has had a headache for several weeks - typically occurs in the daytime. Also has flashes in his visual field.  Was on HCTZ but this was stopped.   No real CP or dyspnea. Slight DOE when he walks quickly.   Strong family hx of HTN No ETOH Non smoker  Avoids salt.  Uses Ms. 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 11, 2015:  Jeffrey Marquez is doing better. He is followed for his malignant HTN. We started him on HCTZ 25 a day and Coreg 6.25 BID at his last visit.  Jan. 8, 2016:  Jeffrey Marquez is a 54 yo who is followed for malignant HTN.  He has done well on his meds and diet.  Slowly improving diet.  Able to exercise and does maintaninence At [facility].   April 26, 2014:   Jeffrey Marquez is a 54 y.o. male who presents for follow up of his HTN His BP control is great. He is having some ED issues. No CP , no dyspnea.  Sept. 14, 2016: Jeffrey Marquez has had some DOE and chest tightness with walking or mowing the lawn .  Woke up this am with some chest tightness.     Past Medical History  Diagnosis Date  . Diverticulitis   . Hypertension   . Diverticula of colon 2012  . Diabetes mellitus without complication   . Obesity     No past surgical history on file.   Current Outpatient Prescriptions  Medication Sig Dispense Refill  . aspirin-acetaminophen-caffeine (EXCEDRIN MIGRAINE) 250-250-65 MG per tablet Take 2 tablets by mouth every 6 (six) hours  as needed for headache.    [name] atorvastatin (LIPITOR) 10 MG tablet Take 1 tablet (10 mg total) by mouth daily. 30 tablet 5  . Bioflavonoid Products (BIOFLEX PO) Take 1 tablet by mouth daily.    . Blood Glucose Monitoring Suppl (TRUERESULT BLOOD GLUCOSE) W/DEVICE KIT Test blood sugar 1-2 times per day 1 each 0  . carvedilol (COREG) 6.25 MG tablet Take 6.25 mg by mouth 2 (two) times daily with a meal.    . glucosamine-chondroitin 500-400 MG tablet Take 2 tablets by mouth every morning.    . hydrALAZINE (APRESOLINE) 10 MG tablet TAKE 1 TABLET BY MOUTH 3 TIMES DAILY 90 tablet 1  . Ibuprofen-Diphenhydramine Cit (ADVIL PM PO) Take 2 tablets by mouth at bedtime as needed (sleep).    . metFORMIN (GLUCOPHAGE XR) 500 MG 24 hr tablet Take 1 tablet (500 mg total) by mouth daily with breakfast. 60 tablet 4  . Multiple Vitamin (MULTIVITAMIN WITH MINERALS) TABS tablet Take 1 tablet by mouth daily.    . Omega-3 Fatty Acids (FISH OIL PO) Take 1 capsule by mouth daily.    . potassium chloride 20 MEQ TBCR Take 20 mEq by mouth daily. 90 tablet 3  . valsartan-hydrochlorothiazide (DIOVAN-HCT) 320-25 MG per tablet Take 1 tablet by mouth daily. 31 tablet 11  . sildenafil (VIAGRA) 50  MG tablet Take 1 tablet (50 mg total) by mouth as needed for erectile dysfunction. (Patient not taking: Reported on 10/18/2014) 15 tablet 3   No current facility-administered medications for this visit.    Allergies: Review of patient's allergies indicates no known allergies.    Social History: The patient  reports that he has never smoked. He has never used smokeless tobacco. He reports that he does not drink alcohol or use illicit drugs.   Family History: The patient's family history includes Diabetes in his brother and mother.    ROS: Please see the history of present illness.    Review of Systems: Constitutional:  denies fever, chills, diaphoresis,  appetite change and fatigue.  HEENT: denies photophobia, eye pain, redness, hearing loss, ear pain, congestion, sore throat, rhinorrhea, sneezing, neck pain, neck stiffness and tinnitus.  Respiratory: denies SOB, DOE, cough, chest tightness, and wheezing.  Cardiovascular: denies chest pain, palpitations and leg swelling.  Gastrointestinal: denies nausea, vomiting, abdominal pain, diarrhea, constipation, blood in stool.  Genitourinary: denies dysuria, urgency, frequency, hematuria, flank pain and difficulty urinating.  Musculoskeletal: denies myalgias, back pain, joint swelling, arthralgias and gait problem.   Skin: denies pallor, rash and wound.  Neurological: denies dizziness, seizures, syncope, weakness, light-headedness, numbness and headaches.   Hematological: denies adenopathy, easy bruising, personal or family bleeding history.  Psychiatric/ Behavioral: denies suicidal ideation, mood changes, confusion, nervousness, sleep disturbance and agitation.      All other systems are reviewed and negative.    PHYSICAL EXAM: VS: BP 190/130 mmHg  Pulse 74  Ht 5' 9.5" (1.765 m)  Wt 147.147 kg (324 lb 6.4 oz)  BMI 47.23 kg/m2 , BMI Body mass index is 47.23 kg/(m^2). GEN: Well nourished, well developed, in no acute distress  HEENT: normal  Neck: no JVD, carotid bruits, or masses Cardiac: RRR; no murmurs, rubs, or gallops,no edema  Respiratory: clear to auscultation bilaterally, normal work of breathing GI: soft, nontender, nondistended, + BS MS: no deformity or atrophy  Skin: warm and dry, no rash Neuro: Strength and sensation are intact Psych: normal   EKG: EKG is ordered today. Normal sinus rhythm at 74. He has T-wave inversions in the anterolateral leads which is new from previous EKGs.  Recent Labs: 11/18/2013: TSH 1.453 01/06/2014: ALT 18; Hemoglobin 13.6; Platelets 228 04/26/2014: BUN 15; Creatinine, Ser 1.01; Potassium 3.6; Sodium 139    Lipid  Panel  Labs (Brief)       Component Value Date/Time   CHOL 235* 11/18/2013 0929   TRIG 306* 11/18/2013 0929   HDL 35* 11/18/2013 0929   CHOLHDL 6.7 11/18/2013 0929   VLDL 61* 11/18/2013 0929   LDLCALC 139* 11/18/2013 0929       Wt Readings from Last 3 Encounters:  10/18/14 147.147 kg (324 lb 6.4 oz)  05/18/14 144.244 kg (318 lb)  04/26/14 141.069 kg (311 lb)      Other studies Reviewed: Additional studies/ records that were reviewed today include: . Review of the above records demonstrates:    ASSESSMENT AND PLAN:  1. malignant Hypertension- his blood pressure had previously been well-controlled. It's very elevated today. We'll increase the carvedilol to 12.5 mg twice a day. We will increase the hydralazine to 25 mg 3 times a day.  2. Diabetes millitus - followed by his general medical doctor.  3. Chest tightness: Patient presents today with exertional chest tightness. He's had progressive chest tightness with exertion. We will schedule him for cardiac catheterization today. We'll check precath labs. Will start  ASA, Script for NTG .   4. Erectile dysfunction: He has a script for viagra but has not taken any    Will see him in 6 weeks    Current medicines are reviewed at length with the patient today. The patient does not have concerns regarding medicines.  The following changes have been made: no change  Labs/ tests ordered today include:  No orders of the defined types were placed in this encounter.    Disposition: FU with me in 6 months    Signed, Nahser, Wonda Cheng, MD  10/18/2014 8:13 AM  [facility] [place], [place], [place] [address] Phone: [phone]; Fax: [phone]        The patient is interviewed and examined. He was seen by Dr. Acie Fredrickson in the office and sent over for cardiac catheterization in the setting of crescendo anginal symptoms. I have reviewed the  risks, indications, and alternatives to cardiac catheterization and possible PCI with the patient. Risks include but are not limited to bleeding, infection, vascular injury, stroke, myocardial infection, arrhythmia, kidney injury, radiation-related injury in the case of prolonged fluoroscopy use, emergency cardiac surgery, and death. The patient understands the risks of serious complication is low (<0%).   PCI DATA  Pre-Procedure Data: Indication: Exertional angina Angina Class (CCS): 3 Pre Procedure Stress Test: No           If yes, Stress Test Risk Stratification:  Antianginal Rx (greater than or equal to 2 antianginals): 1 drug (coreg)  Sherren Mocha 10/18/2014 2:08 PM

## 2014-10-19 ENCOUNTER — Encounter (HOSPITAL_COMMUNITY): Payer: Self-pay | Admitting: Cardiovascular Disease

## 2014-12-01 ENCOUNTER — Ambulatory Visit (INDEPENDENT_AMBULATORY_CARE_PROVIDER_SITE_OTHER): Payer: Self-pay | Admitting: Cardiovascular Disease

## 2014-12-01 ENCOUNTER — Encounter: Payer: Self-pay | Admitting: Cardiovascular Disease

## 2014-12-01 VITALS — BP 130/98 | HR 67 | Ht 69.5 in | Wt 317.8 lb

## 2014-12-01 DIAGNOSIS — E785 Hyperlipidemia, unspecified: Secondary | ICD-10-CM

## 2014-12-01 DIAGNOSIS — I10 Essential (primary) hypertension: Secondary | ICD-10-CM

## 2014-12-01 NOTE — Progress Notes (Signed)
Cardiology Office Note   Date:  12/01/2014   ID:  Jeffrey Marquez, DOB [birth year], MRN [id number]  PCP:  Lance Bosch, NP  Cardiologist:   Thayer Headings, MD   Chief Complaint  Patient presents with  . Hypertension   1. malignant Hypertension 2. Diabetes millitus  3. ED  History of Present Illness:  Jeffrey Marquez is a 54 yo who was are asked to see for evaluation of his marked hypertension. Jeffrey Marquez has had HTN for several years. He did not get it treated for years. Has had a headache for several weeks - typically occurs in the daytime. Also has flashes in his visual field.  Was on HCTZ but this was stopped.   No real CP or dyspnea. Slight DOE when he walks quickly.   Strong family hx of HTN No ETOH Non smoker  Avoids salt.  Uses Ms. 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 11, 2015:  Jeffrey Marquez is doing better. He is followed for his malignant HTN. We started him on HCTZ 25 a day and Coreg 6.25 BID at his last visit.  Jan. 8, 2016:  Jeffrey Marquez is a 54 yo who is followed for malignant HTN.  He has done well on his meds and diet.  Slowly improving diet.  Able to exercise and does maintaninence At [employer].    April 26, 2014:   Jeffrey Marquez is a 54 y.o. male who presents for follow up of his HTN His BP control is great. He is having some ED issues.   No CP , no dyspnea.  Sept. 14, 2016: Jeffrey Marquez has had some DOE and chest tightness with walking or mowing the lawn .  Woke up this am with some chest tightness.   Oct. 28, 2016:  Jeffrey Marquez is seen today for follow up . Wants to discuss taking  Viagra .   Past Medical History  Diagnosis Date  . Diverticulitis   . Hypertension   . Diverticula of colon 2012  . Diabetes mellitus without complication   . Obesity     Past Surgical History  Procedure Laterality Date  . Cardiac catheterization N/A 10/18/2014    Procedure: Left Heart Cath and Coronary Angiography;  Surgeon: Sherren Mocha, MD;  Location: [facility];  Service: Cardiovascular;  Laterality: N/A;     Current Outpatient Prescriptions  Medication Sig Dispense Refill  . aspirin-acetaminophen-caffeine (EXCEDRIN MIGRAINE) 250-250-65 MG per tablet Take 2 tablets by mouth every 6 (six) hours as needed for headache.    [name] atorvastatin (LIPITOR) 10 MG tablet Take 1 tablet (10 mg total) by mouth daily. 30 tablet 5  . Bioflavonoid Products (BIOFLEX PO) Take 1 tablet by mouth daily.    . Blood Glucose Monitoring Suppl (TRUERESULT BLOOD GLUCOSE) W/DEVICE KIT Test blood sugar 1-2 times per day 1 each 0  . carvedilol (COREG) 12.5 MG tablet Take 1 tablet (12.5 mg total) by mouth 2 (two) times daily. 180 tablet 3  . glucosamine-chondroitin 500-400 MG tablet Take 2 tablets by mouth every morning.    . hydrALAZINE (APRESOLINE) 25 MG tablet Take 1 tablet (25 mg total) by mouth 3 (three) times daily. 270 tablet 3  . Ibuprofen-Diphenhydramine Cit (ADVIL PM PO) Take 2 tablets by mouth at bedtime as needed (sleep).    . metFORMIN (GLUCOPHAGE XR) 500 MG 24 hr tablet Take 1 tablet (500 mg total) by mouth daily with breakfast. 60 tablet 4  . Multiple Vitamin (MULTIVITAMIN WITH MINERALS)  TABS tablet Take 1 tablet by mouth daily.    . nitroGLYCERIN (NITROSTAT) 0.4 MG SL tablet Place 1 tablet (0.4 mg total) under the tongue every 5 (five) minutes as needed for chest pain. 25 tablet 6  . Omega-3 Fatty Acids (FISH OIL PO) Take 1 capsule by mouth daily.    . potassium chloride 20 MEQ TBCR Take 20 mEq by mouth daily. 90 tablet 3  . sildenafil (VIAGRA) 50 MG tablet Take 1 tablet (50 mg total) by mouth as needed for erectile dysfunction. (Patient not taking: Reported on 10/18/2014) 15 tablet 3  . valsartan-hydrochlorothiazide (DIOVAN-HCT) 320-25 MG per tablet Take 1 tablet by mouth daily. 31 tablet 11   No current facility-administered medications for this visit.    Allergies:   Review of patient's allergies indicates no known allergies.    Social History:   The patient  reports that he has never smoked. He has never used smokeless tobacco. He reports that he does not drink alcohol or use illicit drugs.   Family History:  The patient's family history includes Diabetes in his brother and mother.    ROS:  Please see the history of present illness.    Review of Systems: Constitutional:  denies fever, chills, diaphoresis, appetite change and fatigue.  HEENT: denies photophobia, eye pain, redness, hearing loss, ear pain, congestion, sore throat, rhinorrhea, sneezing, neck pain, neck stiffness and tinnitus.  Respiratory: denies SOB, DOE, cough, chest tightness, and wheezing.  Cardiovascular: denies chest pain, palpitations and leg swelling.  Gastrointestinal: denies nausea, vomiting, abdominal pain, diarrhea, constipation, blood in stool.  Genitourinary: denies dysuria, urgency, frequency, hematuria, flank pain and difficulty urinating.  Musculoskeletal: denies  myalgias, back pain, joint swelling, arthralgias and gait problem.   Skin: denies pallor, rash and wound.  Neurological: denies dizziness, seizures, syncope, weakness, light-headedness, numbness and headaches.   Hematological: denies adenopathy, easy bruising, personal or family bleeding history.  Psychiatric/ Behavioral: denies suicidal ideation, mood changes, confusion, nervousness, sleep disturbance and agitation.       All other systems are reviewed and negative.    PHYSICAL EXAM: VS:  There were no vitals taken for this visit. , BMI There is no weight on file to calculate BMI. GEN: Well nourished, well developed, in no acute distress HEENT: normal Neck: no JVD, carotid bruits, or masses Cardiac: RRR; no murmurs, rubs, or gallops,no edema  Respiratory:  clear to auscultation bilaterally, normal work of breathing GI: soft, nontender, nondistended, + BS MS: no deformity or atrophy Skin: warm and dry, no rash Neuro:  Strength and sensation are intact Psych: normal   EKG:  EKG  is ordered today. Normal sinus rhythm at 74. He has T-wave inversions in the anterolateral leads which is new from previous EKGs.  Recent Labs: 01/06/2014: ALT 18 10/18/2014: BUN 13; Creatinine, Ser 1.06; Hemoglobin 14.8; Platelets 223.0; Potassium 3.8; Sodium 142    Lipid Panel    Component Value Date/Time   CHOL 235* 11/18/2013 0929   TRIG 306* 11/18/2013 0929   HDL 35* 11/18/2013 0929   CHOLHDL 6.7 11/18/2013 0929   VLDL 61* 11/18/2013 0929   LDLCALC 139* 11/18/2013 0929      Wt Readings from Last 3 Encounters:  10/18/14 324 lb (146.965 kg)  10/18/14 324 lb 6.4 oz (147.147 kg)  05/18/14 318 lb (144.244 kg)      Other studies Reviewed: Additional studies/ records that were reviewed today include: . Review of the above records demonstrates:    ASSESSMENT AND PLAN:  1.  malignant Hypertension- fairly well controlled.  Diastolic is still elevated.   Encouraged continued diet and weight loss program.  2. Diabetes millitus - followed by his general medical doctor.  3. Chest tightness: [place] has not had any recurrent CP.  Had minimal CAD by cath .     4. Erectile dysfunction:    He has a script for viagra but has not taken any     Will see him in 3 months    Current medicines are reviewed at length with the patient today.  The patient does not have concerns regarding medicines.  The following changes have been made:  no change  Labs/ tests ordered today include:  No orders of the defined types were placed in this encounter.         Signed, Nahser, Wonda Cheng, MD  12/01/2014 7:40 AM    [facility] [address], [place], [place]  [address] Phone: [phone]; Fax: [phone]

## 2014-12-01 NOTE — Patient Instructions (Signed)
Medication Instructions:  Your physician recommends that you continue on your current medications as directed. Please refer to the Current Medication list given to you today.   Labwork: Your physician recommends that you return for lab work in: 3 months on the day of or a few days before your office visit with Dr. Nahser.  You will need to FAST for this appointment - nothing to eat or drink after midnight the night before except water.   Testing/Procedures: None Ordered   Follow-Up: Your physician recommends that you schedule a follow-up appointment in: 3 months with Dr. Nahser   If you need a refill on your cardiac medications before your next appointment, please call your pharmacy.   Thank you for choosing [facility]! Michelle Swinyer, RN [phone]    

## 2014-12-28 ENCOUNTER — Emergency Department (HOSPITAL_COMMUNITY)
Admission: EM | Admit: 2014-12-28 | Discharge: 2014-12-28 | Disposition: A | Discharge status: Home / Self Care | Payer: Self-pay | Attending: Emergency Medicine | Admitting: Emergency Medicine

## 2014-12-28 ENCOUNTER — Encounter (HOSPITAL_COMMUNITY): Payer: Self-pay | Admitting: *Deleted

## 2014-12-28 DIAGNOSIS — M79674 Pain in right toe(s): Secondary | ICD-10-CM | POA: Insufficient documentation

## 2014-12-28 DIAGNOSIS — E669 Obesity, unspecified: Secondary | ICD-10-CM | POA: Insufficient documentation

## 2014-12-28 DIAGNOSIS — E119 Type 2 diabetes mellitus without complications: Secondary | ICD-10-CM | POA: Insufficient documentation

## 2014-12-28 DIAGNOSIS — Z79899 Other long term (current) drug therapy: Secondary | ICD-10-CM | POA: Insufficient documentation

## 2014-12-28 DIAGNOSIS — Z8719 Personal history of other diseases of the digestive system: Secondary | ICD-10-CM | POA: Insufficient documentation

## 2014-12-28 DIAGNOSIS — Z9889 Other specified postprocedural states: Secondary | ICD-10-CM | POA: Insufficient documentation

## 2014-12-28 DIAGNOSIS — I10 Essential (primary) hypertension: Secondary | ICD-10-CM | POA: Insufficient documentation

## 2014-12-28 DIAGNOSIS — Z7984 Long term (current) use of oral hypoglycemic drugs: Secondary | ICD-10-CM | POA: Insufficient documentation

## 2014-12-28 MED ORDER — INDOMETHACIN 25 MG PO CAPS: 25.0000 mg | ORAL_CAPSULE | Freq: Three times a day (TID) | ORAL | Status: DC | PRN

## 2014-12-28 MED ORDER — PREDNISONE 20 MG PO TABS
60.0000 mg | ORAL_TABLET | Freq: Once | ORAL | Status: AC
  Administered 2014-12-28: 60 mg via ORAL
  Filled 2014-12-28: qty 3

## 2014-12-28 MED ORDER — HYDRALAZINE HCL 25 MG PO TABS
25.0000 mg | ORAL_TABLET | Freq: Once | ORAL | Status: AC
  Administered 2014-12-28: 25 mg via ORAL
  Filled 2014-12-28: qty 1

## 2014-12-28 MED ORDER — CARVEDILOL 12.5 MG PO TABS
12.5000 mg | ORAL_TABLET | Freq: Two times a day (BID) | ORAL | Status: DC
  Administered 2014-12-28: 12.5 mg via ORAL
  Filled 2014-12-28 (×3): qty 1

## 2014-12-28 MED ORDER — COLCHICINE 0.6 MG PO TABS: 0.6000 mg | ORAL_TABLET | Freq: Two times a day (BID) | ORAL | Status: DC

## 2014-12-28 MED ORDER — INDOMETHACIN 25 MG PO CAPS
25.0000 mg | ORAL_CAPSULE | Freq: Once | ORAL | Status: AC
  Administered 2014-12-28: 25 mg via ORAL
  Filled 2014-12-28: qty 1

## 2014-12-28 NOTE — Discharge Instructions (Signed)
Your toe pain may be due to gout.  Take medications as prescribed.  Follow instruction below.  Follow up with your doctor or with podiatrist if worsen.  Return if you have any concerns. Your blood pressure is elevated today.  Take your blood pressure medication as prescribed and have it recheck at your earliest convenient.    Gout Gout is an inflammatory arthritis caused by a buildup of uric acid crystals in the joints. Uric acid is a chemical that is normally present in the blood. When the level of uric acid in the blood is too high it can form crystals that deposit in your joints and tissues. This causes joint redness, soreness, and swelling (inflammation). Repeat attacks are common. Over time, uric acid crystals can form into masses (tophi) near a joint, destroying bone and causing disfigurement. Gout is treatable and often preventable. CAUSES  The disease begins with elevated levels of uric acid in the blood. Uric acid is produced by your body when it breaks down a naturally found substance called purines. Certain foods you eat, such as meats and fish, contain high amounts of purines. Causes of an elevated uric acid level include:  Being passed down from parent to child (heredity).  Diseases that cause increased uric acid production (such as obesity, psoriasis, and certain cancers).  Excessive alcohol use.  Diet, especially diets rich in meat and seafood.  Medicines, including certain cancer-fighting medicines (chemotherapy), water pills (diuretics), and aspirin.  Chronic kidney disease. The kidneys are no longer able to remove uric acid well.  Problems with metabolism. Conditions strongly associated with gout include:  Obesity.  High blood pressure.  High cholesterol.  Diabetes. Not everyone with elevated uric acid levels gets gout. It is not understood why some people get gout and others do not. Surgery, joint injury, and eating too much of certain foods are some of the factors  that can lead to gout attacks. SYMPTOMS   An attack of gout comes on quickly. It causes intense pain with redness, swelling, and warmth in a joint.  Fever can occur.  Often, only one joint is involved. Certain joints are more commonly involved:  Base of the big toe.  Knee.  Ankle.  Wrist.  Finger. Without treatment, an attack usually goes away in a few days to weeks. Between attacks, you usually will not have symptoms, which is different from many other forms of arthritis. DIAGNOSIS  Your caregiver will suspect gout based on your symptoms and exam. In some cases, tests may be recommended. The tests may include:  Blood tests.  Urine tests.  X-rays.  Joint fluid exam. This exam requires a needle to remove fluid from the joint (arthrocentesis). Using a microscope, gout is confirmed when uric acid crystals are seen in the joint fluid. TREATMENT  There are two phases to gout treatment: treating the sudden onset (acute) attack and preventing attacks (prophylaxis).  Treatment of an Acute Attack.  Medicines are used. These include anti-inflammatory medicines or steroid medicines.  An injection of steroid medicine into the affected joint is sometimes necessary.  The painful joint is rested. Movement can worsen the arthritis.  You may use warm or cold treatments on painful joints, depending which works best for you.  Treatment to Prevent Attacks.  If you suffer from frequent gout attacks, your caregiver may advise preventive medicine. These medicines are started after the acute attack subsides. These medicines either help your kidneys eliminate uric acid from your body or decrease your uric acid production.  You may need to stay on these medicines for a very long time.  The early phase of treatment with preventive medicine can be associated with an increase in acute gout attacks. For this reason, during the first few months of treatment, your caregiver may also advise you to take  medicines usually used for acute gout treatment. Be sure you understand your caregiver's directions. Your caregiver may make several adjustments to your medicine dose before these medicines are effective.  Discuss dietary treatment with your caregiver or dietitian. Alcohol and drinks high in sugar and fructose and foods such as meat, poultry, and seafood can increase uric acid levels. Your caregiver or dietitian can advise you on drinks and foods that should be limited. HOME CARE INSTRUCTIONS   Do not take aspirin to relieve pain. This raises uric acid levels.  Only take over-the-counter or prescription medicines for pain, discomfort, or fever as directed by your caregiver.  Rest the joint as much as possible. When in bed, keep sheets and blankets off painful areas.  Keep the affected joint raised (elevated).  Apply warm or cold treatments to painful joints. Use of warm or cold treatments depends on which works best for you.  Use crutches if the painful joint is in your leg.  Drink enough fluids to keep your urine clear or pale yellow. This helps your body get rid of uric acid. Limit alcohol, sugary drinks, and fructose drinks.  Follow your dietary instructions. Pay careful attention to the amount of protein you eat. Your daily diet should emphasize fruits, vegetables, whole grains, and fat-free or low-fat milk products. Discuss the use of coffee, vitamin C, and cherries with your caregiver or dietitian. These may be helpful in lowering uric acid levels.  Maintain a healthy body weight. SEEK MEDICAL CARE IF:   You develop diarrhea, vomiting, or any side effects from medicines.  You do not feel better in 24 hours, or you are getting worse. SEEK IMMEDIATE MEDICAL CARE IF:   Your joint becomes suddenly more tender, and you have chills or a fever. MAKE SURE YOU:   Understand these instructions.  Will watch your condition.  Will get help right away if you are not doing well or get  worse.   This information is not intended to replace advice given to you by your health care provider. Make sure you discuss any questions you have with your health care provider.   Document Released: 01/18/2000 Document Revised: 02/10/2014 Document Reviewed: 09/03/2011 Elsevier Interactive Patient Education [employer]

## 2014-12-28 NOTE — ED Notes (Signed)
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 Pt states most of the pain is in his big toe and it is hard to walk.

## 2014-12-28 NOTE — ED Notes (Signed)
Pt is in stable condition upon d/c and ambulates from ED. 

## 2014-12-28 NOTE — ED Notes (Signed)
Called pharmacy to clear meds so the pt can have BP meds and leave. Pt has been d/c.

## 2014-12-28 NOTE — ED Notes (Signed)
Pt states he is on BP meds, but is currently noncompliant. Pt educated on the importance of taking prescribed medications.

## 2014-12-28 NOTE — ED Provider Notes (Signed)
CSN: [id number]     Arrival date & time 12/28/14  0102 History   First MD Initiated Contact with Patient 12/28/14 0734     No chief complaint on file.    (Consider location/radiation/quality/duration/timing/severity/associated sxs/prior Treatment) HPI   54 year old obese male with history of non-insulin-dependent diabetes and hypertension presenting for evaluation of R great toe pain.  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  He described pain as an acute onset sharp achy sensation worsening with ambulation. He rates pain as moderate in intensity. He denies any specific injury. Denies any numbness, ankle pain, knee pain, fever or chills, or rash. Denies any prior history of gout. No specific treatment tried. Patient has no other complaint.  Past Medical History  Diagnosis Date  . Diverticulitis   . Hypertension   . Diverticula of colon 2012  . Diabetes mellitus without complication ([place])   . Obesity    Past Surgical History  Procedure Laterality Date  . Cardiac catheterization N/A 10/18/2014    Procedure: Left Heart Cath and Coronary Angiography;  Surgeon: Sherren Mocha, MD;  Location: [facility];  Service: Cardiovascular;  Laterality: N/A;   Family History  Problem Relation Age of Onset  . Diabetes Mother   . Diabetes Brother    Social History  Substance Use Topics  . Smoking status: Never Smoker   . Smokeless tobacco: Never Used  . Alcohol Use: No    Review of Systems  Constitutional: Negative for fever.  Musculoskeletal: Positive for arthralgias.  Skin: Negative for rash and wound.  Neurological: Negative for numbness.      Allergies  Review of patient's allergies indicates no known allergies.  Home Medications   Prior to Admission medications   Medication Sig Start Date End Date Taking? Authorizing Provider  aspirin-acetaminophen-caffeine (EXCEDRIN  MIGRAINE) [phone] MG per tablet Take 2 tablets by mouth every 6 (six) hours as needed for headache.    Historical Provider, MD  atorvastatin (LIPITOR) 10 MG tablet Take 1 tablet (10 mg total) by mouth daily. 05/18/14   Lance Bosch, NP  Bioflavonoid Products (BIOFLEX PO) Take 1 tablet by mouth daily.    Historical Provider, MD  Blood Glucose Monitoring Suppl (TRUERESULT BLOOD GLUCOSE) W/DEVICE KIT Test blood sugar 1-2 times per day 12/02/13   Lance Bosch, NP  carvedilol (COREG) 12.5 MG tablet Take 1 tablet (12.5 mg total) by mouth 2 (two) times daily. 10/18/14   Thayer Headings, MD  glucosamine-chondroitin 500-400 MG tablet Take 2 tablets by mouth every morning.    Historical Provider, MD  hydrALAZINE (APRESOLINE) 25 MG tablet Take 1 tablet (25 mg total) by mouth 3 (three) times daily. 10/18/14   Thayer Headings, MD  Ibuprofen-Diphenhydramine Cit (ADVIL PM PO) Take 2 tablets by mouth at bedtime as needed (sleep).    Historical Provider, MD  metFORMIN (GLUCOPHAGE XR) 500 MG 24 hr tablet Take 1 tablet (500 mg total) by mouth daily with breakfast. 05/18/14   Lance Bosch, NP  Multiple Vitamin (MULTIVITAMIN WITH MINERALS) TABS tablet Take 1 tablet by mouth daily.    Historical Provider, MD  nitroGLYCERIN (NITROSTAT) 0.4 MG SL tablet Place 1 tablet (0.4 mg total) under the tongue every 5 (five) minutes as needed for chest pain. 10/18/14   Thayer Headings, MD  Omega-3 Fatty Acids (FISH OIL PO) Take 1 capsule by mouth daily.  Historical Provider, MD  potassium chloride 20 MEQ TBCR Take 20 mEq by mouth daily. 01/10/14   Thayer Headings, MD  valsartan-hydrochlorothiazide (DIOVAN-HCT) 320-25 MG per tablet Take 1 tablet by mouth daily. 01/13/14   Thayer Headings, MD   There were no vitals taken for this visit. Physical Exam  Constitutional: He appears well-developed and well-nourished. No distress.  Obese African-American male in no acute distress, nontoxic  HENT:  Head: Atraumatic.  Eyes:  Conjunctivae are normal.  Neck: Neck supple.  Musculoskeletal: He exhibits tenderness (Right foot: Point tenderness to first MTP on palpation without any obvious swelling redness or signs of infection. Brisk cap refill and normal nail color).  Neurological: He is alert.  Skin: No rash noted.  Psychiatric: He has a normal mood and affect.  Nursing note and vitals reviewed.   ED Course  Procedures (including critical care time)  Patient with atraumatic pain to right great toe at the MTP joint. Suspicious gout. No signs of infection on exam. No injury concerning for fractures or dislocation at this time. Patient will benefit from a course of steroid and indomethacin.  Patient has an elevated blood pressure of 192/122. He has history of hypertension. He did not take his medication today. He is currently asymptomatic. Pain certainly can attribute to his elevated blood pressure. Low suspicion for hypertensive emergency given no clinical symptoms. We'll manage pain and will recheck vital sign.    Labs Review Labs Reviewed - No data to display  Imaging Review No results found. I have personally reviewed and evaluated these images and lab results as part of my medical decision-making.   EKG Interpretation None      MDM   Final diagnoses:  Pain of great toe, right    BP 188/109 mmHg  Pulse 72  Temp(Src) 98.4 F (36.9 C) (Oral)  Resp 16  SpO2 99%     Domenic Moras, PA-C 12/28/14 [place], MD 12/28/14 1546

## 2015-02-04 ENCOUNTER — Emergency Department (HOSPITAL_COMMUNITY)
Admission: EM | Admit: 2015-02-04 | Discharge: 2015-02-05 | Disposition: A | Discharge status: Other Acute Inpatient Hospital | Payer: Self-pay | Attending: Emergency Medicine | Admitting: Emergency Medicine

## 2015-02-04 ENCOUNTER — Encounter (HOSPITAL_COMMUNITY): Payer: Self-pay | Admitting: Emergency Medicine

## 2015-02-04 DIAGNOSIS — I10 Essential (primary) hypertension: Secondary | ICD-10-CM | POA: Insufficient documentation

## 2015-02-04 DIAGNOSIS — T405X2A Poisoning by cocaine, intentional self-harm, initial encounter: Secondary | ICD-10-CM | POA: Insufficient documentation

## 2015-02-04 DIAGNOSIS — E119 Type 2 diabetes mellitus without complications: Secondary | ICD-10-CM | POA: Insufficient documentation

## 2015-02-04 DIAGNOSIS — T1491XA Suicide attempt, initial encounter: Secondary | ICD-10-CM

## 2015-02-04 DIAGNOSIS — Y9389 Activity, other specified: Secondary | ICD-10-CM | POA: Insufficient documentation

## 2015-02-04 DIAGNOSIS — E669 Obesity, unspecified: Secondary | ICD-10-CM | POA: Insufficient documentation

## 2015-02-04 DIAGNOSIS — Z79899 Other long term (current) drug therapy: Secondary | ICD-10-CM | POA: Insufficient documentation

## 2015-02-04 DIAGNOSIS — Z8719 Personal history of other diseases of the digestive system: Secondary | ICD-10-CM | POA: Insufficient documentation

## 2015-02-04 DIAGNOSIS — X58XXXA Exposure to other specified factors, initial encounter: Secondary | ICD-10-CM | POA: Insufficient documentation

## 2015-02-04 DIAGNOSIS — Z9889 Other specified postprocedural states: Secondary | ICD-10-CM | POA: Insufficient documentation

## 2015-02-04 DIAGNOSIS — Y998 Other external cause status: Secondary | ICD-10-CM | POA: Insufficient documentation

## 2015-02-04 DIAGNOSIS — Y9289 Other specified places as the place of occurrence of the external cause: Secondary | ICD-10-CM | POA: Insufficient documentation

## 2015-02-04 HISTORY — DX: Other psychoactive substance abuse, uncomplicated: F19.10

## 2015-02-04 HISTORY — DX: Alcohol abuse, uncomplicated: F10.10

## 2015-02-04 LAB — ACETAMINOPHEN LEVEL: Acetaminophen (Tylenol), Serum: <10 ug/mL — ABNORMAL LOW (ref 10–30)

## 2015-02-04 LAB — COMPREHENSIVE METABOLIC PANEL
ALT: 28 U/L (ref 17–63)
AST: 19 U/L (ref 15–41)
Albumin: 3.8 g/dL (ref 3.5–5.0)
Alkaline Phosphatase: 83 U/L (ref 38–126)
Anion gap: 11 (ref 5–15)
BILIRUBIN TOTAL: 1 mg/dL (ref 0.3–1.2)
BUN: 12 mg/dL (ref 6–20)
CHLORIDE: 105 mmol/L (ref 101–111)
CO2: 25 mmol/L (ref 22–32)
CREATININE: 1.25 mg/dL — AB (ref 0.61–1.24)
Calcium: 9.6 mg/dL (ref 8.9–10.3)
Glucose, Bld: 109 mg/dL — ABNORMAL HIGH (ref 65–99)
POTASSIUM: 3.5 mmol/L (ref 3.5–5.1)
Sodium: 141 mmol/L (ref 135–145)
TOTAL PROTEIN: 7.8 g/dL (ref 6.5–8.1)

## 2015-02-04 LAB — CBC
HCT: 46.7 % (ref 39.0–52.0)
Hemoglobin: 15.6 g/dL (ref 13.0–17.0)
MCH: 29.9 pg (ref 26.0–34.0)
MCHC: 33.4 g/dL (ref 30.0–36.0)
MCV: 89.6 fL (ref 78.0–100.0)
PLATELETS: 239 10*3/uL (ref 150–400)
RBC: 5.21 MIL/uL (ref 4.22–5.81)
RDW: 13.5 % (ref 11.5–15.5)
WBC: 11.6 10*3/uL — AB (ref 4.0–10.5)

## 2015-02-04 LAB — ETHANOL

## 2015-02-04 LAB — SALICYLATE LEVEL

## 2015-02-04 NOTE — ED Notes (Addendum)
C/o suicidal ideation x 2 days.  Reports drank 3 pints of gin this morning. Last etoh at 9:30am.  Last snorted cocaine at 1pm today.  Pt [facility] and was transported to ED via [employer].  Per [facility], they do not have any beds for pt to come back there.  Pt states he has "a lot going on" but doesn't want to discuss it.  Per [employer] officer that brought pt in, contact off-duty [employer] officer when pt ready to be discharged.

## 2015-02-04 NOTE — ED Provider Notes (Signed)
CSN: [id number]     Arrival date & time 02/04/15  1857 History   First MD Initiated Contact with Patient 02/04/15 2025     Chief Complaint  Patient presents with  . Suicidal     (Consider location/radiation/quality/duration/timing/severity/associated sxs/prior Treatment) The history is provided by the patient (And IVC paperwork ).     Pt with hx DM, HTN, polysubstance abuse brought to ED under IVC for SI and suicide attempt.  States he feels awful mentally and physically and feels like this is the end for him.  He tried to kill himself today by drinking 3 pints of gin, using cocaine, and taking a handful of his home medications, "the red and grey pills."  The pill ingestion occurred at 11am and he states he threw up soon after and saw it was mostly pill fragments.  States it was dark and the pill had red on it, thought it might be tylenol but he's unsure.  Has not been eating or drinking well.  Has not been taking his home medications otherwise.  States he went to [place] today because he felt like walking out in front of a moving vehicle.    Past Medical History  Diagnosis Date  . Diverticulitis   . Hypertension   . Diverticula of colon 2012  . Diabetes mellitus without complication ([place])   . Obesity   . Drug abuse   . Alcohol abuse    Past Surgical History  Procedure Laterality Date  . Cardiac catheterization N/A 10/18/2014    Procedure: Left Heart Cath and Coronary Angiography;  Surgeon: Sherren Mocha, MD;  Location: [facility];  Service: Cardiovascular;  Laterality: N/A;   Family History  Problem Relation Age of Onset  . Diabetes Mother   . Diabetes Brother    Social History  Substance Use Topics  . Smoking status: Never Smoker   . Smokeless tobacco: Never Used  . Alcohol Use: Yes    Review of Systems  All other systems reviewed and are negative.     Allergies  Review of patient's allergies indicates no known allergies.  Home Medications   Prior to  Admission medications   Medication Sig Start Date End Date Taking? Authorizing Provider  aspirin-acetaminophen-caffeine (EXCEDRIN MIGRAINE) [phone] MG per tablet Take 2 tablets by mouth every 6 (six) hours as needed for headache.    Historical Provider, MD  atorvastatin (LIPITOR) 10 MG tablet Take 1 tablet (10 mg total) by mouth daily. Patient not taking: Reported on 02/04/2015 05/18/14   Lance Bosch, NP  Blood Glucose Monitoring Suppl (TRUERESULT BLOOD GLUCOSE) W/DEVICE KIT Test blood sugar 1-2 times per day 12/02/13   Lance Bosch, NP  carvedilol (COREG) 12.5 MG tablet Take 1 tablet (12.5 mg total) by mouth 2 (two) times daily. Patient not taking: Reported on 02/04/2015 10/18/14   Thayer Headings, MD  colchicine 0.6 MG tablet Take 1 tablet (0.6 mg total) by mouth 2 (two) times daily. Patient not taking: Reported on 02/04/2015 12/28/14   Domenic Moras, PA-C  hydrALAZINE (APRESOLINE) 10 MG tablet Take 10 mg by mouth 3 (three) times daily. Reported on 02/04/2015    Historical Provider, MD  hydrALAZINE (APRESOLINE) 25 MG tablet Take 1 tablet (25 mg total) by mouth 3 (three) times daily. 10/18/14   Thayer Headings, MD  indomethacin (INDOCIN) 25 MG capsule Take 1 capsule (25 mg total) by mouth 3 (three) times daily as needed. Patient not taking: Reported on 02/04/2015 12/28/14   Gertie Fey  Rona Ravens, PA-C  metFORMIN (GLUCOPHAGE XR) 500 MG 24 hr tablet Take 1 tablet (500 mg total) by mouth daily with breakfast. Patient not taking: Reported on 02/04/2015 05/18/14   Lance Bosch, NP  nitroGLYCERIN (NITROSTAT) 0.4 MG SL tablet Place 1 tablet (0.4 mg total) under the tongue every 5 (five) minutes as needed for chest pain. Patient not taking: Reported on 02/04/2015 10/18/14   Thayer Headings, MD  potassium chloride 20 MEQ TBCR Take 20 mEq by mouth daily. Patient not taking: Reported on 02/04/2015 01/10/14   Thayer Headings, MD  valsartan-hydrochlorothiazide (DIOVAN-HCT) 320-25 MG per tablet Take 1 tablet by mouth daily. Patient  not taking: Reported on 02/04/2015 01/13/14   Thayer Headings, MD   BP 179/119 mmHg  Pulse 92  Temp(Src) 98.4 F (36.9 C) (Oral)  Resp 16  Ht 5' 9.5" (1.765 m)  Wt 127.659 kg  BMI 40.98 kg/m2  SpO2 94% Physical Exam  Constitutional: He appears well-developed and well-nourished. No distress.  HENT:  Head: Normocephalic and atraumatic.  Neck: Neck supple.  Cardiovascular: Normal rate and regular rhythm.   Pulmonary/Chest: Effort normal and breath sounds normal. No respiratory distress. He has no wheezes. He has no rales.  Abdominal: Soft. Bowel sounds are normal. He exhibits no distension and no mass. There is no tenderness. There is no rebound and no guarding.  Neurological: He is alert. He exhibits normal muscle tone.  Skin: He is not diaphoretic.  Psychiatric: His speech is normal. He exhibits a depressed mood. He expresses suicidal ideation. He expresses suicidal plans.  Nursing note and vitals reviewed.   ED Course  Procedures (including critical care time) Labs Review Labs Reviewed  COMPREHENSIVE METABOLIC PANEL - Abnormal; Notable for the following:    Glucose, Bld 109 (*)    Creatinine, Ser 1.25 (*)    All other components within normal limits  ACETAMINOPHEN LEVEL - Abnormal; Notable for the following:    Acetaminophen (Tylenol), Serum <10 (*)    All other components within normal limits  CBC - Abnormal; Notable for the following:    WBC 11.6 (*)    All other components within normal limits  ACETAMINOPHEN LEVEL - Abnormal; Notable for the following:    Acetaminophen (Tylenol), Serum <10 (*)    All other components within normal limits  ETHANOL  SALICYLATE LEVEL  URINE RAPID DRUG SCREEN, HOSP PERFORMED    Imaging Review No results found. I have personally reviewed and evaluated these images and lab results as part of my medical decision-making.   EKG Interpretation None       ED ECG REPORT   Date: 02/04/2015  Rate: 75  Rhythm: normal sinus rhythm  QRS  Axis: left  Intervals: normal  ST/T Wave abnormalities: nonspecific T wave changes and Q waves  Conduction Disutrbances:none  Narrative Interpretation:   Old EKG Reviewed: none available  I have personally reviewed the EKG tracing and agree with the computerized printout as noted.   4:52 AM Pt seen by behavioral health recommend inpatient, meets criteria.  No beds available at [facility].  Will look for placement.    MDM   Final diagnoses:  Suicide attempt ([place])    Afebrile nontoxic pt with suicidal ideation and suicide attempt with alcohol, cocaine, and unknown home medications. Drs Kathrynn Humble and Lita Mains made aware.   Labs remarkable for elevated creatinine (1.2), mild leukocytosis (11.6), mild hyperglycemia (109).  HTN in ED but unclear what pt overdosed on, HTN improved with time.  VS remained  steady.  Denies CP, SOB, HA.  No AMS.  EKG unremarkable.  Monitored in ED. Seen by TTS, placement pending.  Pt remains under IVC.     [place], PA-C 02/05/15 [place], MD 02/07/15 2328

## 2015-02-04 NOTE — ED Notes (Signed)
Belongs bagged, and removed form patient.

## 2015-02-05 ENCOUNTER — Encounter (HOSPITAL_COMMUNITY): Payer: Self-pay | Admitting: *Deleted

## 2015-02-05 ENCOUNTER — Inpatient Hospital Stay (HOSPITAL_COMMUNITY)
Admission: AD | Admit: 2015-02-05 | Discharge: 2015-02-14 | DRG: 885 | Disposition: A | Discharge status: Home / Self Care | Payer: Federal, State, Local not specified - Other | Source: Intra-hospital | Attending: Psychiatry | Admitting: Psychiatry

## 2015-02-05 DIAGNOSIS — E119 Type 2 diabetes mellitus without complications: Secondary | ICD-10-CM | POA: Diagnosis present

## 2015-02-05 DIAGNOSIS — Z833 Family history of diabetes mellitus: Secondary | ICD-10-CM | POA: Diagnosis not present

## 2015-02-05 DIAGNOSIS — F332 Major depressive disorder, recurrent severe without psychotic features: Secondary | ICD-10-CM | POA: Diagnosis present

## 2015-02-05 DIAGNOSIS — Z915 Personal history of self-harm: Secondary | ICD-10-CM

## 2015-02-05 DIAGNOSIS — I10 Essential (primary) hypertension: Secondary | ICD-10-CM | POA: Diagnosis present

## 2015-02-05 DIAGNOSIS — R45851 Suicidal ideations: Secondary | ICD-10-CM | POA: Diagnosis not present

## 2015-02-05 LAB — RAPID URINE DRUG SCREEN, HOSP PERFORMED
Amphetamines: NOT DETECTED
BARBITURATES: NOT DETECTED
Benzodiazepines: NOT DETECTED
Cocaine: POSITIVE — AB
Opiates: NOT DETECTED
Tetrahydrocannabinol: NOT DETECTED

## 2015-02-05 LAB — ACETAMINOPHEN LEVEL

## 2015-02-05 MED ORDER — ATORVASTATIN CALCIUM 10 MG PO TABS
10.0000 mg | ORAL_TABLET | Freq: Every day | ORAL | Status: DC
  Filled 2015-02-05: qty 1

## 2015-02-05 MED ORDER — LORAZEPAM 1 MG PO TABS: 1.0000 mg | ORAL_TABLET | Freq: Three times a day (TID) | ORAL | Status: DC | PRN

## 2015-02-05 MED ORDER — NICOTINE 21 MG/24HR TD PT24: 21.0000 mg | MEDICATED_PATCH | Freq: Every day | TRANSDERMAL | Status: DC

## 2015-02-05 MED ORDER — HYDROCHLOROTHIAZIDE 25 MG PO TABS
25.0000 mg | ORAL_TABLET | Freq: Every day | ORAL | Status: DC
  Administered 2015-02-05 – 2015-02-14 (×10): 25 mg via ORAL
  Filled 2015-02-05 (×14): qty 1

## 2015-02-05 MED ORDER — ALUM & MAG HYDROXIDE-SIMETH 200-200-20 MG/5ML PO SUSP: 30.0000 mL | ORAL | Status: DC | PRN

## 2015-02-05 MED ORDER — LORAZEPAM 1 MG PO TABS: 0.0000 mg | ORAL_TABLET | Freq: Four times a day (QID) | ORAL | Status: DC

## 2015-02-05 MED ORDER — LORAZEPAM 1 MG PO TABS: 0.0000 mg | ORAL_TABLET | Freq: Two times a day (BID) | ORAL | Status: DC

## 2015-02-05 MED ORDER — VALSARTAN-HYDROCHLOROTHIAZIDE 320-25 MG PO TABS: 1.0000 | ORAL_TABLET | Freq: Every day | ORAL | Status: DC

## 2015-02-05 MED ORDER — IRBESARTAN 300 MG PO TABS
300.0000 mg | ORAL_TABLET | Freq: Every day | ORAL | Status: DC
  Administered 2015-02-05: 300 mg via ORAL
  Filled 2015-02-05: qty 1

## 2015-02-05 MED ORDER — IRBESARTAN 300 MG PO TABS
300.0000 mg | ORAL_TABLET | Freq: Every day | ORAL | Status: DC
  Administered 2015-02-05 – 2015-02-14 (×10): 300 mg via ORAL
  Filled 2015-02-05 (×14): qty 1

## 2015-02-05 MED ORDER — ZOLPIDEM TARTRATE 5 MG PO TABS: 5.0000 mg | ORAL_TABLET | Freq: Every evening | ORAL | Status: DC | PRN

## 2015-02-05 MED ORDER — CARVEDILOL 12.5 MG PO TABS
12.5000 mg | ORAL_TABLET | Freq: Two times a day (BID) | ORAL | Status: DC
  Filled 2015-02-05 (×2): qty 1

## 2015-02-05 MED ORDER — ATORVASTATIN CALCIUM 10 MG PO TABS
10.0000 mg | ORAL_TABLET | Freq: Every day | ORAL | Status: DC
  Administered 2015-02-05 – 2015-02-14 (×10): 10 mg via ORAL
  Filled 2015-02-05: qty 1
  Filled 2015-02-05: qty 7
  Filled 2015-02-05 (×6): qty 1
  Filled 2015-02-05: qty 7
  Filled 2015-02-05 (×2): qty 1
  Filled 2015-02-05: qty 7
  Filled 2015-02-05 (×3): qty 1

## 2015-02-05 MED ORDER — METFORMIN HCL ER 500 MG PO TB24
500.0000 mg | ORAL_TABLET | Freq: Every day | ORAL | Status: DC
  Administered 2015-02-05: 500 mg via ORAL
  Filled 2015-02-05 (×2): qty 1

## 2015-02-05 MED ORDER — ONDANSETRON HCL 4 MG PO TABS: 4.0000 mg | ORAL_TABLET | Freq: Three times a day (TID) | ORAL | Status: DC | PRN

## 2015-02-05 MED ORDER — ACETAMINOPHEN 325 MG PO TABS: 650.0000 mg | ORAL_TABLET | ORAL | Status: DC | PRN

## 2015-02-05 MED ORDER — METFORMIN HCL ER 500 MG PO TB24
500.0000 mg | ORAL_TABLET | Freq: Every day | ORAL | Status: DC
  Administered 2015-02-06 – 2015-02-14 (×9): 500 mg via ORAL
  Filled 2015-02-05 (×2): qty 1
  Filled 2015-02-05: qty 7
  Filled 2015-02-05 (×3): qty 1
  Filled 2015-02-05: qty 7
  Filled 2015-02-05 (×3): qty 1
  Filled 2015-02-05: qty 7
  Filled 2015-02-05 (×2): qty 1

## 2015-02-05 MED ORDER — HYDRALAZINE HCL 10 MG PO TABS: 10.0000 mg | ORAL_TABLET | Freq: Three times a day (TID) | ORAL | Status: DC

## 2015-02-05 MED ORDER — CARVEDILOL 12.5 MG PO TABS
12.5000 mg | ORAL_TABLET | Freq: Two times a day (BID) | ORAL | Status: DC
  Administered 2015-02-05: 12.5 mg via ORAL
  Filled 2015-02-05 (×3): qty 1

## 2015-02-05 MED ORDER — IBUPROFEN 400 MG PO TABS: 600.0000 mg | ORAL_TABLET | Freq: Three times a day (TID) | ORAL | Status: DC | PRN

## 2015-02-05 MED ORDER — HYDROCHLOROTHIAZIDE 25 MG PO TABS
25.0000 mg | ORAL_TABLET | Freq: Every day | ORAL | Status: DC
  Administered 2015-02-05: 25 mg via ORAL
  Filled 2015-02-05: qty 1

## 2015-02-05 MED ORDER — ACETAMINOPHEN 325 MG PO TABS
650.0000 mg | ORAL_TABLET | Freq: Four times a day (QID) | ORAL | Status: DC | PRN
  Administered 2015-02-11: 650 mg via ORAL
  Filled 2015-02-05: qty 2

## 2015-02-05 MED ORDER — HYDRALAZINE HCL 25 MG PO TABS
25.0000 mg | ORAL_TABLET | Freq: Three times a day (TID) | ORAL | Status: DC
  Administered 2015-02-05: 25 mg via ORAL
  Filled 2015-02-05 (×3): qty 1

## 2015-02-05 MED ORDER — MAGNESIUM HYDROXIDE 400 MG/5ML PO SUSP: 30.0000 mL | Freq: Every day | ORAL | Status: DC | PRN

## 2015-02-05 NOTE — Progress Notes (Signed)
[facility]. Rosey BathKelly Schon Zeiders, RN
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
## 2015-02-05 NOTE — Tx Team (Signed)
Initial Interdisciplinary Treatment Plan   PATIENT STRESSORS: Financial difficulties Health problems Legal issue Loss of relationship - b/u with gf Medication change or noncompliance Occupational concerns Substance abuse   PATIENT STRENGTHS: Average or above average intelligence Communication skills General fund of knowledge Motivation for treatment/growth   PROBLEM LIST: Problem List/Patient Goals Date to be addressed Date deferred Reason deferred Estimated date of resolution  "Ma'am, I don't even fucking know." 02/05/15                                                      DISCHARGE CRITERIA:  Improved stabilization in mood, thinking, and/or behavior Need for constant or close observation no longer present Reduction of life-threatening or endangering symptoms to within safe limits Verbal commitment to aftercare and medication compliance  PRELIMINARY DISCHARGE PLAN: Attend 12-step recovery group Outpatient therapy  PATIENT/FAMIILY INVOLVEMENT: This treatment plan has been presented to and reviewed with the patient, Jeffrey Marquez, and/or family member.  The patient and family have been given the opportunity to ask questions and make suggestions.  Merian CapronFriedman, Emmarie Sannes [facility] 02/05/2015, 6:07 PM

## 2015-02-05 NOTE — ED Notes (Addendum)
[employer] called for IVC transport and car is in route.  [facility].

## 2015-02-05 NOTE — ED Notes (Signed)
TTS machine placed in room, Ala DachFord called to say they will begin soon

## 2015-02-05 NOTE — ED Notes (Signed)
Requested meds from pharmacy  

## 2015-02-05 NOTE — ED Provider Notes (Signed)
Patient is reassessed for IVC and transfer.  Patient is alert and interactive. He has no signs of confusion. He reports still feeling depressed. He does not recall which medications he overdosed on yesterday. He denies daily alcohol use and reports binge drinking.  Mental status is clear. Heart is regular. Lungs are clear to auscultation and there is no respiratory distress. Abdomen is soft. Patient follows all commands but difficulty. Movements are coordinated and symmetric. No tremor. Skin is warm and dry.  IVC examination is completed. Patient has continued depression with suicidal attempt yesterday by overdose. He has concomitant risk factor of substance abuse and homelessness.  Today patient shows no signs of decompensation from overdose or substance withdrawal. He is cleared medically for ongoing psychiatric management and transfer.  Arby BarretteMarcy Caidance Sybert, MD 02/05/15 1146
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
## 2015-02-05 NOTE — ED Notes (Signed)
[facility] pending UDS

## 2015-02-05 NOTE — ED Notes (Signed)
Informed Trixie Dredgemily West PA that the first evaluation needed to be completed.

## 2015-02-05 NOTE — Progress Notes (Signed)
[facility] by Dr. Jama Flavorsobos pending: UDS results Completion of IVC first opinion  Number to call report will be [phone].  Ilean SkillMeghan Keiston Manley, MSW, LCSW Clinical Social Work, Disposition  02/05/2015 [phone]

## 2015-02-05 NOTE — ED Notes (Signed)
Attempted report to BH 

## 2015-02-05 NOTE — Progress Notes (Signed)
IVC initial exam completed by MD and placed in Patient's chart. [facility]. Awaiting UDS results.   Noe GensAshley Gardner, Theresia MajorsLCSWA [facility] ED Social Worker [phone]

## 2015-02-05 NOTE — ED Notes (Signed)
Patient was given a snack and drink, A regular diet order taken for lunch. 

## 2015-02-05 NOTE — Progress Notes (Signed)
Adult Psychoeducational Group Note  Date:  02/05/2015 Time:  8:20 PM  Group Topic/Focus:  Wrap-Up Group:   The focus of this group is to help patients review their daily goal of treatment and discuss progress on daily workbooks.  Participation Level:  Did Not Attend  Pt was asleep during wrap-up group.   Jeffrey NeerJasmine Marquez Jeffrey Marquez 02/05/2015, 9:17 PM

## 2015-02-06 ENCOUNTER — Encounter (HOSPITAL_COMMUNITY): Payer: Self-pay | Admitting: Psychiatry

## 2015-02-06 DIAGNOSIS — R45851 Suicidal ideations: Secondary | ICD-10-CM

## 2015-02-06 LAB — GLUCOSE, CAPILLARY: GLUCOSE-CAPILLARY: 88 mg/dL (ref 65–99)

## 2015-02-06 MED ORDER — INSULIN ASPART 100 UNIT/ML ~~LOC~~ SOLN
0.0000 [IU] | Freq: Three times a day (TID) | SUBCUTANEOUS | Status: DC
  Administered 2015-02-06: 3 [IU] via SUBCUTANEOUS
  Administered 2015-02-06 – 2015-02-13 (×3): 2 [IU] via SUBCUTANEOUS

## 2015-02-06 MED ORDER — BUPROPION HCL ER (XL) 150 MG PO TB24
150.0000 mg | ORAL_TABLET | Freq: Every day | ORAL | Status: DC
  Administered 2015-02-06 – 2015-02-08 (×3): 150 mg via ORAL
  Filled 2015-02-06 (×6): qty 1

## 2015-02-06 MED ORDER — ENSURE ENLIVE PO LIQD
237.0000 mL | Freq: Two times a day (BID) | ORAL | Status: DC
  Administered 2015-02-07: 237 mL via ORAL

## 2015-02-06 MED ORDER — INSULIN ASPART 100 UNIT/ML ~~LOC~~ SOLN
4.0000 [IU] | Freq: Three times a day (TID) | SUBCUTANEOUS | Status: DC
  Administered 2015-02-06 – 2015-02-14 (×25): 4 [IU] via SUBCUTANEOUS

## 2015-02-06 NOTE — Progress Notes (Signed)
Adult Psychoeducational Group Note  Date:  02/06/2015 Time:  10:24 PM  Group Topic/Focus:  Wrap-Up Group:   The focus of this group is to help patients review their daily goal of treatment and discuss progress on daily workbooks.  Participation Level:  Active  Participation Quality:  Appropriate  Affect:  Appropriate  Cognitive:  Alert  Insight: Appropriate  Engagement in Group:  Engaged  Modes of Intervention:  Discussion  Additional Comments:  Patient stated having a rough day. Patient stated something positive that happened today was pet therapy. Patient stated he enjoyed [place].   Nayelli Inglis L Tyjae Issa 02/06/2015, 10:24 PM

## 2015-02-06 NOTE — Tx Team (Signed)
Interdisciplinary Treatment Plan Update (Adult) Date: 02/06/2015   Date: 02/06/2015 12:53 PM  Progress in Treatment:  Attending groups: Pt is new to milieu, continuing to assess  Participating in groups: Pt is new to milieu, continuing to assess  Taking medication as prescribed: Yes  Tolerating medication: Yes  Family/Significant othe contact made: No, CSW attempting to make contact with daughter Patient understands diagnosis: Yes Discussing patient identified problems/goals with staff: Yes  Medical problems stabilized or resolved: Yes  Denies suicidal/homicidal ideation: No, Pt endorses SI and desire to die Patient has not harmed self or Others: Yes   New problem(s) identified: None identified at this time.    Discharge Plan or Barriers: CSW will assess for appropriate discharge plan and relevant barriers.   Additional comments:  Patient and CSW reviewed pt's identified goals and treatment plan. Patient verbalized understanding and agreed to treatment plan. CSW reviewed [facility] "Discharge Process and Patient Involvement" Form. Pt verbalized understanding of information provided and signed form.   Reason for Continuation of Hospitalization:  Anxiety Depression Medication stabilization Suicidal ideation Withdrawal symptoms  Estimated length of stay: 3-5 days  Review of initial/current patient goals per problem list:   1.  Goal(s): Patient will participate in aftercare plan  Met:  No  Target date: 3-5 days from date of admission   As evidenced by: Patient will participate within aftercare plan AEB aftercare provider and housing plan at discharge being identified.  02/06/15: CSW to work with Pt to assess for appropriate discharge plan and faciliate appointments and referrals as needed prior to d/c.  2.  Goal (s): Patient will exhibit decreased depressive symptoms and suicidal ideations.  Met:  No  Target date: 3-5 days from date of admission   As evidenced by: Patient will utilize  self rating of depression at 3 or below and demonstrate decreased signs of depression or be deemed stable for discharge by MD. 02/06/15: Pt was admitted with symptoms of depression, rating 10/10. Pt continues to present with flat affect and depressive symptoms.  Pt will demonstrate decreased symptoms of depression and rate depression at 3/10 or lower prior to discharge.  3.  Goal(s): Patient will demonstrate decreased signs and symptoms of anxiety.  Met:  No  Target date: 3-5 days from date of admission   As evidenced by: Patient will utilize self rating of anxiety at 3 or below and demonstrated decreased signs of anxiety, or be deemed stable for discharge by MD 02/06/15: Pt was admitted with increased levels of anxiety and is currently rating those symptoms highly. Pt will demonstrated decreased symptoms of anxiety and rate it at 3/10 prior to d/c.  Attendees:  Patient:    Family:    Physician: Dr. Parke Poisson, MD  02/06/2015 12:53 PM  Nursing: Lars Pinks, RN Case manager  02/06/2015 12:53 PM  Clinical Social Worker Peri Maris, [place] 02/06/2015 12:53 PM  Other: Tilden Fossa, [place] 02/06/2015 12:53 PM  Clinical:  Ashby Dawes, RN 02/06/2015 12:53 PM  Other: , RN Charge Nurse 02/06/2015 12:53 PM  Other: Hilda Lias, [place], [place] Social Work [phone]

## 2015-02-06 NOTE — [facility] Group Notes (Signed)
[facility] LCSW Group Therapy 02/06/2015 1:15 PM  Type of Therapy: Group Therapy- Feelings about Diagnosis  Participation Level: Minimal  Participation Quality:  N/A- did not participate  Affect:  Flat  Cognitive: Alert and Oriented   Insight:  Developing   Engagement in Therapy: Developing/Improving   Modes of Intervention: Clarification, Confrontation, Discussion, Education, Exploration, Limit-setting, Orientation, Problem-solving, Rapport Building, Dance movement psychotherapisteality Testing, Socialization and Support  Description of Group:   This group will allow patients to explore their thoughts and feelings about diagnoses they have received. Patients will be guided to explore their level of understanding and acceptance of these diagnoses. Facilitator will encourage patients to process their thoughts and feelings about the reactions of others to their diagnosis, and will guide patients in identifying ways to discuss their diagnosis with significant others in their lives. This group will be process-oriented, with patients participating in exploration of their own experiences as well as giving and receiving support and challenge from other group members.  Summary of Progress/Problems:  Pt did not participate in discussion and was observed to be withdrawn and disengaged.  Therapeutic Modalities:   Cognitive Behavioral Therapy Solution Focused Therapy Motivational Interviewing Relapse Prevention Therapy  Chad CordialLauren Carter, LCSWA 02/06/2015 3:15 PM
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
## 2015-02-06 NOTE — Progress Notes (Signed)
Recreation Therapy Notes  Animal-Assisted Activity (AAA) Program Checklist/Progress Notes Patient Eligibility Criteria Checklist & Daily Group note for Rec Tx Intervention  Date: 01.03.2017 Time: 2:45pm [address]    AAA/T Program Assumption of Risk Form signed by Patient/ or Parent Legal Guardian yes  Patient is free of allergies or sever asthma yes  Patient reports no fear of animals yes  Patient reports no history of cruelty to animals yes  Patient understands his/her participation is voluntary yes  Patient washes hands before animal contact yes  Patient washes hands after animal contact yes  Behavioral Response: Appropriate   Education: Hand Washing, Appropriate Animal Interaction   Education Outcome: Acknowledges education.   Clinical Observations/Feedback: Patient engaged appropriately with therapy dog and peers during session.   Marykay Lexenise L Oletta Buehring, LRT/CTRS        Samora Jernberg L 02/06/2015 2:06 PM

## 2015-02-06 NOTE — Progress Notes (Signed)
Inpatient Diabetes Program Recommendations  AACE/ADA: New Consensus Statement on Inpatient Glycemic Control (2015)  Target Ranges:  Prepandial:   less than 140 mg/dL      Peak postprandial:   less than 180 mg/dL (1-2 hours)      Critically ill patients:  140 - 180 mg/dL   Review of Glycemic Control  Diabetes history: DM2 Outpatient Diabetes medications: metformin 500 QD (Pt not taking) Current orders for Inpatient glycemic control: Novolog moderate tidwc + 4 units tidwc, metformin 500 bid,   Inpatient Diabetes Program Recommendations:    Blood sugars appear to be WNL. HgbA1C - 6.4% - 05/18/2014 - Needs updated HgbA1C  Will follow-up in am. Thank you. Ailene Ardshonda Levita Monical, RD, LDN, CDE Inpatient Diabetes Coordinator [phone]

## 2015-02-06 NOTE — Progress Notes (Signed)
Patient ID: Jeffrey CorollaGary Debell, male   DOB: 29-Jan-1961, 55 y.o.   MRN: [id number] D   ---  CBG values were verbal from MHTs today.  Glucometer would not down load in to EPIC.

## 2015-02-06 NOTE — Progress Notes (Signed)
Patient ID: Jeffrey CorollaGary Maita, male   DOB: 1960-06-09, 55 y.o.   MRN: [id number] D   ---  Pt. Denies pain and agrees to contract at this time.   He maintains a sad , flat , depressed affect but is app/coop with staff.    Pt. Did not attend AM group but did attend pet therapy this afternoon.   Pt. Was started on Insulin and Wellbutrin today whcih he has taken with no signs of adverse effects.   Pt. Asked appropriate questions about Wellbutrin and was provided  Education by Clinical research associatewriter  Pt. Has shown no negative behaviors and interacts well with peers.   Pt. Is concerned [facility]  ---  A  ---  Support, mediations and encouragement provided.   --- R -- pt. Remains safe but sad on unit

## 2015-02-06 NOTE — Progress Notes (Signed)
NUTRITION ASSESSMENT  Pt identified as at risk on the Malnutrition Screen Tool  INTERVENTION: 1. Supplements: Ensure Enlive po BID, each supplement provides 350 kcal and 20 grams of protein  NUTRITION DIAGNOSIS: Unintentional weight loss related to sub-optimal intake as evidenced by pt report.   Goal: Pt to meet >/= 90% of their estimated nutrition needs.  Monitor:  PO intake  Assessment:  Pt admitted with depression and SI. Pt with ETOH and drug use PTA. Pt with reported decreased appetite resulting in 43 lb weight loss since 10/18/14 (13% weight loss x 4 months, significant for time frame). Patient would benefit from nutritional supplementation, RD to order.  Height: Ht Readings from Last 1 Encounters:  02/05/15 5' 9.5" (1.765 m)    Weight: Wt Readings from Last 1 Encounters:  02/05/15 281 lb (127.461 kg)    Weight Hx: Wt Readings from Last 10 Encounters:  02/05/15 281 lb (127.461 kg)  02/04/15 281 lb 7 oz (127.659 kg)  12/01/14 317 lb 12.8 oz (144.153 kg)  10/18/14 324 lb (146.965 kg)  10/18/14 324 lb 6.4 oz (147.147 kg)  05/18/14 318 lb (144.244 kg)  04/26/14 311 lb (141.069 kg)  02/10/14 303 lb 12.8 oz (137.803 kg)  01/13/14 299 lb 6.4 oz (135.807 kg)  01/10/14 303 lb (137.44 kg)    BMI:  Body mass index is 40.92 kg/(m^2). Pt meets criteria for morbid obesity based on current BMI.  Estimated Nutritional Needs: Kcal: 25-30 kcal/kg Protein: > 1 gram protein/kg Fluid: 1 ml/kcal  Diet Order: Diet Carb Modified Fluid consistency:: Thin; Room service appropriate?: Yes Pt is also offered choice of unit snacks mid-morning and mid-afternoon.  Pt is eating as desired.   Lab results and medications reviewed.   Tilda FrancoLindsey Nazaret Chea, MS, RD, LDN Pager: [phone] After Hours Pager: [phone]
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
## 2015-02-06 NOTE — [facility] Group Notes (Signed)
[facility] Group Notes:  (Nursing/MHT/Case Management/Adjunct)  Date:  02/06/2015  Time:  10:41 AM  Type of Therapy:  Psychoeducational Skills  Participation Level:  Did Not Attend  Participation Quality:  N/A  Affect:  N/A  Cognitive:  N/a  Insight:  None  Engagement in Group:  N/A  Modes of Intervention:  N/A  Summary of Progress/Problems:   Pt. Did not attend  Jeffrey Marquez, Jeffrey Marquez 02/06/2015, 10:41 AM

## 2015-02-07 LAB — GLUCOSE, CAPILLARY
GLUCOSE-CAPILLARY: 145 mg/dL — AB (ref 65–99)
GLUCOSE-CAPILLARY: 96 mg/dL (ref 65–99)
GLUCOSE-CAPILLARY: 107 mg/dL — AB (ref 65–99)
GLUCOSE-CAPILLARY: 147 mg/dL — AB (ref 65–99)
Glucose-Capillary: 153 mg/dL — ABNORMAL HIGH (ref 65–99)
Glucose-Capillary: 103 mg/dL — ABNORMAL HIGH (ref 65–99)

## 2015-02-07 NOTE — [facility] Suicide Risk Assessment (Signed)
[facility] INPATIENT:  Family/Significant Other Suicide Prevention Education  Suicide Prevention Education:  Education Completed; Lane Hackeramika Murray, Pt's daughter [phone],  has been identified by the patient as the family member/significant other with whom the patient will be residing, and identified as the person(s) who will aid the patient in the event of a mental health crisis (suicidal ideations/suicide attempt).  With written consent from the patient, the family member/significant other has been provided the following suicide prevention education, prior to the and/or following the discharge of the patient.  The suicide prevention education provided includes the following:  Suicide risk factors  Suicide prevention and interventions  National Suicide Hotline telephone number  [facility] assessment telephone number  [facility] Emergency Assistance 911  [facility] and/or Residential Mobile Crisis Unit telephone number  Request made of family/significant other to:  Remove weapons (e.g., guns, rifles, knives), all items previously/currently identified as safety concern.    Remove drugs/medications (over-the-counter, prescriptions, illicit drugs), all items previously/currently identified as a safety concern.  The family member/significant other verbalizes understanding of the suicide prevention education information provided.  The family member/significant other agrees to remove the items of safety concern listed above.  Elaina Hoopsarter, Meryle Pugmire M 02/07/2015, 8:33 AM

## 2015-02-07 NOTE — Progress Notes (Signed)
Recreation Therapy Notes  Date: 01.04.2017 Time: 9:30am Location: [address] Group Room   Group Topic: Stress Management  Goal Area(s) Addresses:  Patient will actively participate in stress management techniques presented during session.   Behavioral Response: Did not attend.   Umaima Scholten L Pranav Lince, LRT/CTRS        Amri Lien L 02/07/2015 12:49 PM 

## 2015-02-07 NOTE — Progress Notes (Signed)
Adult Psychoeducational Group Note  Date:  02/07/2015 Time:  10:28 PM  Group Topic/Focus:  Wrap-Up Group:   The focus of this group is to help patients review their daily goal of treatment and discuss progress on daily workbooks.  Participation Level:  Active  Participation Quality:  Appropriate  Affect:  Appropriate  Cognitive:  Alert  Insight: Appropriate  Engagement in Group:  Engaged  Modes of Intervention:  Discussion  Additional Comments:  Patient stated his goal for today was to attend group and to stay out of his room. Patient stated he met his goal. Patient rated his day as a 5.  Breelle Hollywood L Gatsby Chismar 02/07/2015, 10:28 PM

## 2015-02-07 NOTE — Progress Notes (Signed)
Patient ID: Jeffrey CorollaGary Marquez, male   DOB: 04-04-1960, 55 y.o.   MRN: [id number] D: Client seen in dayroom watching TV this shift, reports of his day "okay so far" "need to talk to psychiatrist more" "been sitting out more today" rates depression "8" of 10. Client reports passive SI, "seem like everything I touch turns to sh_t." "I'm done" A: Writer provided emotional support encouraged client to think of positive things, consider what would make him feel like life is worth living etc. Family, faith, or other things. Staff will monitor q6215min for safety. R: Client is safe on the unit, shares a little about family and thanks Clinical research associatewriter for just taking the time to talk with him.

## 2015-02-07 NOTE — [facility] Group Notes (Signed)
[facility] LCSW Group Therapy 02/07/2015 1:15 PM  Type of Therapy: Group Therapy- Emotion Regulation  Participation Level: Minimal  Participation Quality:  N/A- did not participate  Affect: Flat  Cognitive: Alert and Oriented   Insight:  Developing/Improving  Engagement in Therapy: Limited  Modes of Intervention: Clarification, Confrontation, Discussion, Education, Exploration, Limit-setting, Orientation, Problem-solving, Rapport Building, Dance movement psychotherapisteality Testing, Socialization and Support  Summary of Progress/Problems: The topic for group today was emotional regulation. This group focused on both positive and negative emotion identification and allowed group members to process ways to identify feelings, regulate negative emotions, and find healthy ways to manage internal/external emotions. Group members were asked to reflect on a time when their reaction to an emotion led to a negative outcome and explored how alternative responses using emotion regulation would have benefited them. Group members were also asked to discuss a time when emotion regulation was utilized when a negative emotion was experienced. Pt did to participate in group discussion. He is observed to have flat affect and be withdrawn.   Jeffrey CordialLauren Carter, LCSWA 02/07/2015 3:35 PM

## 2015-02-07 NOTE — [facility] Group Notes (Signed)
[facility]  02/07/2015 8:45 AM  Pt did not attend, declined invitation.   Chad CordialLauren Carter, LCSWA 02/07/2015 9:56 AM

## 2015-02-07 NOTE — Plan of Care (Signed)
Problem: Ineffective individual coping Goal: STG: Patient will remain free from self harm Outcome: Progressing Patient has not engaged in self harm. Endorsing SI but denies intent, plan and verbally contracts for safety.  Problem: Diagnosis: Increased Risk For Suicide Attempt Goal: STG-Patient Will Comply With Medication Regime Outcome: Progressing Patient has been med compliant.

## 2015-02-07 NOTE — Progress Notes (Signed)
Met with patient 1:1. Remains flat, depressed with some anxiety. Rating depression at an 8/10, hopelessness at a 9/10. Denies pain however reports light tingling on the L side of his neck. Area assessed, no lesions, redness noted at this time. Encouraged to speak to MD regarding concern. Discussed patient's status with tx team. Medicated per orders without difficulty. Endorsing passive SI but verbally contracts for safety. "Yes, I think about it from time to time but I won't act on it. I will come talk to you." Denies HI. Patient remains safe on level III obs. Jeffrey Marquez

## 2015-02-07 NOTE — Progress Notes (Addendum)
[facility] MD Progress Note  02/07/2015 2:40 PM Jeffrey Marquez  MRN:  [id number] Subjective:  Patient remains depressed. Denies medication side effects. Objective : I have discussed case with treatment team and have met with patient. Continues to present depressed, with subdued affect, although today presenting with improved eye contact and is more communicative . Less severely ruminative about his stressors, mainly recent disruption /termination of relationship with SO. Has tended to ruminate about sexual dysfunction as a major stressor which he feels has contributed to his depression and to relationship failure. Today less focused on this , but ongoing ruminations about overall declining  Physical health. Seems more responsive to support, encouragement, empathy today. Visible in day room, no disruptive behaviors on unit, limited interactions with peers. Denies medication side effects at this time- currently on Wellbutrin XL trial.  Principal Problem: MDD (major depressive disorder), recurrent severe, without psychosis ([place]) Diagnosis:   Patient Active Problem List   Diagnosis Date Noted  . MDD (major depressive disorder), recurrent severe, without psychosis ([place]) [F33.2] 02/05/2015  . Unstable angina ([place]) [I20.0] 10/18/2014  . Essential hypertension, malignant [I10] 01/10/2014  . DM II (diabetes mellitus, type II), controlled ([place]) [E11.9] 11/27/2013   Total Time spent with patient: 25 minutes    Past Medical History:  Past Medical History  Diagnosis Date  . Diverticulitis   . Hypertension   . Diverticula of colon 2012  . Diabetes mellitus without complication ([place])   . Obesity   . Drug abuse   . Alcohol abuse     Past Surgical History  Procedure Laterality Date  . Cardiac catheterization N/A 10/18/2014    Procedure: Left Heart Cath and Coronary Angiography;  Surgeon: Sherren Mocha, MD;  Location: [place] [facility];  Service: Cardiovascular;  Laterality: N/A;   Family History:   Family History  Problem Relation Age of Onset  . Diabetes Mother   . Diabetes Brother     Social History:  History  Alcohol Use  . Yes     History  Drug Use  . Yes  . Special: Cocaine    Social History   Social History  . Marital Status: Divorced    Spouse Name: N/A  . Number of Children: N/A  . Years of Education: N/A   Social History Main Topics  . Smoking status: Never Smoker   . Smokeless tobacco: Never Used  . Alcohol Use: Yes  . Drug Use: Yes    Special: Cocaine  . Sexual Activity: Not Asked   Other Topics Concern  . None   Social History Narrative   Additional Social History:   Sleep: fair, but  improved   Appetite: fair   Current Medications: Current Facility-Administered Medications  Medication Dose Route Frequency Provider Last Rate Last Dose  . acetaminophen (TYLENOL) tablet 650 mg  650 mg Oral Q6H PRN Benjamine Mola, FNP      . alum & mag hydroxide-simeth (MAALOX/MYLANTA) 200-200-20 MG/5ML suspension 30 mL  30 mL Oral Q4H PRN Benjamine Mola, FNP      . atorvastatin (LIPITOR) tablet 10 mg  10 mg Oral Daily Benjamine Mola, FNP   10 mg at 02/07/15 6270  . buPROPion (WELLBUTRIN XL) 24 hr tablet 150 mg  150 mg Oral Daily Encarnacion Slates, NP   150 mg at 02/07/15 0909  . feeding supplement (ENSURE ENLIVE) (ENSURE ENLIVE) liquid 237 mL  237 mL Oral BID BM Clayton Bibles, RD   237 mL at 02/07/15 0911  .  hydrochlorothiazide (HYDRODIURIL) tablet 25 mg  25 mg Oral Daily Jenne Campus, MD   25 mg at 02/07/15 8546   And  . irbesartan (AVAPRO) tablet 300 mg  300 mg Oral Daily Jenne Campus, MD   300 mg at 02/07/15 0909  . insulin aspart (novoLOG) injection 0-15 Units  0-15 Units Subcutaneous TID WC Encarnacion Slates, NP   Stopped at 02/07/15 1209  . insulin aspart (novoLOG) injection 4 Units  4 Units Subcutaneous TID WC Encarnacion Slates, NP   4 Units at 02/07/15 1207  . magnesium hydroxide (MILK OF MAGNESIA) suspension 30 mL  30 mL Oral Daily PRN Benjamine Mola, FNP       . metFORMIN (GLUCOPHAGE-XR) 24 hr tablet 500 mg  500 mg Oral Q breakfast Benjamine Mola, FNP   500 mg at 02/07/15 2703    Lab Results:  Results for orders placed or performed during the hospital encounter of 02/05/15 (from the past 48 hour(s))  Glucose, capillary     Status: Abnormal   Collection Time: 02/06/15  1:27 PM  Result Value Ref Range   Glucose-Capillary 153 (H) 65 - 99 mg/dL   Comment 1 Notify RN    Comment 2 Document in Chart   Glucose, capillary     Status: Abnormal   Collection Time: 02/06/15  4:52 PM  Result Value Ref Range   Glucose-Capillary 145 (H) 65 - 99 mg/dL   Comment 1 Notify RN    Comment 2 Document in Chart   Glucose, capillary     Status: None   Collection Time: 02/06/15  8:45 PM  Result Value Ref Range   Glucose-Capillary 88 65 - 99 mg/dL  Glucose, capillary     Status: None   Collection Time: 02/07/15  6:07 AM  Result Value Ref Range   Glucose-Capillary 96 65 - 99 mg/dL    Physical Findings: AIMS: Facial and Oral Movements Muscles of Facial Expression: None, normal Lips and Perioral Area: None, normal Jaw: None, normal Tongue: None, normal,Extremity Movements Upper (arms, wrists, hands, fingers): None, normal Lower (legs, knees, ankles, toes): None, normal, Trunk Movements Neck, shoulders, hips: None, normal, Overall Severity Severity of abnormal movements (highest score from questions above): None, normal Incapacitation due to abnormal movements: None, normal Patient's awareness of abnormal movements (rate only patient's report): No Awareness, Dental Status Current problems with teeth and/or dentures?: No Does patient usually wear dentures?: No  CIWA:  CIWA-Ar Total: 0 COWS:     Musculoskeletal: Strength & Muscle Tone: within normal limits Gait & Station: normal Patient leans: N/A  Psychiatric Specialty Exam: ROS no chest pain, no shortness of breath, no vomiting   Blood pressure 104/79, pulse 76, temperature 97.4 F (36.3 C),  temperature source Oral, resp. rate 16, height 5' 9.5" (1.765 m), weight 281 lb (127.461 kg), SpO2 96 %.Body mass index is 40.92 kg/(m^2).  General Appearance: Fairly Groomed  Engineer, water::  Fair  Speech:  Normal Rate- more verbal and communicative today  Volume:  Decreased  Mood:  Depressed  Affect:  Constricted  Thought Process:  Linear  Orientation:  Other:  fully alert and attentive   Thought Content:  denies hallucinations, no delusions   Suicidal Thoughts:  at this time denies plan or intention of SI and contracts for safety on unit, but states he still has passive thoughts of death, which he attributes to stressors as above   Homicidal Thoughts:  No  Memory:  recent and remote grossly intact  Judgement:  Other:  improved   Insight:  Fair  Psychomotor Activity:  Normal  Concentration:  Good  Recall:  Good  Fund of Knowledge:Good  Language: Good  Akathisia:  Negative  Handed:  Right  AIMS (if indicated):     Assets:  Communication Skills Desire for Improvement Resilience  ADL's:  Intact  Cognition: WNL  Sleep:  Number of Hours: 6.5  Assessment - patient remains depressed, constricted, but some improvement noted compared to admission. Today denies suicidal plan or intention and is noted to be more visible in milieu, although interactions with others still limited. Tolerating medications well thus far . Treatment Plan Summary: Daily contact with patient to assess and evaluate symptoms and progress in treatment, Medication management, Plan inpatient admission and medications as below  Encourage ongoing milieu, group participation to work on coping skills and symptom reduction Continue Wellbutrin XL 150 mgrs QDAY for depression Continue Glucophage/insulin for DM management Continue HCTZ/ Irbesartan for management of HTN As recommended, will monitor HgbA1c level COBOS, FERNANDO 02/07/2015, 2:40 PM

## 2015-02-08 LAB — GLUCOSE, CAPILLARY
GLUCOSE-CAPILLARY: 103 mg/dL — AB (ref 65–99)
GLUCOSE-CAPILLARY: 90 mg/dL (ref 65–99)
Glucose-Capillary: 93 mg/dL (ref 65–99)
Glucose-Capillary: 99 mg/dL (ref 65–99)

## 2015-02-08 MED ORDER — GLUCERNA SHAKE PO LIQD
237.0000 mL | Freq: Three times a day (TID) | ORAL | Status: DC
  Administered 2015-02-08 – 2015-02-13 (×6): 237 mL via ORAL

## 2015-02-08 MED ORDER — BUPROPION HCL ER (XL) 300 MG PO TB24
300.0000 mg | ORAL_TABLET | Freq: Every day | ORAL | Status: DC
  Administered 2015-02-09 – 2015-02-12 (×4): 300 mg via ORAL
  Filled 2015-02-08 (×6): qty 1

## 2015-02-08 MED ORDER — TRAZODONE HCL 50 MG PO TABS
50.0000 mg | ORAL_TABLET | Freq: Every evening | ORAL | Status: DC | PRN
  Administered 2015-02-08: 50 mg via ORAL
  Filled 2015-02-08: qty 1

## 2015-02-08 NOTE — Progress Notes (Signed)
[facility] in week [facility] law enforcement that patient is to be released to their custody upon discharge. Per Santa GeneraAnne Cunningham, Social Work Director at [facility], [facility]nicareDon Causey, D.R. Horton, IncHead of Security, should make contact directly with law enforcement rather than clinical staff. Message left for Lauren, CM, and order outlining this request located in front of shadow chart. Lawrence MarseillesFriedman, Raymar Joiner Eakes

## 2015-02-08 NOTE — Progress Notes (Addendum)
Patient ID: Jeffrey Marquez, male   DOB: 1960/06/06, 55 y.o.   MRN: [id number] [facility] MD Progress Note  02/08/2015 5:25 PM Lasean Gorniak  MRN:  [id number] Subjective: Reports ongoing depression and ongoing regret and  ruminations about break up with GF. States he does not know " why I did what I did " ( referring to report that he requested sexual favor from GF's daughter) . States " I feel really bad about it ". Does not think GF is going to be willing to rekindle relationship and states " I just have to move on". Denies medication side effects. Denies current plan or intention of hurting self and contracts for safety on unit   Objective : I have discussed case with treatment team and have met with patient. As above, remains depressed and ruminative about recent stressors- somewhat responsive to support, encouragement and efforts to redirect him towards more future oriented ideations. Patient's group / milieu attendance  has improved partially, but remains isolative and with limited participation.  Of note, has not exhibited any overtly irritable , angry , or disruptive behaviors, and no report of inappropriate or flirtatious behaviors on unit.   No medication side effects reported. Does endorse some dizziness at times . Denies chest pain or SOB.  Principal Problem: MDD (major depressive disorder), recurrent severe, without psychosis ([place]) Diagnosis:   Patient Active Problem List   Diagnosis Date Noted  . MDD (major depressive disorder), recurrent severe, without psychosis ([place]) [F33.2] 02/05/2015  . Unstable angina ([place]) [I20.0] 10/18/2014  . Essential hypertension, malignant [I10] 01/10/2014  . DM II (diabetes mellitus, type II), controlled ([place]) [E11.9] 11/27/2013   Total Time spent with patient: 20 minutes    Past Medical History:  Past Medical History  Diagnosis Date  . Diverticulitis   . Hypertension   . Diverticula of colon 2012  . Diabetes mellitus without complication ([place])   .  Obesity   . Drug abuse   . Alcohol abuse     Past Surgical History  Procedure Laterality Date  . Cardiac catheterization N/A 10/18/2014    Procedure: Left Heart Cath and Coronary Angiography;  Surgeon: Sherren Mocha, MD;  Location: [place] [facility];  Service: Cardiovascular;  Laterality: N/A;   Family History:  Family History  Problem Relation Age of Onset  . Diabetes Mother   . Diabetes Brother     Social History:  History  Alcohol Use  . Yes     History  Drug Use  . Yes  . Special: Cocaine    Social History   Social History  . Marital Status: Divorced    Spouse Name: N/A  . Number of Children: N/A  . Years of Education: N/A   Social History Main Topics  . Smoking status: Never Smoker   . Smokeless tobacco: Never Used  . Alcohol Use: Yes  . Drug Use: Yes    Special: Cocaine  . Sexual Activity: Not Asked   Other Topics Concern  . None   Social History Narrative   Additional Social History:   Sleep:  Partially improved   Appetite: fair   Current Medications: Current Facility-Administered Medications  Medication Dose Route Frequency Provider Last Rate Last Dose  . acetaminophen (TYLENOL) tablet 650 mg  650 mg Oral Q6H PRN Benjamine Mola, FNP      . alum & mag hydroxide-simeth (MAALOX/MYLANTA) 200-200-20 MG/5ML suspension 30 mL  30 mL Oral Q4H PRN Benjamine Mola, FNP      .  atorvastatin (LIPITOR) tablet 10 mg  10 mg Oral Daily Benjamine Mola, FNP   10 mg at 02/08/15 0748  . [START ON 02/09/2015] buPROPion (WELLBUTRIN XL) 24 hr tablet 300 mg  300 mg Oral Daily Fernando A Cobos, MD      . feeding supplement (GLUCERNA SHAKE) (GLUCERNA SHAKE) liquid 237 mL  237 mL Oral TID BM Fernando A Cobos, MD   237 mL at 02/08/15 1016  . hydrochlorothiazide (HYDRODIURIL) tablet 25 mg  25 mg Oral Daily Myer Peer Cobos, MD   25 mg at 02/08/15 0747   And  . irbesartan (AVAPRO) tablet 300 mg  300 mg Oral Daily Jenne Campus, MD   300 mg at 02/08/15 0747  . insulin aspart  (novoLOG) injection 0-15 Units  0-15 Units Subcutaneous TID WC Encarnacion Slates, NP   Stopped at 02/07/15 1209  . insulin aspart (novoLOG) injection 4 Units  4 Units Subcutaneous TID WC Encarnacion Slates, NP   4 Units at 02/08/15 1715  . magnesium hydroxide (MILK OF MAGNESIA) suspension 30 mL  30 mL Oral Daily PRN Benjamine Mola, FNP      . metFORMIN (GLUCOPHAGE-XR) 24 hr tablet 500 mg  500 mg Oral Q breakfast Benjamine Mola, FNP   500 mg at 02/08/15 0747  . traZODone (DESYREL) tablet 50 mg  50 mg Oral QHS PRN Jenne Campus, MD        Lab Results:  Results for orders placed or performed during the hospital encounter of 02/05/15 (from the past 48 hour(s))  Glucose, capillary     Status: None   Collection Time: 02/06/15  8:45 PM  Result Value Ref Range   Glucose-Capillary 88 65 - 99 mg/dL  Glucose, capillary     Status: None   Collection Time: 02/07/15  6:07 AM  Result Value Ref Range   Glucose-Capillary 96 65 - 99 mg/dL  Glucose, capillary     Status: Abnormal   Collection Time: 02/07/15 11:40 AM  Result Value Ref Range   Glucose-Capillary 103 (H) 65 - 99 mg/dL   Comment 1 Notify RN    Comment 2 Document in Chart   Glucose, capillary     Status: Abnormal   Collection Time: 02/07/15  4:56 PM  Result Value Ref Range   Glucose-Capillary 107 (H) 65 - 99 mg/dL   Comment 1 Notify RN    Comment 2 Document in Chart   Glucose, capillary     Status: Abnormal   Collection Time: 02/07/15  8:38 PM  Result Value Ref Range   Glucose-Capillary 147 (H) 65 - 99 mg/dL  Glucose, capillary     Status: Abnormal   Collection Time: 02/08/15  6:21 AM  Result Value Ref Range   Glucose-Capillary 103 (H) 65 - 99 mg/dL    Physical Findings: AIMS: Facial and Oral Movements Muscles of Facial Expression: None, normal Lips and Perioral Area: None, normal Jaw: None, normal Tongue: None, normal,Extremity Movements Upper (arms, wrists, hands, fingers): None, normal Lower (legs, knees, ankles, toes): None,  normal, Trunk Movements Neck, shoulders, hips: None, normal, Overall Severity Severity of abnormal movements (highest score from questions above): None, normal Incapacitation due to abnormal movements: None, normal Patient's awareness of abnormal movements (rate only patient's report): No Awareness, Dental Status Current problems with teeth and/or dentures?: No Does patient usually wear dentures?: No  CIWA:  CIWA-Ar Total: 0 COWS:     Musculoskeletal: Strength & Muscle Tone: within normal limits Gait & Station:  normal Patient leans: N/A  Psychiatric Specialty Exam: ROS no chest pain, no shortness of breath, no vomiting , reports some dizziness   Blood pressure 138/77, pulse 82, temperature 98 F (36.7 C), temperature source Oral, resp. rate 18, height 5' 9.5" (1.765 m), weight 281 lb (127.461 kg), SpO2 96 %.Body mass index is 40.92 kg/(m^2).  General Appearance: Fairly Groomed  Engineer, water::  Fair  Speech:  Normal Rate- more verbal and communicative today  Volume:  Decreased  Mood:  Depressed  Affect: constricted affect, does smile briefly at times   Thought Process:  Linear  Orientation:  Other:  fully alert and attentive   Thought Content:  denies hallucinations, no delusions  Remains ruminative about recent stressors   Suicidal Thoughts:  at this time denies plan or intention of SI and contracts for safety on unit, but states he still has passive thoughts of death, which he attributes to stressors as above   Homicidal Thoughts:  No  Memory:  recent and remote grossly intact   Judgement:  Other:  improved   Insight:  Fair  Psychomotor Activity:  Normal  Concentration:  Good  Recall:  Good  Fund of Knowledge:Good  Language: Good  Akathisia:  Negative  Handed:  Right  AIMS (if indicated):     Assets:  Communication Skills Desire for Improvement Resilience  ADL's:  Intact  Cognition: WNL  Sleep:  Number of Hours: 6  Assessment - patient remains depressed, sad, ruminative  about recent break up, but not suicidal- able to contract for safety on unit.  Behavior on unit calm, and gradually becoming less isolated .Thus far tolerating Wellbutrin XL well . Reports dizziness this AM, but at this time resolved .  Treatment Plan Summary: Daily contact with patient to assess and evaluate symptoms and progress in treatment, Medication management, Plan inpatient admission and medications as below  Encourage ongoing milieu, group participation to work on coping skills and symptom reduction Increase  Wellbutrin XL to 300 mgrs QAM  For symptoms of depression   Continue Glucophage/insulin for DM management Continue HCTZ/ Irbesartan for management of HTN Continue Trazodone 50 mgrs QHS PRN for insomnia as needed  COBOS, FERNANDO 02/08/2015, 5:25 PM

## 2015-02-08 NOTE — [facility] Group Notes (Signed)
[facility] Group Therapy 02/08/2015 1:15pm  Type of Therapy: Mental [employer] Presentation  Participation Level: Active  Participation Quality: Attentive  Affect: Appropriate  Cognitive: Oriented  Insight: Developing/Improving  Engagement in Therapy: Engaged  Modes of Intervention: Discussion, Education and Socialization  Summary of Progress/Problems: Mental [employer] Association (MHA) Speaker came to talk about his personal journey with substance abuse and addiction. The pt processed ways by which to relate to the speaker. MHA speaker provided handouts and educational information pertaining to groups and services offered by the MHA. Pt was engaged in speaker's presentation and was receptive to resources provided.    Kamen Hanken Carter, LCSWA 02/08/2015 1:31 PM  

## 2015-02-08 NOTE — Plan of Care (Signed)
Problem: Diagnosis: Increased Risk For Suicide Attempt Goal: STG-Patient Will Report Suicidal Feelings to Staff Outcome: Progressing Client able to report passive SI, with no plan, frustrated as he notes "everything I touch turns to sh_t" client contracts for safety.  Client also acknowledges that talking helps, he notes that he has family "but they have their own lives" also reports "I believe in [facility] [place]"  "thanks for talking to me"

## 2015-02-08 NOTE — Plan of Care (Signed)
Problem: Diagnosis: Increased Risk For Suicide Attempt Goal: STG-Patient Will Report Suicidal Feelings to Staff Outcome: Progressing Client reports SI AEB reporting passive SI thoughts "everything I touch turn to sh_t", Client able to contract for safety.

## 2015-02-08 NOTE — Progress Notes (Signed)
Patient remains flat, sad and depressed though on observation, less anxious. Rates depression and hopelessness at a 9/10. Endorsing passive SI but says, "well, some." When asked about a goal patient responded, "I'm not there yet. I feel really blue today." Patient's BP quite elevated this morning and on manual re-check, elevation verified. Patient immediately medicated and on manual re-check, BP improved. MD informed. Patient offered support, re-educated on fall precautions which are in place as patient indicates dizziness. (Pt already a moderate fall risk.) Patient verbalizes understanding. CBG "97" at noon, meal coverage given, sliding scale held. Patient cooperative, isolative to self however visible in dayroom. Verbally contracts for safety. No HI and patient remains safe on level III obs. Lawrence MarseillesFriedman, Tedford Berg Eakes

## 2015-02-08 NOTE — Plan of Care (Signed)
Problem: Diagnosis: Increased Risk For Suicide Attempt Goal: STG-Patient will be able to identify reason for suicidal STG-Patient will be able to identify reason for suicidal ideation  Outcome: Progressing Patient able to describe stressors leading to his increased depression and SI  Problem: Alteration in mood Goal: STG-Patient reports thoughts of self-harm to staff Outcome: Progressing Patient endorsing passive SI but verbally contracts for safety.

## 2015-02-08 NOTE — Progress Notes (Signed)
Patient ID: Jeffrey CorollaGary Marquez, male   DOB: 11-Oct-1960, 55 y.o.   MRN: [id number] D: Client in dayroom watching TV, affect brighter smiles at times. Client reports "I been doing a lot of thinking, I asked for a spiritual consult" "I don't want to kill myself" "I ask God for forgiveness over my past I've done a lot of things." "I'm ready to give my life to God, I just want peace and happiness" "I have some warrants against me for parole violation and I'm ready to face them" "The police brought me in and I'll be going with them when I leave here" "I have family a brother who is a psychiatric but he always diagnosing me, talking about I see a pattern, I'm don't want to hear all that" A: Writer provided emotional support encouraged client to follow through with his conviction and spiritual belief as it bring peace to him. Client encouraged to continue medications and use coping skills. Staff will monitor q6515min for safety. R: Client is safe on the unit, attended group.

## 2015-02-08 NOTE — Progress Notes (Signed)
Adult Psychoeducational Group Note  Date:  02/08/2015 Time:  10:12 PM  Group Topic/Focus:  Wrap-Up Group:   The focus of this group is to help patients review their daily goal of treatment and discuss progress on daily workbooks.  Participation Level:  Active  Participation Quality:  Appropriate  Affect:  Appropriate  Cognitive:  Alert  Insight: Appropriate  Engagement in Group:  Engaged  Modes of Intervention:  Discussion  Additional Comments:  Patient stated having a good day.   Shaune Malacara L Tuvia Woodrick 02/08/2015, 10:12 PM

## 2015-02-09 DIAGNOSIS — F332 Major depressive disorder, recurrent severe without psychotic features: Principal | ICD-10-CM

## 2015-02-09 LAB — GLUCOSE, CAPILLARY
GLUCOSE-CAPILLARY: 152 mg/dL — AB (ref 65–99)
Glucose-Capillary: 102 mg/dL — ABNORMAL HIGH (ref 65–99)

## 2015-02-09 LAB — HEMOGLOBIN A1C
HEMOGLOBIN A1C: 6.5 % — AB (ref 4.8–5.6)
Mean Plasma Glucose: 140 mg/dL

## 2015-02-09 MED ORDER — GUAIFENESIN ER 600 MG PO TB12
600.0000 mg | ORAL_TABLET | Freq: Two times a day (BID) | ORAL | Status: DC
  Administered 2015-02-09 – 2015-02-10 (×3): 600 mg via ORAL
  Filled 2015-02-09 (×8): qty 1

## 2015-02-09 MED ORDER — TRAZODONE HCL 50 MG PO TABS
50.0000 mg | ORAL_TABLET | Freq: Every evening | ORAL | Status: DC | PRN
  Administered 2015-02-09 – 2015-02-13 (×4): 50 mg via ORAL
  Filled 2015-02-09: qty 7
  Filled 2015-02-09 (×5): qty 1

## 2015-02-09 NOTE — Progress Notes (Signed)
D) Pt has complained of a cough today and for the most part has focused on it. Affect is flat and mood depressed. Rates his depression, hopelessness and helplessness all at an 8. Denies SI and HI. A) Given support and reassurance along with praise. Given encouragement. Provided with a brief 1:1.  R) Denies SI and HI.

## 2015-02-09 NOTE — Progress Notes (Signed)
D: Client relaxing in room today, reports "I didn't see the chaplin but that's all right, I feel good, been reading my bible" "I had a great day, no depression" A: Writer provided emotional support, reviewed medications administered as ordered. Staff will monitor q8515min for safety. R: client is safe on the unit, attended group.

## 2015-02-09 NOTE — Tx Team (Signed)
Interdisciplinary Treatment Plan Update (Adult) Date: 02/09/2015   Date: 02/09/2015 1:14 PM  Progress in Treatment:  Attending groups: Yes, at times  Participating in groups: No  Taking medication as prescribed: Yes  Tolerating medication: Yes  Family/Significant othe contact made: Yes with daughter Patient understands diagnosis: Yes Discussing patient identified problems/goals with staff: Yes  Medical problems stabilized or resolved: Yes  Denies suicidal/homicidal ideation: No, Pt endorses SI and desire to die Patient has not harmed self or Others: Yes   New problem(s) identified: None identified at this time.    Discharge Plan or Barriers: CSW will assess for appropriate discharge plan and relevant barriers.   02/09/15: Per judge's order, Pt to be picked up by police at [place]. Has follow-up scheduled at [facility]  Additional comments:  Patient and CSW reviewed pt's identified goals and treatment plan. Patient verbalized understanding and agreed to treatment plan. CSW reviewed [facility] "Discharge Process and Patient Involvement" Form. Pt verbalized understanding of information provided and signed form.   Reason for Continuation of Hospitalization:  Anxiety Depression Medication stabilization Suicidal ideation Withdrawal symptoms  Estimated length of stay: 3-5 days  Review of initial/current patient goals per problem list:   1.  Goal(s): Patient will participate in aftercare plan  Met:  Adequate for DC  Target date: 3-5 days from date of admission   As evidenced by: Patient will participate within aftercare plan AEB aftercare provider and housing plan at discharge being identified.  02/06/15: CSW to work with Pt to assess for appropriate discharge plan and faciliate appointments and referrals as needed prior to d/c. 02/09/15: Per judge's order, Pt to be picked up by police at [place]. Has follow-up scheduled at [facility]  2.  Goal (s): Patient will exhibit decreased depressive symptoms and  suicidal ideations.  Met:  No  Target date: 3-5 days from date of admission   As evidenced by: Patient will utilize self rating of depression at 3 or below and demonstrate decreased signs of depression or be deemed stable for discharge by MD. 02/06/15: Pt was admitted with symptoms of depression, rating 10/10. Pt continues to present with flat affect and depressive symptoms.  Pt will demonstrate decreased symptoms of depression and rate depression at 3/10 or lower prior to discharge. 02/09/15: Pt continues to rate depression highly, however affect and insight improving.  3.  Goal(s): Patient will demonstrate decreased signs and symptoms of anxiety.  Met:  No  Target date: 3-5 days from date of admission   As evidenced by: Patient will utilize self rating of anxiety at 3 or below and demonstrated decreased signs of anxiety, or be deemed stable for discharge by MD 02/06/15: Pt was admitted with increased levels of anxiety and is currently rating those symptoms highly. Pt will demonstrated decreased symptoms of anxiety and rate it at 3/10 prior to d/c. 02/09/15: Pt continues to rate anxiety at high levels  Attendees:  Patient:    Family:    Physician: Dr. Sabra Heck, MD  02/09/2015 1:14 PM  Nursing: Lars Pinks, RN Case manager  02/09/2015 1:14 PM  Clinical Social Worker Peri Maris, [place] 02/09/2015 1:14 PM  Other: ,  02/09/2015 1:14 PM  Clinical:  Bryson Dames, RN 02/09/2015 1:14 PM  Other: , RN Charge Nurse 02/09/2015 1:14 PM  Other: Hilda Lias, [place], [place] Social Work [phone]

## 2015-02-09 NOTE — [facility] Group Notes (Signed)
[facility] LCSW Group Therapy 02/09/2015 1:15pm  Type of Therapy: Group Therapy- Feelings Around Relapse and Recovery  Participation Level: Reserved  Participation Quality:  Attentive  Affect:  Flat  Cognitive: Alert and Oriented   Insight:  Developing   Engagement in Therapy: Developing/Improving and Engaged   Modes of Intervention: Clarification, Confrontation, Discussion, Education, Exploration, Limit-setting, Orientation, Problem-solving, Rapport Building, Dance movement psychotherapisteality Testing, Socialization and Support  Summary of Progress/Problems: The topic for today was feelings about relapse. The group discussed what relapse prevention is to them and identified triggers that they are on the path to relapse. Members also processed their feeling towards relapse and were able to relate to common experiences. Group also discussed coping skills that can be used for relapse prevention.   Pt affect observed to be improving and is more attentive to discussion. Continues to participate minimally.    Therapeutic Modalities:   Cognitive Behavioral Therapy Solution-Focused Therapy Assertiveness Training Relapse Prevention Therapy    Chad CordialLauren Carter, Theresia MajorsLCSWA [phone] 02/09/2015 4:00 PM

## 2015-02-09 NOTE — Progress Notes (Addendum)
Inpatient Diabetes Program Recommendations  AACE/ADA: New Consensus Statement on Inpatient Glycemic Control (2015)  Target Ranges:  Prepandial:   less than 140 mg/dL      Peak postprandial:   less than 180 mg/dL (1-2 hours)      Critically ill patients:  140 - 180 mg/dL    Results for Nicholaus CorollaLINDSAY, Sair (MRN [id number]) as of 02/09/2015 12:45  Ref. Range 02/08/2015 06:21 02/08/2015 11:34 02/08/2015 16:30 02/08/2015 20:39  Glucose-Capillary Latest Ref Range: 65-99 mg/dL 782103 (H) 93 99 90    Current Insulin Orders: Novolog Moderate SSI (0-15 units) TID AC      Novolog 4 units tidwc      MD- Glucose levels well controlled on current insulin regimen.  No recommendations at this time.   --Will follow patient during hospitalization--  Ambrose FinlandJeannine Johnston Caydin Yeatts RN, MSN, CDE Diabetes Coordinator Inpatient Glycemic Control Team Team Pager: [phone] (8a-5p)

## 2015-02-09 NOTE — Progress Notes (Signed)
[facility] MD Progress Note  02/09/2015 6:28 PM Jeffrey Marquez  MRN:  [id number] Subjective:  Patient was mostly worried about a cough he may have contracted from his roommate  He states that he is feeling congested with a scratchy throat.  He feels mildly improved in his mood.  Denies medication side effects. Denies current plan or intention of hurting self and contracts for safety on unit   Objective:  Per nursing he is participating in groups.  He is pleasant.  No medication side effects reported. Does endorse some dizziness at times . Denies chest pain or SOB.  Principal Problem: MDD (major depressive disorder), recurrent severe, without psychosis (HCC) Diagnosis:   Patient Active Problem List   Diagnosis Date Noted  . MDD (major depressive disorder), recurrent severe, without psychosis (HCC) [F33.2] 02/05/2015  . Unstable angina (HCC) [I20.0] 10/18/2014  . Essential hypertension, malignant [I10] 01/10/2014  . DM II (diabetes mellitus, type II), controlled (HCC) [E11.9] 11/27/2013   Total Time spent with patient: 20 minutes    Past Medical History:  Past Medical History  Diagnosis Date  . Diverticulitis   . Hypertension   . Diverticula of colon 2012  . Diabetes mellitus without complication (HCC)   . Obesity   . Drug abuse   . Alcohol abuse     Past Surgical History  Procedure Laterality Date  . Cardiac catheterization N/A 10/18/2014    Procedure: Left Heart Cath and Coronary Angiography;  Surgeon: Tonny Bollman, MD;  Location: [facility] INVASIVE CV LAB;  Service: Cardiovascular;  Laterality: N/A;   Family History:  Family History  Problem Relation Age of Onset  . Diabetes Mother   . Diabetes Brother     Social History:  History  Alcohol Use  . Yes     History  Drug Use  . Yes  . Special: Cocaine    Social History   Social History  . Marital Status: Divorced    Spouse Name: N/A  . Number of Children: N/A  . Years of Education: N/A   Social History Main Topics  .  Smoking status: Never Smoker   . Smokeless tobacco: Never Used  . Alcohol Use: Yes  . Drug Use: Yes    Special: Cocaine  . Sexual Activity: Not Asked   Other Topics Concern  . None   Social History Narrative   Additional Social History:   Sleep:  Partially improved   Appetite: fair   Current Medications: Current Facility-Administered Medications  Medication Dose Route Frequency Provider Last Rate Last Dose  . acetaminophen (TYLENOL) tablet 650 mg  650 mg Oral Q6H PRN Beau Fanny, FNP      . alum & mag hydroxide-simeth (MAALOX/MYLANTA) 200-200-20 MG/5ML suspension 30 mL  30 mL Oral Q4H PRN Beau Fanny, FNP      . atorvastatin (LIPITOR) tablet 10 mg  10 mg Oral Daily Beau Fanny, FNP   10 mg at 02/09/15 0813  . buPROPion (WELLBUTRIN XL) 24 hr tablet 300 mg  300 mg Oral Daily Craige Cotta, MD   300 mg at 02/09/15 0814  . feeding supplement (GLUCERNA SHAKE) (GLUCERNA SHAKE) liquid 237 mL  237 mL Oral TID BM Craige Cotta, MD   237 mL at 02/08/15 2018  . guaiFENesin (MUCINEX) 12 hr tablet 600 mg  600 mg Oral BID Adonis Brook, NP      . hydrochlorothiazide (HYDRODIURIL) tablet 25 mg  25 mg Oral Daily Craige Cotta, MD   25 mg  at 02/09/15 0813   And  . irbesartan (AVAPRO) tablet 300 mg  300 mg Oral Daily Craige CottaFernando A Tauheed Mcfayden, MD   300 mg at 02/09/15 0814  . insulin aspart (novoLOG) injection 0-15 Units  0-15 Units Subcutaneous TID WC Sanjuana KavaAgnes I Nwoko, NP   Stopped at 02/07/15 1209  . insulin aspart (novoLOG) injection 4 Units  4 Units Subcutaneous TID WC Sanjuana KavaAgnes I Nwoko, NP   4 Units at 02/09/15 1736  . magnesium hydroxide (MILK OF MAGNESIA) suspension 30 mL  30 mL Oral Daily PRN Beau FannyJohn C Withrow, FNP      . metFORMIN (GLUCOPHAGE-XR) 24 hr tablet 500 mg  500 mg Oral Q breakfast Beau FannyJohn C Withrow, FNP   500 mg at 02/09/15 0814  . traZODone (DESYREL) tablet 50 mg  50 mg Oral QHS PRN Adonis BrookSheila Agustin, NP        Lab Results:  Results for orders placed or performed during the hospital  encounter of 02/05/15 (from the past 48 hour(s))  Glucose, capillary     Status: Abnormal   Collection Time: 02/07/15  8:38 PM  Result Value Ref Range   Glucose-Capillary 147 (H) 65 - 99 mg/dL  Glucose, capillary     Status: Abnormal   Collection Time: 02/08/15  6:21 AM  Result Value Ref Range   Glucose-Capillary 103 (H) 65 - 99 mg/dL  Hemoglobin [id number]     Status: Abnormal   Collection Time: 02/08/15  6:33 AM  Result Value Ref Range   Hgb A1c MFr Bld 6.5 (H) 4.8 - 5.6 %    Comment: (NOTE)         Pre-diabetes: 5.7 - 6.4         Diabetes: >6.4         Glycemic control for adults with diabetes: <7.0    Mean Plasma Glucose 140 mg/dL    Comment: (NOTE) Performed At: [facility] [address] [place] [id number] Mila HomerHancock William F MD [phone] Performed at [facility]   Glucose, capillary     Status: None   Collection Time: 02/08/15 11:34 AM  Result Value Ref Range   Glucose-Capillary 93 65 - 99 mg/dL   Comment 1 Notify RN    Comment 2 Document in Chart   Glucose, capillary     Status: None   Collection Time: 02/08/15  4:30 PM  Result Value Ref Range   Glucose-Capillary 99 65 - 99 mg/dL   Comment 1 Notify RN    Comment 2 Document in Chart   Glucose, capillary     Status: None   Collection Time: 02/08/15  8:39 PM  Result Value Ref Range   Glucose-Capillary 90 65 - 99 mg/dL  Glucose, capillary     Status: Abnormal   Collection Time: 02/09/15  6:19 AM  Result Value Ref Range   Glucose-Capillary 102 (H) 65 - 99 mg/dL   Comment 1 Notify RN     Physical Findings: AIMS: Facial and Oral Movements Muscles of Facial Expression: None, normal Lips and Perioral Area: None, normal Jaw: None, normal Tongue: None, normal,Extremity Movements Upper (arms, wrists, hands, fingers): None, normal Lower (legs, knees, ankles, toes): None, normal, Trunk Movements Neck, shoulders, hips: None, normal, Overall Severity Severity of abnormal movements  (highest score from questions above): None, normal Incapacitation due to abnormal movements: None, normal Patient's awareness of abnormal movements (rate only patient's report): No Awareness, Dental Status Current problems with teeth and/or dentures?: No Does patient usually wear dentures?: No  CIWA:  CIWA-Ar Total: 0 COWS:     Musculoskeletal: Strength & Muscle Tone: within normal limits Gait & Station: normal Patient leans: N/A  Psychiatric Specialty Exam: ROS no chest pain, no shortness of breath, no vomiting , reports some dizziness   Blood pressure 161/107, pulse 81, temperature 98 F (36.7 C), temperature source Oral, resp. rate 20, height 5' 9.5" (1.765 m), weight 127.461 kg (281 lb), SpO2 96 %.Body mass index is 40.92 kg/(m^2).  General Appearance: Fairly Groomed  Patent attorney::  Fair  Speech:  Normal Rate- more verbal and communicative today  Volume:  Decreased  Mood:  Depressed  Affect: constricted affect, does smile briefly at times   Thought Process:  Linear  Orientation:  Other:  fully alert and attentive   Thought Content:  denies hallucinations, no delusions  Remains ruminative about recent stressors   Suicidal Thoughts:  at this time denies plan or intention of SI and contracts for safety on unit, but states he still has passive thoughts of death, which he attributes to stressors as above   Homicidal Thoughts:  No  Memory:  recent and remote grossly intact   Judgement:  Other:  improved   Insight:  Fair  Psychomotor Activity:  Normal  Concentration:  Good  Recall:  Good  Fund of Knowledge:Good  Language: Good  Akathisia:  Negative  Handed:  Right  AIMS (if indicated):     Assets:  Communication Skills Desire for Improvement Resilience  ADL's:  Intact  Cognition: WNL  Sleep:  Number of Hours: 6.75  Assessment - patient remains depressed, sad, ruminative about recent break up, but not suicidal- able to contract for safety on unit.  Behavior on unit calm, and  gradually becoming less isolated .Thus far tolerating Wellbutrin XL well . Reports dizziness this AM, but at this time resolved .  Treatment Plan Summary: Daily contact with patient to assess and evaluate symptoms and progress in treatment, Medication management, Plan inpatient admission and medications as below  Encourage ongoing milieu, group participation to work on coping skills and symptom reduction Increase  Wellbutrin XL to 300 mgrs QAM  For symptoms of depression   Continue Glucophage/insulin for DM management Continue HCTZ/ Irbesartan for management of HTN Continue Trazodone 50 mgrs QHS PRN for insomnia as needed.  Added order to give patient additional Trazodone 50 mg if first dose is effective. Added Mucinex 600 mg BID for chest congestion. Velna Hatchet May Agustin 02/09/2015, 6:28 PM Agree with  NP Progress Note as above

## 2015-02-09 NOTE — [facility] Group Notes (Signed)
[facility]  02/09/2015 8:45 AM  Pt did not attend, declined invitation.   Chad CordialLauren Carter, LCSWA 02/09/2015 1:12 PM

## 2015-02-09 NOTE — [facility] Group Notes (Signed)
Pt attended wrap up group.  Pt stated his day was pretty good.  He stated his blood pressure was high this morning.  He has no discharge plans yet.  Jeffrey RancherMarjette Frane, MHT

## 2015-02-10 LAB — GLUCOSE, CAPILLARY
GLUCOSE-CAPILLARY: 102 mg/dL — AB (ref 65–99)
GLUCOSE-CAPILLARY: 106 mg/dL — AB (ref 65–99)
GLUCOSE-CAPILLARY: 120 mg/dL — AB (ref 65–99)

## 2015-02-10 MED ORDER — GUAIFENESIN ER 600 MG PO TB12
1200.0000 mg | ORAL_TABLET | Freq: Two times a day (BID) | ORAL | Status: DC
  Administered 2015-02-11 – 2015-02-14 (×7): 1200 mg via ORAL
  Filled 2015-02-10: qty 10
  Filled 2015-02-10: qty 2
  Filled 2015-02-10 (×4): qty 10
  Filled 2015-02-10 (×4): qty 2
  Filled 2015-02-10: qty 10
  Filled 2015-02-10 (×2): qty 2

## 2015-02-10 NOTE — Progress Notes (Signed)
Adult Psychoeducational Group Note  Date:  02/10/2015 Time:  10:49 PM  Group Topic/Focus:  Wrap-Up Group:   The focus of this group is to help patients review their daily goal of treatment and discuss progress on daily workbooks.  Participation Level:  Active  Participation Quality:  Appropriate and Attentive  Affect:  Appropriate  Cognitive:  Appropriate  Insight: Appropriate and Good  Engagement in Group:  Engaged  Modes of Intervention:  Education  Additional Comments:  Pt overall had a good day. Pt goals for tomorrow is to participate in all groups.   Merlinda FrederickKeshia S Jimmie Dattilio 02/10/2015, 10:49 PM

## 2015-02-10 NOTE — Progress Notes (Signed)
[facility] MD Progress Note  02/10/2015 10:19 PM Jeffrey Marquez  MRN:  [id number] Subjective:  Patient stated that his cough improved.  He continues to feel improvement in his mood.  Denies medication side effects. Denies current plan or intention of hurting self and contracts for safety on unit   Objective:  Per nursing he is participating in groups.  He is pleasant.  No medication side effects reported.  Denies chest pain or SOB.  Principal Problem: MDD (major depressive disorder), recurrent severe, without psychosis (HCC) Diagnosis:   Patient Active Problem List   Diagnosis Date Noted  . MDD (major depressive disorder), recurrent severe, without psychosis (HCC) [F33.2] 02/05/2015  . Unstable angina (HCC) [I20.0] 10/18/2014  . Essential hypertension, malignant [I10] 01/10/2014  . DM II (diabetes mellitus, type II), controlled (HCC) [E11.9] 11/27/2013   Total Time spent with patient: 20 minutes    Past Medical History:  Past Medical History  Diagnosis Date  . Diverticulitis   . Hypertension   . Diverticula of colon 2012  . Diabetes mellitus without complication (HCC)   . Obesity   . Drug abuse   . Alcohol abuse     Past Surgical History  Procedure Laterality Date  . Cardiac catheterization N/A 10/18/2014    Procedure: Left Heart Cath and Coronary Angiography;  Surgeon: Tonny Bollman, MD;  Location: [facility] INVASIVE CV LAB;  Service: Cardiovascular;  Laterality: N/A;   Family History:  Family History  Problem Relation Age of Onset  . Diabetes Mother   . Diabetes Brother     Social History:  History  Alcohol Use  . Yes     History  Drug Use  . Yes  . Special: Cocaine    Social History   Social History  . Marital Status: Divorced    Spouse Name: N/A  . Number of Children: N/A  . Years of Education: N/A   Social History Main Topics  . Smoking status: Never Smoker   . Smokeless tobacco: Never Used  . Alcohol Use: Yes  . Drug Use: Yes    Special: Cocaine  . Sexual  Activity: Not Asked   Other Topics Concern  . None   Social History Narrative   Additional Social History:   Sleep:  Partially improved   Appetite: fair   Current Medications: Current Facility-Administered Medications  Medication Dose Route Frequency Provider Last Rate Last Dose  . acetaminophen (TYLENOL) tablet 650 mg  650 mg Oral Q6H PRN Beau Fanny, FNP      . alum & mag hydroxide-simeth (MAALOX/MYLANTA) 200-200-20 MG/5ML suspension 30 mL  30 mL Oral Q4H PRN Beau Fanny, FNP      . atorvastatin (LIPITOR) tablet 10 mg  10 mg Oral Daily Beau Fanny, FNP   10 mg at 02/10/15 4098  . buPROPion (WELLBUTRIN XL) 24 hr tablet 300 mg  300 mg Oral Daily Craige Cotta, MD   300 mg at 02/10/15 0840  . feeding supplement (GLUCERNA SHAKE) (GLUCERNA SHAKE) liquid 237 mL  237 mL Oral TID BM Craige Cotta, MD   237 mL at 02/10/15 2001  . [START ON 02/11/2015] guaiFENesin (MUCINEX) 12 hr tablet 1,200 mg  1,200 mg Oral BID Adonis Brook, NP      . hydrochlorothiazide (HYDRODIURIL) tablet 25 mg  25 mg Oral Daily Craige Cotta, MD   25 mg at 02/10/15 1191   And  . irbesartan (AVAPRO) tablet 300 mg  300 mg Oral Daily Craige Cotta, MD  300 mg at 02/10/15 0653  . insulin aspart (novoLOG) injection 0-15 Units  0-15 Units Subcutaneous TID WC Sanjuana Kava, NP   Stopped at 02/07/15 1209  . insulin aspart (novoLOG) injection 4 Units  4 Units Subcutaneous TID WC Sanjuana Kava, NP   4 Units at 02/10/15 1738  . magnesium hydroxide (MILK OF MAGNESIA) suspension 30 mL  30 mL Oral Daily PRN Beau Fanny, FNP      . metFORMIN (GLUCOPHAGE-XR) 24 hr tablet 500 mg  500 mg Oral Q breakfast Beau Fanny, FNP   500 mg at 02/10/15 0840  . traZODone (DESYREL) tablet 50 mg  50 mg Oral QHS PRN Adonis Brook, NP   50 mg at 02/09/15 2200    Lab Results:  Results for orders placed or performed during the hospital encounter of 02/05/15 (from the past 48 hour(s))  Glucose, capillary     Status: Abnormal    Collection Time: 02/09/15  6:19 AM  Result Value Ref Range   Glucose-Capillary 102 (H) 65 - 99 mg/dL   Comment 1 Notify RN   Glucose, capillary     Status: Abnormal   Collection Time: 02/09/15  8:55 PM  Result Value Ref Range   Glucose-Capillary 152 (H) 65 - 99 mg/dL  Glucose, capillary     Status: Abnormal   Collection Time: 02/10/15  6:28 AM  Result Value Ref Range   Glucose-Capillary 102 (H) 65 - 99 mg/dL  Glucose, capillary     Status: Abnormal   Collection Time: 02/10/15 12:02 PM  Result Value Ref Range   Glucose-Capillary 106 (H) 65 - 99 mg/dL   Comment 1 Notify RN    Comment 2 Document in Chart   Glucose, capillary     Status: Abnormal   Collection Time: 02/10/15  9:05 PM  Result Value Ref Range   Glucose-Capillary 120 (H) 65 - 99 mg/dL   Comment 1 Notify RN    Comment 2 Document in Chart     Physical Findings: AIMS: Facial and Oral Movements Muscles of Facial Expression: None, normal Lips and Perioral Area: None, normal Jaw: None, normal Tongue: None, normal,Extremity Movements Upper (arms, wrists, hands, fingers): None, normal Lower (legs, knees, ankles, toes): None, normal, Trunk Movements Neck, shoulders, hips: None, normal, Overall Severity Severity of abnormal movements (highest score from questions above): None, normal Incapacitation due to abnormal movements: None, normal Patient's awareness of abnormal movements (rate only patient's report): No Awareness, Dental Status Current problems with teeth and/or dentures?: No Does patient usually wear dentures?: No  CIWA:  CIWA-Ar Total: 0 COWS:     Musculoskeletal: Strength & Muscle Tone: within normal limits Gait & Station: normal Patient leans: N/A  Psychiatric Specialty Exam: Review of Systems  Respiratory: Positive for cough.   All other systems reviewed and are negative.  no chest pain, no shortness of breath, no vomiting , reports some dizziness   Blood pressure 151/111, pulse 83, temperature 97.5  F (36.4 C), temperature source Oral, resp. rate 20, height 5' 9.5" (1.765 m), weight 127.461 kg (281 lb), SpO2 96 %.Body mass index is 40.92 kg/(m^2).  General Appearance: Fairly Groomed  Patent attorney::  Fair  Speech:  Normal Rate- more verbal and communicative today  Volume:  Decreased  Mood:  Depressed  Affect: constricted affect, does smile briefly at times   Thought Process:  Linear  Orientation:  Other:  fully alert and attentive   Thought Content:  denies hallucinations, no delusions  Remains ruminative  about recent stressors   Suicidal Thoughts:  at this time denies plan or intention of SI and contracts for safety on unit, but states he still has passive thoughts of death, which he attributes to stressors as above   Homicidal Thoughts:  No  Memory:  recent and remote grossly intact   Judgement:  Other:  improved   Insight:  Fair  Psychomotor Activity:  Normal  Concentration:  Good  Recall:  Good  Fund of Knowledge:Good  Language: Good  Akathisia:  Negative  Handed:  Right  AIMS (if indicated):     Assets:  Communication Skills Desire for Improvement Resilience  ADL's:  Intact  Cognition: WNL  Sleep:  Number of Hours: 6.25  Assessment - patient remains depressed, sad, ruminative about recent break up, but not suicidal- able to contract for safety on unit.  Behavior on unit calm, and gradually becoming less isolated .Thus far tolerating Wellbutrin XL well . Reports dizziness this AM, but at this time resolved .  Treatment Plan Summary: Daily contact with patient to assess and evaluate symptoms and progress in treatment, Medication management, Plan inpatient admission and medications as below  Encourage ongoing milieu, group participation to work on coping skills and symptom reduction Increase  Wellbutrin XL to 300 mgrs QAM  For symptoms of depression   Continue Glucophage/insulin for DM management Continue HCTZ/ Irbesartan for management of HTN Continue Trazodone 50 mgrs  QHS PRN for insomnia as needed.  Added order to give patient additional Trazodone 50 mg if first dose is effective. Increased Mucinex 1200 mg BID for chest congestion.  Velna HatchetSheila May Agustin AGNP-BC 02/10/2015, 10:19 PM  Reviewed the information documented and agree with the treatment plan.  Dereon Corkery,JANARDHAHA R. 02/12/2015 11:23 AM

## 2015-02-10 NOTE — Progress Notes (Signed)
[facility] MD Progress Note  02/10/2015 2:11 PM [place]  MRN:  [id number] Subjective:  Patient stated that his cough improved.  He continues to feel mild improvement in his mood.  Denies medication side effects. Denies current plan or intention of hurting self and contracts for safety on unit   Objective:  Per nursing he is participating in groups.  He is pleasant.  No medication side effects reported. Does endorse some dizziness at times . Denies chest pain or SOB.  Principal Problem: MDD (major depressive disorder), recurrent severe, without psychosis (HCC) Diagnosis:   Patient Active Problem List   Diagnosis Date Noted  . MDD (major depressive disorder), recurrent severe, without psychosis (HCC) [F33.2] 02/05/2015  . Unstable angina (HCC) [I20.0] 10/18/2014  . Essential hypertension, malignant [I10] 01/10/2014  . DM II (diabetes mellitus, type II), controlled (HCC) [E11.9] 11/27/2013   Total Time spent with patient: 20 minutes    Past Medical History:  Past Medical History  Diagnosis Date  . Diverticulitis   . Hypertension   . Diverticula of colon 2012  . Diabetes mellitus without complication (HCC)   . Obesity   . Drug abuse   . Alcohol abuse     Past Surgical History  Procedure Laterality Date  . Cardiac catheterization N/A 10/18/2014    Procedure: Left Heart Cath and Coronary Angiography;  Surgeon: Tonny Bollman, MD;  Location: [facility] INVASIVE CV LAB;  Service: Cardiovascular;  Laterality: N/A;   Family History:  Family History  Problem Relation Age of Onset  . Diabetes Mother   . Diabetes Brother     Social History:  History  Alcohol Use  . Yes     History  Drug Use  . Yes  . Special: Cocaine    Social History   Social History  . Marital Status: Divorced    Spouse Name: N/A  . Number of Children: N/A  . Years of Education: N/A   Social History Main Topics  . Smoking status: Never Smoker   . Smokeless tobacco: Never Used  . Alcohol Use: Yes  . Drug Use:  Yes    Special: Cocaine  . Sexual Activity: Not Asked   Other Topics Concern  . None   Social History Narrative   Additional Social History:   Sleep:  Partially improved   Appetite: fair   Current Medications: Current Facility-Administered Medications  Medication Dose Route Frequency Provider Last Rate Last Dose  . acetaminophen (TYLENOL) tablet 650 mg  650 mg Oral Q6H PRN Beau Fanny, FNP      . alum & mag hydroxide-simeth (MAALOX/MYLANTA) 200-200-20 MG/5ML suspension 30 mL  30 mL Oral Q4H PRN Beau Fanny, FNP      . atorvastatin (LIPITOR) tablet 10 mg  10 mg Oral Daily Beau Fanny, FNP   10 mg at 02/10/15 7829  . buPROPion (WELLBUTRIN XL) 24 hr tablet 300 mg  300 mg Oral Daily Craige Cotta, MD   300 mg at 02/10/15 0840  . feeding supplement (GLUCERNA SHAKE) (GLUCERNA SHAKE) liquid 237 mL  237 mL Oral TID BM Craige Cotta, MD   237 mL at 02/09/15 2038  . guaiFENesin (MUCINEX) 12 hr tablet 600 mg  600 mg Oral BID Adonis Brook, NP   600 mg at 02/10/15 0840  . hydrochlorothiazide (HYDRODIURIL) tablet 25 mg  25 mg Oral Daily Craige Cotta, MD   25 mg at 02/10/15 5621   And  . irbesartan (AVAPRO) tablet 300 mg  300  mg Oral Daily Craige Cotta, MD   300 mg at 02/10/15 0653  . insulin aspart (novoLOG) injection 0-15 Units  0-15 Units Subcutaneous TID WC Sanjuana Kava, NP   Stopped at 02/07/15 1209  . insulin aspart (novoLOG) injection 4 Units  4 Units Subcutaneous TID WC Sanjuana Kava, NP   4 Units at 02/10/15 0650  . magnesium hydroxide (MILK OF MAGNESIA) suspension 30 mL  30 mL Oral Daily PRN Beau Fanny, FNP      . metFORMIN (GLUCOPHAGE-XR) 24 hr tablet 500 mg  500 mg Oral Q breakfast Beau Fanny, FNP   500 mg at 02/10/15 0840  . traZODone (DESYREL) tablet 50 mg  50 mg Oral QHS PRN Adonis Brook, NP   50 mg at 02/09/15 2200    Lab Results:  Results for orders placed or performed during the hospital encounter of 02/05/15 (from the past 48 hour(s))   Glucose, capillary     Status: None   Collection Time: 02/08/15  4:30 PM  Result Value Ref Range   Glucose-Capillary 99 65 - 99 mg/dL   Comment 1 Notify RN    Comment 2 Document in Chart   Glucose, capillary     Status: None   Collection Time: 02/08/15  8:39 PM  Result Value Ref Range   Glucose-Capillary 90 65 - 99 mg/dL  Glucose, capillary     Status: Abnormal   Collection Time: 02/09/15  6:19 AM  Result Value Ref Range   Glucose-Capillary 102 (H) 65 - 99 mg/dL   Comment 1 Notify RN   Glucose, capillary     Status: Abnormal   Collection Time: 02/09/15  8:55 PM  Result Value Ref Range   Glucose-Capillary 152 (H) 65 - 99 mg/dL  Glucose, capillary     Status: Abnormal   Collection Time: 02/10/15  6:28 AM  Result Value Ref Range   Glucose-Capillary 102 (H) 65 - 99 mg/dL  Glucose, capillary     Status: Abnormal   Collection Time: 02/10/15 12:02 PM  Result Value Ref Range   Glucose-Capillary 106 (H) 65 - 99 mg/dL   Comment 1 Notify RN    Comment 2 Document in Chart     Physical Findings: AIMS: Facial and Oral Movements Muscles of Facial Expression: None, normal Lips and Perioral Area: None, normal Jaw: None, normal Tongue: None, normal,Extremity Movements Upper (arms, wrists, hands, fingers): None, normal Lower (legs, knees, ankles, toes): None, normal, Trunk Movements Neck, shoulders, hips: None, normal, Overall Severity Severity of abnormal movements (highest score from questions above): None, normal Incapacitation due to abnormal movements: None, normal Patient's awareness of abnormal movements (rate only patient's report): No Awareness, Dental Status Current problems with teeth and/or dentures?: No Does patient usually wear dentures?: No  CIWA:  CIWA-Ar Total: 0 COWS:     Musculoskeletal: Strength & Muscle Tone: within normal limits Gait & Station: normal Patient leans: N/A  Psychiatric Specialty Exam: Review of Systems  Respiratory: Positive for cough.   All  other systems reviewed and are negative.  no chest pain, no shortness of breath, no vomiting , reports some dizziness   Blood pressure 151/111, pulse 83, temperature 97.5 F (36.4 C), temperature source Oral, resp. rate 20, height 5' 9.5" (1.765 m), weight 127.461 kg (281 lb), SpO2 96 %.Body mass index is 40.92 kg/(m^2).  General Appearance: Fairly Groomed  Patent attorney::  Fair  Speech:  Normal Rate- more verbal and communicative today  Volume:  Decreased  Mood:  Depressed  Affect: constricted affect, does smile briefly at times   Thought Process:  Linear  Orientation:  Other:  fully alert and attentive   Thought Content:  denies hallucinations, no delusions  Remains ruminative about recent stressors   Suicidal Thoughts:  at this time denies plan or intention of SI and contracts for safety on unit, but states he still has passive thoughts of death, which he attributes to stressors as above   Homicidal Thoughts:  No  Memory:  recent and remote grossly intact   Judgement:  Other:  improved   Insight:  Fair  Psychomotor Activity:  Normal  Concentration:  Good  Recall:  Good  Fund of Knowledge:Good  Language: Good  Akathisia:  Negative  Handed:  Right  AIMS (if indicated):     Assets:  Communication Skills Desire for Improvement Resilience  ADL's:  Intact  Cognition: WNL  Sleep:  Number of Hours: 6.25  Assessment - patient remains depressed, sad, ruminative about recent break up, but not suicidal- able to contract for safety on unit.  Behavior on unit calm, and gradually becoming less isolated .Thus far tolerating Wellbutrin XL well . Reports dizziness this AM, but at this time resolved .  Treatment Plan Summary: Daily contact with patient to assess and evaluate symptoms and progress in treatment, Medication management, Plan inpatient admission and medications as below  Encourage ongoing milieu, group participation to work on coping skills and symptom reduction Increase  Wellbutrin XL  to 300 mgrs QAM  For symptoms of depression   Continue Glucophage/insulin for DM management Continue HCTZ/ Irbesartan for management of HTN Continue Trazodone 50 mgrs QHS PRN for insomnia as needed.  Added order to give patient additional Trazodone 50 mg if first dose is effective. Increased Mucinex 1200 mg BID for chest congestion.  Velna HatchetSheila May Agustin 02/10/2015, 2:11 PM  Reviewed the information documented and agree with the treatment plan.  Aarianna Hoadley,JANARDHAHA R. 02/11/2015 11:43 AM

## 2015-02-10 NOTE — [facility] Group Notes (Signed)
Adult Therapy Group Note  Date:  02/10/2015 Time:  1:45-2:45 PM  Group Topic/Focus:  Managing Feelings:   The focus of this group was to identify what feelings patients have difficulty handling and develop a plan to handle them in a healthier way upon discharge.  Focus was on anger outbursts and an Anger Evaluation tool was used to elicit thought and motivate toward change.  Participation Level:  Active  Participation Quality:  Attentive and Sharing  Affect:  Blunted  Cognitive:  Appropriate and Oriented  Insight: Good  Engagement in Group:  Engaged  Modes of Intervention:  Discussion and Motivational Interviewing  Additional Comments:  Jeffrey HiddenGary was quite engaged in group and participated more than most other participants.  He was able to talk about an angry outburst/episode as requested and identify his fears, values and beliefs associated with the incident.  He concluded that he needs to learn more coping skills.  Jeffrey Marquez, Jeffrey Marquez 02/10/2015, 3:46 PM

## 2015-02-10 NOTE — Progress Notes (Signed)
D) Pt has been attending the groups and has participated minimally, but has participated. Affect is flat and mood depressed. States he has not been feeling well due to his cold symptoms but since "I have started the medicine for my cold I feel better". Pt rates his depression at 7, hopelessness at a 7 and his anxiety at a 7. Denies SI and HI.  A) Pt given support, reassurance and praise. Encouragement given. Provided with a 1:1. Therapeutic humor used with Pt. Encouragement given to Pt to go to the groups. R) Pt has been out in the milieu a bit more today and less time spent in his bed. Some moments of smiling and laughing

## 2015-02-11 LAB — GLUCOSE, CAPILLARY
GLUCOSE-CAPILLARY: 102 mg/dL — AB (ref 65–99)
GLUCOSE-CAPILLARY: 105 mg/dL — AB (ref 65–99)
GLUCOSE-CAPILLARY: 100 mg/dL — AB (ref 65–99)
Glucose-Capillary: 101 mg/dL — ABNORMAL HIGH (ref 65–99)
Glucose-Capillary: 97 mg/dL (ref 65–99)
Glucose-Capillary: 120 mg/dL — ABNORMAL HIGH (ref 65–99)

## 2015-02-11 NOTE — Progress Notes (Signed)
[facility] Group Notes:  (Nursing/MHT/Case Management/Adjunct)  Date:  02/11/2015  Time:  11:09 PM  Type of Therapy:  Psychoeducational Skills  Participation Level:  Minimal  Participation Quality:  Attentive  Affect:  Blunted  Cognitive:  Lacking  Insight:  Limited  Engagement in Group:  Limited  Modes of Intervention:  Education  Summary of Progress/Problems: The patient expressed in group that he had a good day overall and that he was grateful to be "alive". The patient's support system will be his church.   Jeffrey Marquez S 02/11/2015, 11:09 PM

## 2015-02-11 NOTE — Progress Notes (Signed)
D) Pt has been attending the groups and interacting with his peers. Pt approached this writer stating that the groups have really helped him while he has been here. Also attributes his low blood sugars to eating the right things and being on a schedule. Pt's affect brightens up when talking in group and in 1:1's. States he is happy that he has come here to work on himself. States he has always been the type of person that will listen to what others say and take it to heart. Now, "I know that I pick others people stuff up and I don't have to. Just because they say something to me, doesn't make it the truth. Pt rates his depression at a 6, hopelessness at a 6 and his anxiety at a 0. Denies SI and HI A) Pt provided with a 1:1. Given support, reassurance and praise. Encouragement provided R) Pt denies SI and HI.

## 2015-02-11 NOTE — [facility] Group Notes (Signed)
[facility] Group Notes:  (Nursing/MHT/Case Management/Adjunct)  Date:  02/11/2015  Time:  1100  Type of Therapy:  Nurse Education  /  Life Skills :  The group is focused on helping patients identify healthy support systems as well as identify how to develop healthy support systems.  Participation Level:  Active  Participation Quality:  Attentive  Affect:  Appropriate  Cognitive:  Oriented  Insight:  Improving  Engagement in Group:  Engaged  Modes of Intervention:  Education  Summary of Progress/Problems:  Rich BraveDuke, Jaquis Picklesimer Lynn 02/11/2015, 1:37 PM

## 2015-02-11 NOTE — [facility] Group Notes (Signed)
[facility] Group Notes:  (Clinical Social Work)   02/11/2015 1:15-2:15PM  Summary of Progress/Problems:   The main focus of today's process group was to   1)  discuss the importance of adding supports  2)  define health supports versus unhealthy supports  3)  identify the patient's current unhealthy supports and plan how to handle them  4)  Identify the patient's current healthy supports and plan what to add.  An emphasis was placed on using counselor, doctor, therapy groups, 12-step groups, and problem-specific support groups to expand supports.    The patient expressed full comprehension of the concepts presented, and agreed that there is a need to add more supports.  The patient was late to group and mostly listened, but did interject a few appropriate comments at times.  Type of Therapy:  Process Group with Motivational Interviewing  Participation Level:  Active  Participation Quality:  Attentive  Affect:  Blunted  Cognitive:  Appropriate and Oriented  Insight:  Engaged  Engagement in Therapy:  Engaged  Modes of Intervention:   Education, Support and Processing, Activity  Ambrose MantleMareida Grossman-Orr, LCSW 02/11/2015   4:32 PM

## 2015-02-11 NOTE — Progress Notes (Signed)
Patient has been up and active on the unit, attended group this evening and has voiced no complaints. Patient currently denies having pain, -si/hi/a/v hall. Support and encouragement offered, safety maintained on unit, will continue to monitor.  

## 2015-02-12 LAB — GLUCOSE, CAPILLARY
GLUCOSE-CAPILLARY: 130 mg/dL — AB (ref 65–99)
Glucose-Capillary: 109 mg/dL — ABNORMAL HIGH (ref 65–99)

## 2015-02-12 MED ORDER — VALSARTAN-HYDROCHLOROTHIAZIDE 320-25 MG PO TABS: 1.0000 | ORAL_TABLET | Freq: Every day | ORAL | Status: DC

## 2015-02-12 MED ORDER — BUPROPION HCL ER (XL) 300 MG PO TB24: 300.0000 mg | ORAL_TABLET | Freq: Every day | ORAL | Status: DC

## 2015-02-12 MED ORDER — TRAZODONE HCL 50 MG PO TABS: 50.0000 mg | ORAL_TABLET | Freq: Every evening | ORAL | Status: DC | PRN

## 2015-02-12 MED ORDER — METFORMIN HCL ER 500 MG PO TB24: 500.0000 mg | ORAL_TABLET | Freq: Every day | ORAL | Status: DC

## 2015-02-12 MED ORDER — POTASSIUM CHLORIDE ER 20 MEQ PO TBCR: 20.0000 meq | EXTENDED_RELEASE_TABLET | Freq: Every day | ORAL | Status: DC

## 2015-02-12 MED ORDER — ATORVASTATIN CALCIUM 10 MG PO TABS: 10.0000 mg | ORAL_TABLET | Freq: Every day | ORAL | Status: DC

## 2015-02-12 MED ORDER — HYDRALAZINE HCL 10 MG PO TABS
10.0000 mg | ORAL_TABLET | Freq: Three times a day (TID) | ORAL | Status: DC
  Administered 2015-02-12 – 2015-02-13 (×3): 10 mg via ORAL
  Filled 2015-02-12 (×6): qty 1

## 2015-02-12 MED ORDER — GUAIFENESIN ER 600 MG PO TB12: 1200.0000 mg | ORAL_TABLET | Freq: Two times a day (BID) | ORAL | Status: DC

## 2015-02-12 MED ORDER — BUPROPION HCL ER (XL) 150 MG PO TB24
150.0000 mg | ORAL_TABLET | Freq: Every day | ORAL | Status: DC
Start: 2015-02-13 — End: 2015-02-13
  Administered 2015-02-13: 150 mg via ORAL
  Filled 2015-02-12 (×2): qty 1

## 2015-02-12 NOTE — Discharge Summary (Signed)
Physician Discharge Summary Note  Patient:  Jeffrey Marquez is an 55 y.o., male MRN:  [id number] DOB:  1960/04/29 Patient phone:  [phone] (home)  Patient address:   [address] [place] [address],  Total Time spent with patient: 30 minutes  Date of Admission:  02/05/2015 Date of Discharge: 02/12/2015  Reason for Admission:  Depression with suicidal thoughts   Principal Problem: MDD (major depressive disorder), recurrent severe, without psychosis [facility]) Discharge Diagnoses: Patient Active Problem List   Diagnosis Date Noted  . MDD (major depressive disorder), recurrent severe, without psychosis (HCC) [F33.2] 02/05/2015  . Unstable angina (HCC) [I20.0] 10/18/2014  . Essential hypertension, malignant [I10] 01/10/2014  . DM II (diabetes mellitus, type II), controlled (HCC) [E11.9] 11/27/2013    Past Psychiatric History: See H & P  Past Medical History:  Past Medical History  Diagnosis Date  . Diverticulitis   . Hypertension   . Diverticula of colon 2012  . Diabetes mellitus without complication (HCC)   . Obesity   . Drug abuse   . Alcohol abuse     Past Surgical History  Procedure Laterality Date  . Cardiac catheterization N/A 10/18/2014    Procedure: Left Heart Cath and Coronary Angiography;  Surgeon: Tonny Bollman, MD;  Location: [facility] INVASIVE CV LAB;  Service: Cardiovascular;  Laterality: N/A;   Family History:  Family History  Problem Relation Age of Onset  . Diabetes Mother   . Diabetes Brother    Family Psychiatric  History: See H & P Social History:  History  Alcohol Use  . Yes     History  Drug Use  . Yes  . Special: Cocaine    Social History   Social History  . Marital Status: Divorced    Spouse Name: N/A  . Number of Children: N/A  . Years of Education: N/A   Social History Main Topics  . Smoking status: Never Smoker   . Smokeless tobacco: Never Used  . Alcohol Use: Yes  . Drug Use: Yes    Special: Cocaine  . Sexual Activity: Not Asked    Other Topics Concern  . None   Social History Narrative    Hospital Course:    Jeffrey Marquez is an 55 y.o. male, divorced, black who presents unaccompanied to [facility] ED after being transported under IVC from [place] due to a suicide attempt. Pt cannot return to [facility] after medical clearance because they do not have a bed available. Pt reports he has a history of depression and has felt increasingly depressed for the past 1-2 months. He states several different stressors are making him "want to give up." He reports he attempted suicide within the past twenty four hours by ingesting three pints of gin, $50 worth of cocaine and an unknown quantity of his home medications. The pill ingestion occurred at 11am and he states he threw up soon after and saw it was mostly pill fragments. Pt states he would have overdosed on non-prescription sleeping pills but the store was not open. He says he also has thoughts of walking into traffic. He continues to verbalize suicidal ideation and states "I don't want to be here anymore." He reports a history of two previous suicide attempts by overdose. Pt reports symptoms including crying spells, social withdrawal, loss of interest in usual pleasures, anhedonia, fatigue, irritability, decreased concentration, decreased sleep, decreased appetite and feelings of guilt and hopelessness. Pt reports he has lost approximately forty pounds in two months due to lack of appetite.  He denies recent homicidal ideation or history of violence. He denies any history of psychotic symptoms. He says he has used cocaine when he was younger, that he hasn't used in years, and his use of cocaine at this time was in effort to kill himself.          Jeffrey Marquez was admitted to the adult 400 unit. He was evaluated and his symptoms were identified. Medication management was discussed and initiated. Patient was started on Wellbutrin XL 150 mg daily for depressive symptoms. This medication would  also not worsen his symptoms of sexual dysfunction. He was oriented to the unit and encouraged to participate in unit programming. Medical problems were identified and treated appropriately. Patient was continued on home medications to treat Hypertension and Type two Diabetes.         The patient was evaluated each day by a clinical provider to ascertain the patient's response to treatment.  Improvement was noted by the patient's report of decreasing symptoms, improved sleep and appetite, affect, medication tolerance, behavior, and participation in unit programming.  He was asked each day to complete a self inventory noting mood, mental status, pain, new symptoms, anxiety and concerns. Per nursing notes the patient was very engaged in group therapy and appeared motivated to work on the problems that resulted in his admission.          He responded well to medication and being in a therapeutic and supportive environment. His Wellbutrin XL was increased to 300 mg daily to further treat his depressive symptoms. Positive and appropriate behavior was noted and the patient was motivated for recovery.  The patient worked closely with the treatment team and case manager to develop a discharge plan with appropriate goals. Coping skills, problem solving as well as relaxation therapies were also part of the unit programming.         By the day of discharge he was in much improved condition than upon admission.  Symptoms were reported as significantly decreased or resolved completely. The patient denied SI/HI and voiced no AVH. He was motivated to continue taking medication with a goal of continued improvement in mental health.  Jeffrey Marquez was discharged home with a plan to follow up as noted below. He was provided with a seven day supply of medications due to being identified as not having any insurance along with prescriptions. Patient was to be picked up by [facility] Department due to having a court order.    Physical Findings: AIMS: Facial and Oral Movements Muscles of Facial Expression: None, normal Lips and Perioral Area: None, normal Jaw: None, normal Tongue: None, normal,Extremity Movements Upper (arms, wrists, hands, fingers): None, normal Lower (legs, knees, ankles, toes): None, normal, Trunk Movements Neck, shoulders, hips: None, normal, Overall Severity Severity of abnormal movements (highest score from questions above): None, normal Incapacitation due to abnormal movements: None, normal Patient's awareness of abnormal movements (rate only patient's report): No Awareness, Dental Status Current problems with teeth and/or dentures?: No Does patient usually wear dentures?: No  CIWA:  CIWA-Ar Total: 1 COWS:  COWS Total Score: 2  Musculoskeletal: Strength & Muscle Tone: within normal limits Gait & Station: normal Patient leans: N/A  Psychiatric Specialty Exam: Review of Systems  Psychiatric/Behavioral: Positive for depression (Stable ) and substance abuse (Positive for cocaine on admission ). Negative for suicidal ideas, hallucinations and memory loss. The patient is not nervous/anxious and does not have insomnia.     Blood pressure 156/87, pulse 80, temperature 97.7  F (36.5 C), temperature source Oral, resp. rate 18, height 5' 9.5" (1.765 m), weight 127.461 kg (281 lb), SpO2 96 %.Body mass index is 40.92 kg/(m^2).  See Physician SRA     Have you used any form of tobacco in the last 30 days? (Cigarettes, Smokeless Tobacco, Cigars, and/or Pipes): No  Has this patient used any form of tobacco in the last 30 days? (Cigarettes, Smokeless Tobacco, Cigars, and/or Pipes)  No  Metabolic Disorder Labs:  Lab Results  Component Value Date   HGBA1C 6.5* 02/08/2015   MPG 140 02/08/2015   MPG 140* 11/18/2013   No results found for: PROLACTIN Lab Results  Component Value Date   CHOL 235* 11/18/2013   TRIG 306* 11/18/2013   HDL 35* 11/18/2013   CHOLHDL 6.7 11/18/2013   VLDL 61*  11/18/2013   LDLCALC 139* 11/18/2013    See Psychiatric Specialty Exam and Suicide Risk Assessment completed by Attending Physician prior to discharge.  Discharge destination:  Home  Is patient on multiple antipsychotic therapies at discharge:  No   Has Patient had three or more failed trials of antipsychotic monotherapy by history:  No  Recommended Plan for Multiple Antipsychotic Therapies: NA      Discharge Instructions    Discharge instructions    Complete by:  As directed   Please follow up with your Primary Care Provider for further management of chronic medical problems such as Diabetes and elevated blood pressure. Notes in the chart indicate that medications for these conditions have not been consistently taken prior to admission to [facility].            Medication List    STOP taking these medications        carvedilol 12.5 MG tablet  Commonly known as:  COREG     colchicine 0.6 MG tablet     hydrALAZINE 25 MG tablet  Commonly known as:  APRESOLINE     indomethacin 25 MG capsule  Commonly known as:  INDOCIN      TAKE these medications      Indication   aspirin-acetaminophen-caffeine 250-250-65 MG tablet  Commonly known as:  EXCEDRIN MIGRAINE  Take 2 tablets by mouth every 6 (six) hours as needed for headache.   Indication:  Headache     atorvastatin 10 MG tablet  Commonly known as:  LIPITOR  Take 1 tablet (10 mg total) by mouth daily.   Indication:  Elevation of Both Cholesterol and Triglycerides in Blood     buPROPion 300 MG 24 hr tablet  Commonly known as:  WELLBUTRIN XL  Take 1 tablet (300 mg total) by mouth daily.   Indication:  Major Depressive Disorder     guaiFENesin 600 MG 12 hr tablet  Commonly known as:  MUCINEX  Take 2 tablets (1,200 mg total) by mouth 2 (two) times daily. May purchase over the counter as desired.   Indication:  Cough     metFORMIN 500 MG 24 hr tablet  Commonly known as:  GLUCOPHAGE XR  Take 1 tablet (500 mg total) by mouth  daily with breakfast.   Indication:  Type 2 Diabetes     nitroGLYCERIN 0.4 MG SL tablet  Commonly known as:  NITROSTAT  Place 1 tablet (0.4 mg total) under the tongue every 5 (five) minutes as needed for chest pain.      Potassium Chloride ER 20 MEQ Tbcr  Take 20 mEq by mouth daily.   Indication:  To prevent low potassium due to diuretic use  traZODone 50 MG tablet  Commonly known as:  DESYREL  Take 1 tablet (50 mg total) by mouth at bedtime as needed for sleep.   Indication:  Trouble Sleeping     valsartan-hydrochlorothiazide 320-25 MG tablet  Commonly known as:  DIOVAN-HCT  Take 1 tablet by mouth daily.   Indication:  High Blood Pressure       Follow-up Information    [facility]   Specialty:  Behavioral Health   Why:  This is a walk-in appointment. Please go between 8am-3pm Monday-Friday to be seen for your hospital discharge appt and be established with a therapist and psychiatrist. It is suggested that you arrive early to avoid longer wait times   Contact information:   [address] [place] [address] [phone]       Follow up with [facility]. Schedule an appointment as soon as possible for a visit today.   Why:  If needed for follow up of chronic medical conditions as they accept patient's with no insurance    Contact information:   [address] [place] [phone]      Follow-up recommendations:   As above   Comments:   Take all your medications as prescribed by your mental healthcare provider.  Report any adverse effects and or reactions from your medicines to your outpatient provider promptly.  Patient is instructed and cautioned to not engage in alcohol and or illegal drug use while on prescription medicines.  In the event of worsening symptoms, patient is instructed to call the crisis hotline, 911 and or go to the nearest ED for appropriate evaluation and treatment of  symptoms.  Follow-up with your primary care provider for your other medical issues, concerns and or health care needs.   SignedFransisca Kaufmann: Anuel Sitter, NP-C 02/12/2015, 1:25 PM

## 2015-02-12 NOTE — Progress Notes (Signed)
Pt already on appropriate antihypertensive regimen. Add hydralazine 10 mg PO TID and taper the dose as clinically indicated. Please call me with any other questions or concerns.   Debbora PrestoMAGICK-Deniqua Perry, MD  [facility] Pager [phone] Cell [phone]  If 7PM-7AM, please contact night-coverage [email or web address] Password [email or web address]

## 2015-02-12 NOTE — BHH Suicide Risk Assessment (Signed)
[facility] Discharge Suicide Risk Assessment   Demographic Factors:  55 year old man, divorced, states currently homeless after recent break up with GF  Total Time spent with patient: 30 minutes  Musculoskeletal: Strength & Muscle Tone: within normal limits Gait & Station: normal Patient leans: N/A  Psychiatric Specialty Exam: Physical Exam  ROS  Blood pressure 156/87, pulse 80, temperature 97.7 F (36.5 C), temperature source Oral, resp. rate 18, height 5' 9.5" (1.765 m), weight 281 lb (127.461 kg), SpO2 96 %.Body mass index is 40.92 kg/(m^2).  General Appearance: well groomed   Eye Contact::  Good  Speech:  Normal Rate409  Volume:  Normal  Mood:  " pretty good, I feel things are moving forward "  Affect:  Appropriate and more reactive   Thought Process:  Linear  Orientation:  Full (Time, Place, and Person)  Thought Content:  denies hallucinations, no delusions   Suicidal Thoughts:  No- denies any hallucinations or any self injurious ideations  Homicidal Thoughts:  No denies any thoughts of hurting anyone or any HI  Memory:  recent and remote grossly intact   Judgement:   Improved   Insight:  improved   Psychomotor Activity:  Normal  Concentration:  Good  Recall:  Good  Fund of Knowledge:Good  Language: Good  Akathisia:  Negative  Handed:  Right  AIMS (if indicated):     Assets:  Communication Skills Desire for Improvement Resilience  Sleep:  Number of Hours: 6.25  Cognition: WNL  ADL's:  Intact   Have you used any form of tobacco in the last 30 days? (Cigarettes, Smokeless Tobacco, Cigars, and/or Pipes): No  Has this patient used any form of tobacco in the last 30 days? (Cigarettes, Smokeless Tobacco, Cigars, and/or Pipes) No  Mental Status Per Nursing Assessment::   On Admission:  Suicidal ideation indicated by patient, Suicide plan, Plan includes specific time, place, or method, Self-harm thoughts, Self-harm behaviors, Intention to act on suicide plan, Belief that plan  would result in death  Current Mental Status by Physician: At this time patient is improved compared to admission- he presents fully alert , attentive, well groomed, good eye contact , speech normal , no psychomotor restlessness or agitation, mood is described as improved and currently minimizes depression, no thought disorder, no SI or HI, no psychotic symptoms, future oriented  Loss Factors: Recent break up with GF , current homelessness   Historical Factors: Prior history of depression, history of suicide attempt in the past   Risk Reduction Factors:   Positive coping skills or problem solving skills/ sense of responsibility to family   Continued Clinical Symptoms:  As noted, currently improved   Cognitive Features That Contribute To Risk:  No gross cognitive deficits noted upon discharge. Is alert , attentive, and oriented x 3   Suicide Risk:  Mild:  Suicidal ideation of limited frequency, intensity, duration, and specificity.  There are no identifiable plans, no associated intent, mild dysphoria and related symptoms, good self-control (both objective and subjective assessment), few other risk factors, and identifiable protective factors, including available and accessible social support.  Principal Problem: MDD (major depressive disorder), recurrent severe, without psychosis [facility]) Discharge Diagnoses:  Patient Active Problem List   Diagnosis Date Noted  . MDD (major depressive disorder), recurrent severe, without psychosis (HCC) [F33.2] 02/05/2015  . Unstable angina (HCC) [I20.0] 10/18/2014  . Essential hypertension, malignant [I10] 01/10/2014  . DM II (diabetes mellitus, type II), controlled (HCC) [E11.9] 11/27/2013    Follow-up Information  [facility]   Specialty:  Behavioral Health   Why:  This is a walk-in appointment. Please go between 8am-3pm Monday-Friday to be seen for your hospital discharge appt and be established with a therapist and psychiatrist. It  is suggested that you arrive early to avoid longer wait times   Contact information:   [address] [place] [address] [phone]       Follow up with [facility]. Schedule an appointment as soon as possible for a visit today.   Why:  If needed for follow up of chronic medical conditions as they accept patient's with no insurance    Contact information:   [address] [place] [phone]      Plan Of Care/Follow-up recommendations:  Activity:  as tolerated  Diet:  Heart Healthy, Diabetic Diet  Tests:  NA Other:  See below   Is patient on multiple antipsychotic therapies at discharge:  No   Has Patient had three or more failed trials of antipsychotic monotherapy by history:  No  Recommended Plan for Multiple Antipsychotic Therapies: NA Patient is leaving unit in good spirits . As per chart notes, going to police custody- patient aware, states it is related to an old charge, and is hopeful he will be released soon/promptly  Patient encouraged [facility] to continue management of chronic medical illnesses .  [facility].   Shalaya Swailes 02/12/2015, 1:29 PM

## 2015-02-12 NOTE — Progress Notes (Signed)
Recreation Therapy Notes  Date: 01.09.2017 Time: 9:30am Location: [address] Dayroom   Group Topic: Stress Management  Goal Area(s) Addresses:  Patient will actively participate in stress management techniques presented during session.   Behavioral Response: Appropriate, Engaged   Intervention: Stress management techniques  Activity :  Deep Breathing and Progressive Muscle Relaxation. LRT provided instruction and demonstration on practice of Progressive Muscle Relaxation. Technique was coupled with deep breathing.   Education:  Stress Management, Discharge Planning.   Education Outcome: Acknowledges education  Clinical Observations/Feedback: Patient actively engaged in technique introduced, expressed no concerns and demonstrated ability to practice independently post d/c.   Lathen Seal L Laquita Harlan, LRT/CTRS        Teonia Yager L 02/12/2015 2:54 PM 

## 2015-02-12 NOTE — BHH Group Notes (Signed)
[facility] Note  02/12/2015 8:45 AM  Participation Quality: Alert, Appropriate and Oriented  Mood/Affect: Appropriate  Depression Rating: 5  Anxiety Rating: 5  Thoughts of Suicide: Pt denies SI/HI  Will you contract for safety? Yes  Current AVH: Pt denies  Plan for Discharge/Comments: Pt attended discharge planning group and actively participated in group. CSW discussed suicide prevention education with the group and encouraged them to discuss discharge planning and any relevant barriers. Pt reports that he is making progress and that he had a good weekend. Pt reports that his depression and anxiety are at baseline.  Transportation Means: Pt reports access to transportation  Supports: No supports mentioned at this time  Chad CordialLauren Carter, LCSWA 02/12/2015 10:00 AM

## 2015-02-12 NOTE — Tx Team (Signed)
Interdisciplinary Treatment Plan Update (Adult) Date: 02/12/2015   Date: 02/12/2015 11:28 AM  Progress in Treatment:  Attending groups: Yes  Participating in groups: Yes Taking medication as prescribed: Yes  Tolerating medication: Yes  Family/Significant othe contact made: Yes with daughter Patient understands diagnosis: Yes Discussing patient identified problems/goals with staff: Yes  Medical problems stabilized or resolved: Yes  Denies suicidal/homicidal ideation: No, Pt endorses SI and desire to die Patient has not harmed self or Others: Yes   New problem(s) identified: None identified at this time.    Discharge Plan or Barriers: CSW will assess for appropriate discharge plan and relevant barriers.   02/09/15: Per judge's order, Pt to be picked up by police at [place]. Has follow-up scheduled at [facility]  Additional comments:  Patient and CSW reviewed pt's identified goals and treatment plan. Patient verbalized understanding and agreed to treatment plan. CSW reviewed [facility] "Discharge Process and Patient Involvement" Form. Pt verbalized understanding of information provided and signed form.   Reason for Continuation of Hospitalization:  Anxiety Depression Medication stabilization Suicidal ideation Withdrawal symptoms  Estimated length of stay: 0 days; Pt stable for DC  Review of initial/current patient goals per problem list:   1.  Goal(s): Patient will participate in aftercare plan  Met:  Adequate for DC  Target date: 3-5 days from date of admission   As evidenced by: Patient will participate within aftercare plan AEB aftercare provider and housing plan at discharge being identified.  02/06/15: CSW to work with Pt to assess for appropriate discharge plan and faciliate appointments and referrals as needed prior to d/c. 02/09/15: Per judge's order, Pt to be picked up by police at [place]. Has follow-up scheduled at [facility]  2.  Goal (s): Patient will exhibit decreased depressive symptoms and  suicidal ideations.  Met:  Adequate for DC  Target date: 3-5 days from date of admission   As evidenced by: Patient will utilize self rating of depression at 3 or below and demonstrate decreased signs of depression or be deemed stable for discharge by MD. 02/06/15: Pt was admitted with symptoms of depression, rating 10/10. Pt continues to present with flat affect and depressive symptoms.  Pt will demonstrate decreased symptoms of depression and rate depression at 3/10 or lower prior to discharge. 02/09/15: Pt continues to rate depression highly, however affect and insight improving. 02/12/2015: Although Pt rates depression at 5/10, MD feels that Pt's symptoms have decreased to the point that they can be managed in an outpatient setting.   3.  Goal(s): Patient will demonstrate decreased signs and symptoms of anxiety.  Met:  Adequate for DC  Target date: 3-5 days from date of admission   As evidenced by: Patient will utilize self rating of anxiety at 3 or below and demonstrated decreased signs of anxiety, or be deemed stable for discharge by MD 02/06/15: Pt was admitted with increased levels of anxiety and is currently rating those symptoms highly. Pt will demonstrated decreased symptoms of anxiety and rate it at 3/10 prior to d/c. 02/09/15: Pt continues to rate anxiety at high levels 02/12/2015: Although Pt rates anxiety at 5/10, MD feels that Pt's symptoms have decreased to the point that they can be managed in an outpatient setting.   Attendees:  Patient:    Family:    Physician: Dr. Parke Poisson, MD  02/12/2015 11:28 AM  Nursing: Darrol Angel, RN 02/12/2015 11:28 AM  Clinical Social Worker Peri Maris, [place] 02/12/2015 11:28 AM  Other: ,  02/12/2015 11:28 AM  Clinical:  Grayland Ormond, RN  02/12/2015 11:28 AM  Other: , RN Charge Nurse 02/12/2015 11:28 AM  Other: Hilda Lias, [place], [place] Work [phone]

## 2015-02-12 NOTE — Progress Notes (Signed)
D: Pt presents with flat affect and depressed mood. Pt rates depression 4/10. Anxiety 4/10. Hopeless 4/10. Pt denies suicidal thoughts. Pt stated goal is to "keep moving forward and self work". Pt verbalized concerns about leaving from the hospital and having to be arrested for old probation issues.  Pt compliant with taking meds and attending groups. Pt hypertensive but asymptomatic. Pt encouraged to verbalize new onset of symptoms to Clinical research associatewriter. A: Medications administered as ordered per MD. Verbal support provided. Pt encouraged to attend groups. 15 minute checks performed for safety. R: Pt receptive to tx.

## 2015-02-12 NOTE — Progress Notes (Signed)
Assumed care @2330 D: Patient resting in bed with eyes closed.  Respirations even and unlabored.  Patient appears to be in no apparent distress. A: Staff to monitor Q 15 mins for safety.   R:Patient remains safe on the unit.  

## 2015-02-12 NOTE — [facility] Group Notes (Signed)
[facility] LCSW Group Therapy  02/12/2015 1:15pm  Type of Therapy:  Group Therapy vercoming Obstacles  Pt arrived to group at the conclusion; did not participate  Chad CordialLauren Carter, LCSWA 02/12/2015 2:51 PM

## 2015-02-12 NOTE — Progress Notes (Addendum)
  [facility] :  Will you be returning to the same living situation after discharge:  No. [facility]; will seek shelter placement at [place]. At discharge, do you have transportation home?: Yes, Pt provided with bus pass Do you have the ability to pay for your medications: Yes,  Pt provided with 7-day supply and prescription  Release of information consent forms completed and in the chart;  Patient's signature needed at discharge.  Patient to Follow up at: Follow-up Information    [facility]   Specialty:  Behavioral Health   Why:  This is a walk-in appointment. Please go between 8am-3pm Monday-Friday to be seen for your hospital discharge appt and be established with a therapist and psychiatrist. It is suggested that you arrive early to avoid longer wait times   Contact information:   [address] [place] [address] [phone]       Follow up with [facility]. Schedule an appointment as soon as possible for a visit today.   Why:  If needed for follow up of chronic medical conditions as they accept patient's with no insurance    Contact information:   [address] [place] [phone]      Next level of care provider has access to [facility] Link:no  Safety Planning and Suicide Prevention discussed: Yes,  with daughter; see SPE note for further details  Have you used any form of tobacco in the last 30 days? (Cigarettes, Smokeless Tobacco, Cigars, and/or Pipes): No  Has patient been referred to the Quitline?: N/A patient is not a smoker  Patient has been referred for addiction treatment: Yes- see above  Elaina HoopsCarter, Miasia Crabtree M 02/12/2015, 11:32 AM

## 2015-02-12 NOTE — Progress Notes (Signed)
Patient ID: Jeffrey Marquez, male   DOB: 27-Jun-1960, 55 y.o.   MRN: [id number] [facility]  02/12/2015 1:50 PM Jeffrey Marquez  MRN:  [id number] Subjective:  Patient reports that he is feeling better, less depressed . He is concerned about " where I might go" after being released from police custody, since he states he is currently homeless. ( as per notes, patient being discharged to police custody, which he states is due to probation violation charges )  Denies medication side effects Objective:  Case discussed with team and patient seen. Mood and affect partially improved, currently feeling " better", denies SI. Remains anxious, particularly as he states he is now homeless following break up with GF. Denies medication side effects. No disruptive behaviors on unit. Of note, patient has been  persistently elevated BP over recent days. Today BP improved but still elevated. He has history of HTN.  We discussed this- prior to admission he had not been taking any medications, to include any antihypertensive medications , for a period of several weeks . He is now on Avapro/HCTZ- denies side effects. Wellbutrin XL well tolerated at this time- patient denies side effects and feels it is helping . Wellbutrin may be advantageous for patient as a major stressor has been sexual dysfunction, and this antidepressant less likely associated with sexual side effects. However, it  may contribute to elevated BP.   Denies shortness of breath, headache, chest pain at this time  Principal Problem: MDD (major depressive disorder), recurrent severe, without psychosis (HCC) Diagnosis:   Patient Active Problem List   Diagnosis Date Noted  . MDD (major depressive disorder), recurrent severe, without psychosis (HCC) [F33.2] 02/05/2015  . Unstable angina (HCC) [I20.0] 10/18/2014  . Essential hypertension, malignant [I10] 01/10/2014  . DM II (diabetes mellitus, type II), controlled (HCC) [E11.9] 11/27/2013   Total Time  spent with patient: 20 minutes    Past Medical History:  Past Medical History  Diagnosis Date  . Diverticulitis   . Hypertension   . Diverticula of colon 2012  . Diabetes mellitus without complication (HCC)   . Obesity   . Drug abuse   . Alcohol abuse     Past Surgical History  Procedure Laterality Date  . Cardiac catheterization N/A 10/18/2014    Procedure: Left Heart Cath and Coronary Angiography;  Surgeon: Tonny BollmanMichael Cooper, MD;  [facility];  Service: Cardiovascular;  Laterality: N/A;   Family History:  Family History  Problem Relation Age of Onset  . Diabetes Mother   . Diabetes Brother     Social History:  History  Alcohol Use  . Yes     History  Drug Use  . Yes  . Special: Cocaine    Social History   Social History  . Marital Status: Divorced    Spouse Name: N/A  . Number of Children: N/A  . Years of Education: N/A   Social History Main Topics  . Smoking status: Never Smoker   . Smokeless tobacco: Never Used  . Alcohol Use: Yes  . Drug Use: Yes    Special: Cocaine  . Sexual Activity: Not Asked   Other Topics Concern  . None   Social History Narrative   Additional Social History:   Sleep:  Partially improved   Appetite: fair   Current Medications: Current Facility-Administered Medications  Medication Dose Route Frequency Provider Last Rate Last Dose  . acetaminophen (TYLENOL) tablet 650 mg  650 mg Oral Q6H PRN Beau FannyJohn C Withrow,  FNP   650 mg at 02/11/15 2052  . alum & mag hydroxide-simeth (MAALOX/MYLANTA) 200-200-20 MG/5ML suspension 30 mL  30 mL Oral Q4H PRN Beau Fanny, FNP      . atorvastatin (LIPITOR) tablet 10 mg  10 mg Oral Daily Beau Fanny, FNP   10 mg at 02/12/15 0804  . [START ON 02/13/2015] buPROPion (WELLBUTRIN XL) 24 hr tablet 150 mg  150 mg Oral Daily Azka Steger A Manali Mcelmurry, MD      . feeding supplement (GLUCERNA SHAKE) (GLUCERNA SHAKE) liquid 237 mL  237 mL Oral TID BM Craige Cotta, MD   237 mL at 02/11/15 2012   . guaiFENesin (MUCINEX) 12 hr tablet 1,200 mg  1,200 mg Oral BID Adonis Brook, NP   1,200 mg at 02/12/15 0804  . hydrochlorothiazide (HYDRODIURIL) tablet 25 mg  25 mg Oral Daily Craige Cotta, MD   25 mg at 02/12/15 0804   And  . irbesartan (AVAPRO) tablet 300 mg  300 mg Oral Daily Craige Cotta, MD   300 mg at 02/12/15 0804  . insulin aspart (novoLOG) injection 0-15 Units  0-15 Units Subcutaneous TID WC Sanjuana Kava, NP   Stopped at 02/07/15 1209  . insulin aspart (novoLOG) injection 4 Units  4 Units Subcutaneous TID WC Sanjuana Kava, NP   4 Units at 02/12/15 1205  . magnesium hydroxide (MILK OF MAGNESIA) suspension 30 mL  30 mL Oral Daily PRN Beau Fanny, FNP      . metFORMIN (GLUCOPHAGE-XR) 24 hr tablet 500 mg  500 mg Oral Q breakfast Beau Fanny, FNP   500 mg at 02/12/15 0804  . traZODone (DESYREL) tablet 50 mg  50 mg Oral QHS PRN Adonis Brook, NP   50 mg at 02/10/15 2336    Lab Results:  Results for orders placed or performed during the hospital encounter of 02/05/15 (from the past 48 hour(s))  Glucose, capillary     Status: Abnormal   Collection Time: 02/10/15  9:05 PM  Result Value Ref Range   Glucose-Capillary 120 (H) 65 - 99 mg/dL   Comment 1 Notify RN    Comment 2 Document in Chart   Glucose, capillary     Status: None   Collection Time: 02/11/15  6:19 AM  Result Value Ref Range   Glucose-Capillary 97 65 - 99 mg/dL   Comment 1 Notify RN    Comment 2 Document in Chart   Glucose, capillary     Status: Abnormal   Collection Time: 02/11/15 12:01 PM  Result Value Ref Range   Glucose-Capillary 105 (H) 65 - 99 mg/dL   Comment 1 Notify RN    Comment 2 Document in Chart   Glucose, capillary     Status: Abnormal   Collection Time: 02/11/15  5:05 PM  Result Value Ref Range   Glucose-Capillary 100 (H) 65 - 99 mg/dL   Comment 1 Notify RN    Comment 2 Document in Chart   Glucose, capillary     Status: Abnormal   Collection Time: 02/11/15  8:40 PM  Result Value  Ref Range   Glucose-Capillary 120 (H) 65 - 99 mg/dL  Glucose, capillary     Status: Abnormal   Collection Time: 02/12/15  5:51 AM  Result Value Ref Range   Glucose-Capillary 109 (H) 65 - 99 mg/dL    Physical Findings: AIMS: Facial and Oral Movements Muscles of Facial Expression: None, normal Lips and Perioral Area: None, normal Jaw: None, normal  Tongue: None, normal,Extremity Movements Upper (arms, wrists, hands, fingers): None, normal Lower (legs, knees, ankles, toes): None, normal, Trunk Movements Neck, shoulders, hips: None, normal, Overall Severity Severity of abnormal movements (highest score from questions above): None, normal Incapacitation due to abnormal movements: None, normal Patient's awareness of abnormal movements (rate only patient's report): No Awareness, Dental Status Current problems with teeth and/or dentures?: No Does patient usually wear dentures?: No  CIWA:  CIWA-Ar Total: 1 COWS:  COWS Total Score: 2  Musculoskeletal: Strength & Muscle Tone: within normal limits Gait & Station: normal Patient leans: N/A  Psychiatric Specialty Exam: Review of Systems  Respiratory: Positive for cough.   All other systems reviewed and are negative.   No headache, no chest pain, no shortness of breath, no vomiting , reports some dizziness   Blood pressure 156/87, pulse 80, temperature 97.7 F (36.5 C), temperature source Oral, resp. rate 18, height 5' 9.5" (1.765 m), weight 281 lb (127.461 kg), SpO2 96 %.Body mass index is 40.92 kg/(m^2).  General Appearance:  Improved grooming   Eye Contact::   Good   Speech:  Normal Rate-   Volume:  Normal  Mood:  Improving , less depressed   Affect:  Less constricted   Thought Process:  Linear  Orientation:  Other:  fully alert and attentive   Thought Content:  denies hallucinations, no delusions    Suicidal Thoughts:  Denies suicidal ideations, denies self injurious ideations  Homicidal Thoughts:  No  Memory:  recent and remote  grossly intact   Judgement:  Other:  improved   Insight:  Fair  Psychomotor Activity:  Normal  Concentration:  Good  Recall:  Good  Fund of Knowledge:Good  Language: Good  Akathisia:  Negative  Handed:  Right  AIMS (if indicated):     Assets:  Communication Skills Desire for Improvement Resilience  ADL's:  Intact  Cognition: WNL  Sleep:  Number of Hours: 6.25  Assessment - patient's mood and affect improved and currently no SI. He does remain anxious, but is more future oriented, and states he no longer feels hopeless. Discharge was  considered for today, but due to persistently elevated BP in spite of current antihypertensive management,k  agreed to postpone discharge until antihypertensive medications  Can be adjusted further, particularly as patient states he may be unable to see PCP promptly after discharge due to legal issues as above . Wellbutrin XL may be associated with elevated BP so will decrease dose .   Treatment Plan Summary: Daily contact with patient to assess and evaluate symptoms and progress in treatment, Medication management, Plan inpatient admission and medications as below  Encourage ongoing milieu, group participation to work on coping skills and symptom reduction Decrease   Wellbutrin XL to 150 mgrs QAM  For symptoms of depression   Continue Glucophage/insulin for DM management Continue HCTZ/ Irbesartan for management of HTN Continue Trazodone 50 mgrs QHS PRN for insomnia as needed.  Added order to give patient additional Trazodone 50 mg if first dose is effective. Request Hospitalist consult for antihypertensive medication management/adjustments.  Nehemiah Massed  MD 02/12/2015, 1:50 PM

## 2015-02-13 LAB — GLUCOSE, CAPILLARY
GLUCOSE-CAPILLARY: 101 mg/dL — AB (ref 65–99)
GLUCOSE-CAPILLARY: 89 mg/dL (ref 65–99)
GLUCOSE-CAPILLARY: 147 mg/dL — AB (ref 65–99)
Glucose-Capillary: 121 mg/dL — ABNORMAL HIGH (ref 65–99)

## 2015-02-13 MED ORDER — SERTRALINE HCL 50 MG PO TABS
50.0000 mg | ORAL_TABLET | Freq: Every day | ORAL | Status: DC
  Administered 2015-02-13 – 2015-02-14 (×2): 50 mg via ORAL
  Filled 2015-02-13 (×6): qty 1

## 2015-02-13 MED ORDER — HYDRALAZINE HCL 25 MG PO TABS
25.0000 mg | ORAL_TABLET | Freq: Three times a day (TID) | ORAL | Status: DC
  Administered 2015-02-13 – 2015-02-14 (×4): 25 mg via ORAL
  Filled 2015-02-13 (×12): qty 1

## 2015-02-13 NOTE — Progress Notes (Signed)
Patient ID: Jeffrey Marquez, male   DOB: 29-Mar-1960, 55 y.o.   MRN: [id number] [facility] Note  02/13/2015 11:56 AM Jeffrey Marquez  MRN:  [id number] Subjective:  At this time he reports feeling better.  He is tolerating medications well. He does continue to report anxiety , particularly about legal charges. Thus far tolerating medications well- denies side effects. Reports increased vivid dreams , which may be related to Trazodone, but denies nightmares or distress related to this.  Objective:  Case discussed with team and patient seen. Mood and affect have improved compared to admission . No disruptive behaviors on unit. Appreciate Hospitalist recommendations regarding anti-hypertensive medication adjustments- Hydralazine has been added- patient denies side effects thus far .  Of note, we have reviewed potential for increased BP as a side effect from Wellbutrin XL. Patient is agreeing to change antidepressant treatment in order to minimize this risk. We discussed options and he agrees to Zoloft . We reviewed side effects. Patient is future oriented, does report some anxiety about legal charges he is facing, but states he is confident things will work out well. Visible on unit, going to groups .  Principal Problem: MDD (major depressive disorder), recurrent severe, without psychosis (HCC) Diagnosis:   Patient Active Problem List   Diagnosis Date Noted  . MDD (major depressive disorder), recurrent severe, without psychosis (HCC) [F33.2] 02/05/2015  . Unstable angina (HCC) [I20.0] 10/18/2014  . Essential hypertension, malignant [I10] 01/10/2014  . DM II (diabetes mellitus, type II), controlled (HCC) [E11.9] 11/27/2013   Total Time spent with patient: 20 minutes    Past Medical History:  Past Medical History  Diagnosis Date  . Diverticulitis   . Hypertension   . Diverticula of colon 2012  . Diabetes mellitus without complication (HCC)   . Obesity   . Drug abuse   . Alcohol abuse      Past Surgical History  Procedure Laterality Date  . Cardiac catheterization N/A 10/18/2014    Procedure: Left Heart Cath and Coronary Angiography;  Surgeon: Tonny BollmanMichael Cooper, MD;  [facility];  Service: Cardiovascular;  Laterality: N/A;   Family History:  Family History  Problem Relation Age of Onset  . Diabetes Mother   . Diabetes Brother     Social History:  History  Alcohol Use  . Yes     History  Drug Use  . Yes  . Special: Cocaine    Social History   Social History  . Marital Status: Divorced    Spouse Name: N/A  . Number of Children: N/A  . Years of Education: N/A   Social History Main Topics  . Smoking status: Never Smoker   . Smokeless tobacco: Never Used  . Alcohol Use: Yes  . Drug Use: Yes    Special: Cocaine  . Sexual Activity: Not Asked   Other Topics Concern  . None   Social History Narrative   Additional Social History:   Sleep:  Partially improved   Appetite: fair   Current Medications: Current Facility-Administered Medications  Medication Dose Route Frequency Provider Last Rate Last Dose  . acetaminophen (TYLENOL) tablet 650 mg  650 mg Oral Q6H PRN Beau FannyJohn C Withrow, FNP   650 mg at 02/11/15 2052  . alum & mag hydroxide-simeth (MAALOX/MYLANTA) 200-200-20 MG/5ML suspension 30 mL  30 mL Oral Q4H PRN Beau FannyJohn C Withrow, FNP      . atorvastatin (LIPITOR) tablet 10 mg  10 mg Oral Daily Beau FannyJohn C Withrow, FNP   10  mg at 02/13/15 0757  . feeding supplement (GLUCERNA SHAKE) (GLUCERNA SHAKE) liquid 237 mL  237 mL Oral TID BM Craige Cotta, MD   237 mL at 02/12/15 2124  . guaiFENesin (MUCINEX) 12 hr tablet 1,200 mg  1,200 mg Oral BID Adonis Brook, NP   1,200 mg at 02/13/15 0757  . hydrALAZINE (APRESOLINE) tablet 25 mg  25 mg Oral 3 times per day Dorothea Ogle, MD      . hydrochlorothiazide (HYDRODIURIL) tablet 25 mg  25 mg Oral Daily Craige Cotta, MD   25 mg at 02/13/15 0757   And  . irbesartan (AVAPRO) tablet 300 mg  300 mg Oral Daily  Craige Cotta, MD   300 mg at 02/13/15 0757  . insulin aspart (novoLOG) injection 0-15 Units  0-15 Units Subcutaneous TID WC Sanjuana Kava, NP   2 Units at 02/12/15 1701  . insulin aspart (novoLOG) injection 4 Units  4 Units Subcutaneous TID WC Sanjuana Kava, NP   4 Units at 02/13/15 [phone]  . magnesium hydroxide (MILK OF MAGNESIA) suspension 30 mL  30 mL Oral Daily PRN Beau Fanny, FNP      . metFORMIN (GLUCOPHAGE-XR) 24 hr tablet 500 mg  500 mg Oral Q breakfast Beau Fanny, FNP   500 mg at 02/13/15 0758  . sertraline (ZOLOFT) tablet 50 mg  50 mg Oral Daily Craige Cotta, MD      . traZODone (DESYREL) tablet 50 mg  50 mg Oral QHS PRN Adonis Brook, NP   50 mg at 02/12/15 2124    Lab Results:  Results for orders placed or performed during the hospital encounter of 02/05/15 (from the past 48 hour(s))  Glucose, capillary     Status: Abnormal   Collection Time: 02/11/15 12:01 PM  Result Value Ref Range   Glucose-Capillary 105 (H) 65 - 99 mg/dL   Comment 1 Notify RN    Comment 2 Document in Chart   Glucose, capillary     Status: Abnormal   Collection Time: 02/11/15  5:05 PM  Result Value Ref Range   Glucose-Capillary 100 (H) 65 - 99 mg/dL   Comment 1 Notify RN    Comment 2 Document in Chart   Glucose, capillary     Status: Abnormal   Collection Time: 02/11/15  8:40 PM  Result Value Ref Range   Glucose-Capillary 120 (H) 65 - 99 mg/dL  Glucose, capillary     Status: Abnormal   Collection Time: 02/12/15  5:51 AM  Result Value Ref Range   Glucose-Capillary 109 (H) 65 - 99 mg/dL  Glucose, capillary     Status: Abnormal   Collection Time: 02/12/15  4:23 PM  Result Value Ref Range   Glucose-Capillary 130 (H) 65 - 99 mg/dL   Comment 1 Notify RN   Glucose, capillary     Status: Abnormal   Collection Time: 02/13/15  5:58 AM  Result Value Ref Range   Glucose-Capillary 101 (H) 65 - 99 mg/dL    Physical Findings: AIMS: Facial and Oral Movements Muscles of Facial Expression:  None, normal Lips and Perioral Area: None, normal Jaw: None, normal Tongue: None, normal,Extremity Movements Upper (arms, wrists, hands, fingers): None, normal Lower (legs, knees, ankles, toes): None, normal, Trunk Movements Neck, shoulders, hips: None, normal, Overall Severity Severity of abnormal movements (highest score from questions above): None, normal Incapacitation due to abnormal movements: None, normal Patient's awareness of abnormal movements (rate only patient's report): No Awareness, Dental  Status Current problems with teeth and/or dentures?: No Does patient usually wear dentures?: No  CIWA:  CIWA-Ar Total: 1 COWS:  COWS Total Score: 2  Musculoskeletal: Strength & Muscle Tone: within normal limits Gait & Station: normal Patient leans: N/A  Psychiatric Specialty Exam: Review of Systems  Respiratory: Positive for cough.   All other systems reviewed and are negative.  Reports intermittent mild headaches , no chest pain, no shortness of breath, no vomiting  Blood pressure 166/90, pulse 80, temperature 97.8 F (36.6 C), temperature source Oral, resp. rate 20, height 5' 9.5" (1.765 m), weight 281 lb (127.461 kg), SpO2 96 %.Body mass index is 40.92 kg/(m^2).  General Appearance:  Improved grooming   Eye Contact::   Good   Speech:  Normal Rate-   Volume:  Normal  Mood:  Improving , less depressed   Affect:  More reactive, still anxious about stressors   Thought Process:  Linear  Orientation:  Other:  fully alert and attentive   Thought Content:  denies hallucinations, no delusions    Suicidal Thoughts:  Denies suicidal ideations, denies self injurious ideations  Homicidal Thoughts:  No  Memory:  recent and remote grossly intact   Judgement:  Other:  improved   Insight:  Fair  Psychomotor Activity:  Normal  Concentration:  Good  Recall:  Good  Fund of Knowledge:Good  Language: Good  Akathisia:  Negative  Handed:  Right  AIMS (if indicated):     Assets:   Communication Skills Desire for Improvement Resilience  ADL's:  Intact  Cognition: WNL  Sleep:  Number of Hours: 6.75  Assessment - patient continues to improve gradually and mood is improved compared to admission, with a fuller range of affect. He does remain anxious, particularly about his legal issues- he is aware that he has charges pending and that he will be discharged to police custody- he does remain hopeful and future oriented . Patient has a history of HTN, and BP readings have remained high in spite of HCTZ/Irbersartan. Hospitalist has added Hydralazine, and today BP, although still elevated, somewhat improved. Due to Wellbutrin XL 's potential for increased BP/ tachycardia, we have decided to stop this medication . Patient agrees to SSRI trial, and we discussed starting Zoloft- side effects discussed. Will continue Trazodone PRNs for sleep- patient aware that this medication can contribute to more vivid dreams.    Treatment Plan Summary: Daily contact with patient to assess and evaluate symptoms and progress in treatment, Medication management, Plan inpatient admission and medications as below  Encourage ongoing milieu, group participation to work on coping skills and symptom reduction D/C  Wellbutrin XL Start Zoloft 50 mgrs QDAY for depression and anxiety. Continue Glucophage/insulin for DM management Continue HCTZ/ Irbesartan for management of HTN Now also on Hydralazine for HTN as per Hospitalist consult . Continue Trazodone 50 mgrs QHS PRN for insomnia as needed.   Nehemiah Massed  MD 02/13/2015, 11:56 AM

## 2015-02-13 NOTE — Progress Notes (Signed)
Adult Psychoeducational Group Note  Date:  02/13/2015 Time:  2:52 AM  Group Topic/Focus:  Wrap-Up Group:   The focus of this group is to help patients review their daily goal of treatment and discuss progress on daily workbooks.  Participation Level:  Active  Participation Quality:  Appropriate  Affect:  Appropriate  Cognitive:  Alert and Appropriate  Insight: Good  Engagement in Group:  Engaged  Modes of Intervention:  Activity  Additional Comments:  Patient rated his day a 9. Goal is to work on his self.  Claria DiceKiara M Ruford Dudzinski 02/13/2015, 2:52 AM

## 2015-02-13 NOTE — Progress Notes (Signed)
D: Patient alert and oriented x 4. Patient denies pain/SI/HI/AVH. Patient states, "I was to leave today but the doctor started me on some blood pressure medication today and want to see how it does." This Clinical research associatewriter educated patient on medication and side effects of dizzy ness when standing or getting up quickly. Patient voiced understanding.   A: Staff to monitor Q 15 mins for safety. Encouragement and support offered. Scheduled medications administered per orders. R: Patient remains safe on the unit. Patient attended group tonight. Patient visible on the unit and interacting with peers. Patient taking administered medications.

## 2015-02-13 NOTE — Progress Notes (Signed)
B/P rechecked 158/95. Renata Capriceonrad NP., notified. No new orders given. Continue to monitor pt.

## 2015-02-13 NOTE — Progress Notes (Signed)
D: Pt affect flat. Pt rates depression 3/10. Hopeless 3/10. Pt denies suicidal thoughts. Pt stated goal "try harder to work on change and challenges ahead of me, to keep moving forward without getting stuck". Pt compliant with taking meds and attending groups. Pt verbalized concerns about elevated B/P. Dr. Jama Flavorsobos, made aware. Writer will continue to monitor pt b/p. A: Medications administered as ordered per MD. Verbal support given. Pt encouraged to attend groups. 15 minute checks. R: Pt receptive to tx.

## 2015-02-13 NOTE — [facility] Group Notes (Signed)
[facility] LCSW Group Therapy 02/13/2015 1:15 PM  Type of Therapy: Group Therapy- Feelings about Diagnosis  Participation Level: Minimal  Participation Quality:  Reserved but Attentive  Affect:  Flat  Cognitive: Alert and Oriented   Insight:  Developing   Engagement in Therapy: Limited  Modes of Intervention: Clarification, Confrontation, Discussion, Education, Exploration, Limit-setting, Orientation, Problem-solving, Rapport Building, Dance movement psychotherapisteality Testing, Socialization and Support  Description of Group:   This group will allow patients to explore their thoughts and feelings about diagnoses they have received. Patients will be guided to explore their level of understanding and acceptance of these diagnoses. Facilitator will encourage patients to process their thoughts and feelings about the reactions of others to their diagnosis, and will guide patients in identifying ways to discuss their diagnosis with significant others in their lives. This group will be process-oriented, with patients participating in exploration of their own experiences as well as giving and receiving support and challenge from other group members.  Summary of Progress/Problems:  Pt did not participate in group discussion.  Therapeutic Modalities:   Cognitive Behavioral Therapy Solution Focused Therapy Motivational Interviewing Relapse Prevention Therapy  Chad CordialLauren Carter, LCSWA 02/13/2015 3:19 PM

## 2015-02-13 NOTE — Progress Notes (Signed)
Pt b/p at 1400 216/128 using Dinamap and 200/106 manually. Writer administered scheduled hydralazine 25 mg. Pt reports mild headache. Pt denies chest pain and weakness. Writer spoke with Renata Capriceonrad, NP. Conrad ordered for this Clinical research associatewriter to continue to monitor pt and recheck b/p in hour.

## 2015-02-13 NOTE — Progress Notes (Signed)
BP still elevated, will increase the dose of Hydralazine to 25 mg PO TID.   Debbora PrestoMAGICK-Audine Mangione, MD  [facility] Pager [phone]  If 7PM-7AM, please contact night-coverage [email or web address] Password [email or web address]

## 2015-02-13 NOTE — Progress Notes (Signed)

## 2015-02-14 ENCOUNTER — Inpatient Hospital Stay (HOSPITAL_COMMUNITY): Payer: Self-pay

## 2015-02-14 ENCOUNTER — Inpatient Hospital Stay (HOSPITAL_COMMUNITY)
Admission: EM | Admit: 2015-02-14 | Discharge: 2015-02-16 | DRG: 305 | Disposition: A | Discharge status: Home / Self Care | Payer: Self-pay | Attending: Internal Medicine | Admitting: Internal Medicine

## 2015-02-14 ENCOUNTER — Encounter (HOSPITAL_COMMUNITY): Payer: Self-pay | Admitting: Emergency Medicine

## 2015-02-14 DIAGNOSIS — Z590 Homelessness: Secondary | ICD-10-CM

## 2015-02-14 DIAGNOSIS — E119 Type 2 diabetes mellitus without complications: Secondary | ICD-10-CM | POA: Diagnosis present

## 2015-02-14 DIAGNOSIS — Z9114 Patient's other noncompliance with medication regimen: Secondary | ICD-10-CM

## 2015-02-14 DIAGNOSIS — I160 Hypertensive urgency: Principal | ICD-10-CM | POA: Diagnosis present

## 2015-02-14 DIAGNOSIS — I200 Unstable angina: Secondary | ICD-10-CM | POA: Diagnosis present

## 2015-02-14 DIAGNOSIS — Z79899 Other long term (current) drug therapy: Secondary | ICD-10-CM

## 2015-02-14 DIAGNOSIS — F332 Major depressive disorder, recurrent severe without psychotic features: Secondary | ICD-10-CM | POA: Diagnosis present

## 2015-02-14 DIAGNOSIS — Z7984 Long term (current) use of oral hypoglycemic drugs: Secondary | ICD-10-CM

## 2015-02-14 DIAGNOSIS — R51 Headache: Secondary | ICD-10-CM | POA: Diagnosis present

## 2015-02-14 DIAGNOSIS — I10 Essential (primary) hypertension: Secondary | ICD-10-CM | POA: Diagnosis present

## 2015-02-14 DIAGNOSIS — H538 Other visual disturbances: Secondary | ICD-10-CM | POA: Insufficient documentation

## 2015-02-14 DIAGNOSIS — R519 Headache, unspecified: Secondary | ICD-10-CM

## 2015-02-14 LAB — COMPREHENSIVE METABOLIC PANEL
ALK PHOS: 84 U/L (ref 38–126)
ALT: 32 U/L (ref 17–63)
ANION GAP: 12 (ref 5–15)
AST: 24 U/L (ref 15–41)
Albumin: 3.9 g/dL (ref 3.5–5.0)
BILIRUBIN TOTAL: 0.6 mg/dL (ref 0.3–1.2)
BUN: 13 mg/dL (ref 6–20)
CALCIUM: 9.7 mg/dL (ref 8.9–10.3)
CO2: 30 mmol/L (ref 22–32)
Chloride: 100 mmol/L — ABNORMAL LOW (ref 101–111)
Creatinine, Ser: 1.06 mg/dL (ref 0.61–1.24)
GFR calc non Af Amer: >60 mL/min (ref >60)
Glucose, Bld: 100 mg/dL — ABNORMAL HIGH (ref 65–99)
Potassium: 3.8 mmol/L (ref 3.5–5.1)
SODIUM: 142 mmol/L (ref 135–145)
TOTAL PROTEIN: 7.4 g/dL (ref 6.5–8.1)

## 2015-02-14 LAB — CBC
HCT: 43.5 % (ref 39.0–52.0)
HEMATOCRIT: 44 % (ref 39.0–52.0)
HEMOGLOBIN: 14.1 g/dL (ref 13.0–17.0)
HEMOGLOBIN: 14 g/dL (ref 13.0–17.0)
MCH: 29.3 pg (ref 26.0–34.0)
MCH: 29.5 pg (ref 26.0–34.0)
MCHC: 32 g/dL (ref 30.0–36.0)
MCHC: 32.2 g/dL (ref 30.0–36.0)
MCV: 91.5 fL (ref 78.0–100.0)
MCV: 91.6 fL (ref 78.0–100.0)
Platelets: 219 10*3/uL (ref 150–400)
Platelets: 205 10*3/uL (ref 150–400)
RBC: 4.81 MIL/uL (ref 4.22–5.81)
RBC: 4.75 MIL/uL (ref 4.22–5.81)
RDW: 13.3 % (ref 11.5–15.5)
RDW: 13.3 % (ref 11.5–15.5)
WBC: 10 10*3/uL (ref 4.0–10.5)
WBC: 10.9 10*3/uL — ABNORMAL HIGH (ref 4.0–10.5)

## 2015-02-14 LAB — GLUCOSE, CAPILLARY
GLUCOSE-CAPILLARY: 109 mg/dL — AB (ref 65–99)
GLUCOSE-CAPILLARY: 92 mg/dL (ref 65–99)
Glucose-Capillary: 96 mg/dL (ref 65–99)
Glucose-Capillary: 110 mg/dL — ABNORMAL HIGH (ref 65–99)

## 2015-02-14 LAB — CREATININE, SERUM
CREATININE: 1.06 mg/dL (ref 0.61–1.24)
GFR calc Af Amer: >60 mL/min (ref >60)
GFR calc non Af Amer: >60 mL/min (ref >60)

## 2015-02-14 LAB — TSH: TSH: 1.262 u[IU]/mL (ref 0.350–4.500)

## 2015-02-14 LAB — TROPONIN I

## 2015-02-14 MED ORDER — METFORMIN HCL ER 500 MG PO TB24
500.0000 mg | ORAL_TABLET | Freq: Every day | ORAL | Status: DC
  Administered 2015-02-15 – 2015-02-16 (×2): 500 mg via ORAL
  Filled 2015-02-14 (×4): qty 1

## 2015-02-14 MED ORDER — CARVEDILOL 12.5 MG PO TABS
12.5000 mg | ORAL_TABLET | Freq: Two times a day (BID) | ORAL | Status: DC
  Filled 2015-02-14 (×2): qty 1

## 2015-02-14 MED ORDER — TRAZODONE HCL 50 MG PO TABS: 50.0000 mg | ORAL_TABLET | Freq: Every evening | ORAL | Status: DC | PRN

## 2015-02-14 MED ORDER — ONDANSETRON HCL 4 MG/2ML IJ SOLN: 4.0000 mg | Freq: Four times a day (QID) | INTRAMUSCULAR | Status: DC | PRN

## 2015-02-14 MED ORDER — ASPIRIN-ACETAMINOPHEN-CAFFEINE 250-250-65 MG PO TABS
2.0000 | ORAL_TABLET | Freq: Four times a day (QID) | ORAL | Status: DC | PRN
  Administered 2015-02-15 (×2): 2 via ORAL
  Filled 2015-02-14 (×3): qty 2

## 2015-02-14 MED ORDER — NITROGLYCERIN IN D5W 200-5 MCG/ML-% IV SOLN
0.0000 ug/min | INTRAVENOUS | Status: DC
  Administered 2015-02-14: 5 ug/min via INTRAVENOUS
  Administered 2015-02-15: 45 ug/min via INTRAVENOUS
  Administered 2015-02-15: 60 ug/min via INTRAVENOUS
  Administered 2015-02-15: 35 ug/min via INTRAVENOUS
  Administered 2015-02-15: 50 ug/min via INTRAVENOUS
  Administered 2015-02-15: 40 ug/min via INTRAVENOUS
  Filled 2015-02-14 (×2): qty 250

## 2015-02-14 MED ORDER — ATORVASTATIN CALCIUM 10 MG PO TABS
10.0000 mg | ORAL_TABLET | Freq: Every day | ORAL | Status: DC
  Administered 2015-02-15 – 2015-02-16 (×2): 10 mg via ORAL
  Filled 2015-02-14 (×2): qty 1

## 2015-02-14 MED ORDER — ONDANSETRON HCL 4 MG PO TABS: 4.0000 mg | ORAL_TABLET | Freq: Four times a day (QID) | ORAL | Status: DC | PRN

## 2015-02-14 MED ORDER — ENOXAPARIN SODIUM 60 MG/0.6ML ~~LOC~~ SOLN
60.0000 mg | SUBCUTANEOUS | Status: DC
  Administered 2015-02-15 (×2): 60 mg via SUBCUTANEOUS
  Filled 2015-02-14 (×3): qty 0.6

## 2015-02-14 MED ORDER — VALSARTAN-HYDROCHLOROTHIAZIDE 320-25 MG PO TABS: 1.0000 | ORAL_TABLET | Freq: Every day | ORAL | Status: DC

## 2015-02-14 MED ORDER — POTASSIUM CHLORIDE ER 20 MEQ PO TBCR: 20.0000 meq | EXTENDED_RELEASE_TABLET | Freq: Every day | ORAL | Status: DC

## 2015-02-14 MED ORDER — HYDRALAZINE HCL 25 MG PO TABS
25.0000 mg | ORAL_TABLET | Freq: Three times a day (TID) | ORAL | Status: DC
  Administered 2015-02-15: 25 mg via ORAL
  Filled 2015-02-14 (×4): qty 1

## 2015-02-14 MED ORDER — GUAIFENESIN ER 600 MG PO TB12
1200.0000 mg | ORAL_TABLET | Freq: Two times a day (BID) | ORAL | Status: DC
  Administered 2015-02-15 – 2015-02-16 (×4): 1200 mg via ORAL
  Filled 2015-02-14 (×5): qty 2

## 2015-02-14 MED ORDER — IRBESARTAN 300 MG PO TABS
300.0000 mg | ORAL_TABLET | Freq: Every day | ORAL | Status: DC
  Administered 2015-02-15 – 2015-02-16 (×2): 300 mg via ORAL
  Filled 2015-02-14 (×2): qty 1

## 2015-02-14 MED ORDER — BUPROPION HCL ER (XL) 300 MG PO TB24
300.0000 mg | ORAL_TABLET | Freq: Every day | ORAL | Status: DC
  Filled 2015-02-14: qty 1

## 2015-02-14 MED ORDER — HYDROCHLOROTHIAZIDE 25 MG PO TABS
25.0000 mg | ORAL_TABLET | Freq: Every day | ORAL | Status: DC
  Administered 2015-02-15 – 2015-02-16 (×2): 25 mg via ORAL
  Filled 2015-02-14 (×2): qty 1

## 2015-02-14 MED ORDER — POTASSIUM CHLORIDE CRYS ER 20 MEQ PO TBCR
20.0000 meq | EXTENDED_RELEASE_TABLET | Freq: Every day | ORAL | Status: DC
  Administered 2015-02-15 – 2015-02-16 (×2): 20 meq via ORAL
  Filled 2015-02-14 (×2): qty 1

## 2015-02-14 NOTE — Progress Notes (Signed)
Adult Psychoeducational Group Note  Date:  02/14/2015 Time:  12:29 AM  Group Topic/Focus:  Wrap-Up Group:   The focus of this group is to help patients review their daily goal of treatment and discuss progress on daily workbooks.  Participation Level:  Active  Participation Quality:  Appropriate  Affect:  Appropriate  Cognitive:  Appropriate  Insight: Good  Engagement in Group:  Engaged  Modes of Intervention:  Activity  Additional Comments:  Patient rated his day a 5. Stated he is working on getting his medication right and to get his blood pressure down.  Claria DiceKiara M Flor Houdeshell 02/14/2015, 12:29 AM

## 2015-02-14 NOTE — Progress Notes (Signed)
Per Julieanne Cottonina, AC, [place] [employer] Watch Commander notified of Pt[facility].   Chad CordialLauren Carter, LCSWA Clinical Social Work [phone]

## 2015-02-14 NOTE — ED Notes (Signed)
Bed: NG29WA19 Expected date:  Expected time:  Means of arrival:  Comments: [facility]

## 2015-02-14 NOTE — Progress Notes (Signed)
D: Patient is safe on the unit. [name] states he has been feeling tired most of the day. Rates Anxiety 4/10. He did attend group this evening. He remains markedly hypertensive. He endorses non compliance with his antihypertensive medications prior to admission due to erectile dysfunction. We discussed the importance of maintaining cardiac health and discussing his ED with his cardiologist for optional treatment.  A: Q 15 minute checks for patient safety continue. Encouragement and support given. Medications administered as prescribed.  R: Continue to monitor for patient safety and medication effectiveness.

## 2015-02-14 NOTE — BH Assessment (Signed)
Telephoned Lt. Dellie BurnsMardis, Watch Floraommander [phone] with [employer] r/t Order to Disclose. Notified Lt. Mardis [facility].

## 2015-02-14 NOTE — ED Notes (Signed)
[facility] r/t to HTN.

## 2015-02-14 NOTE — [facility] Group Notes (Signed)
[facility]  02/14/2015 8:45 AM  Participation Quality: Alert, Appropriate and Oriented  Mood/Affect: Flat  Depression Rating: 2  Anxiety Rating: 2  Thoughts of Suicide: Pt denies SI/HI  Will you contract for safety? Yes  Current AVH: Pt denies  Plan for Discharge/Comments: Pt attended discharge planning group and actively participated in group. CSW discussed suicide prevention education with the group and encouraged them to discuss discharge planning and any relevant barriers. Pt reports that he is symptomatic due to elevated blood pressure; endorses symptoms of blurred vision, pain behind his eyes, and feeling "woozy."  Transportation Means: Pt reports access to transportation  Supports: No supports mentioned at this time  Chad CordialLauren Carter, LCSWA 02/14/2015 9:32 AM

## 2015-02-14 NOTE — Clinical Social Work Note (Signed)
Clinical Social Work Assessment  Patient Details  Name: Jeffrey Marquez MRN: [id number] Date of Birth: 06-06-1960  Date of referral:  02/14/15               Reason for consult:   (Homeless)                Permission sought to share information with:   (None.) Permission granted to share information::  No  Name::        Agency::     Relationship::     Contact Information:     Housing/Transportation Living arrangements for the past 2 months:  Homeless Source of Information:  Patient Patient Interpreter Needed:  None Criminal Activity/Legal Involvement Pertinent to Current Situation/Hospitalization:    Significant Relationships:  Adult Children, Friend (Patient states that he does not have any support in [place]. Patient states his family and friends are in [place].) Lives with:    Do you feel safe going back to the place where you live?   (The patient is homeless.) Need for family participation in patient care:   (Patient informed CSW that he does not have a support system.)  Care giving concerns:  There are no care giving concerns at this time.    Social Worker assessment / plan:  CSW met with patient at bedside. There was no family present. Patient presents to [facility] with hypertension.   Patient confirms that he is homeless. He states that he has been homeless for the past 2 weeks.Patient states that he was sleeping in the ICU waiting area at [facility] for the past 3 days. Prior to sleeping in the waiting area at [facility]; patient states that he was living with his now ex-girlfriend in [place] but was kicked out. Patient states that he is not welcomed to come back to her home.   Patient informed CW that he does not feel as though he has a support system in [place]. Patient states that his family and friends are in [place].   Patient states that he is employed. According to patient, he works part time at [employer].   Patient informed CSW that he has been dx with  depression in the past. Patient denies having issues with substance abuse. However, he states that on New Years eve he used cocaine and consumed alcohol.  Employment status:  Part-Time (Patient states that he works part time at [employer]. ) [employer]:   (None Listed.) PT Recommendations:  Not assessed at this time Information / Referral to community resources:   (CSW provided patieint with a list of local homeless shelters and food pantires.)  Patient/Family's Response to care:  Patient is appropriate at this time. Patient is accepting of admission.  Patient/Family's Understanding of and Emotional Response to Diagnosis, Current Treatment, and Prognosis:  Patient states that he has no questions for CSW at this time.  Emotional Assessment Appearance:  Appears stated age Attitude/Demeanor/Rapport:   (Appropriate.) Affect (typically observed):  Appropriate, Accepting Orientation:  Oriented to Self, Oriented to Place, Oriented to  Time, Oriented to Situation Alcohol / Substance use:  Not Applicable Psych involvement (Current and /or in the community):  No (Comment)  Discharge Needs  Concerns to be addressed:  Homelessness (CSW provided patient with resources regateding food and shelter.) Readmission within the last 30 days:  Yes Current discharge risk:  Homeless Barriers to Discharge:  No Barriers Identified   Bernita Buffy, LCSW 02/14/2015, 7:16 PM

## 2015-02-14 NOTE — Progress Notes (Signed)
EDCM spoke to patient at bedside. Patient confirms he does [place] have a pcp or [place]  [facility], informed patient of services there.  EDCM also provided patient with list of pcps who accept self pay patients, list of discount pharmacies and websites needymeds.org and GoodRX.com for medication assistance, phone number to inquire about the orange card, phone number to inquire about Mediciad, phone number to inquire about the Affordable Care Act, financial resources in the community such as local churches, salvation army, urban ministries, and dental assistance for uninsured patients.  Patient thankful for resources.  No further EDCM needs at this time.    Patient listed as having Dr. Holland CommonsValerie Keck as his pcp.  Patient reports he has [facility] "[facility]."  Patient [facility].  EDCM will email [facility] in attempts to obtain an appointment.  Patient reports [facility]  Patient reports he sees [facility] Melburn [facility]her [facility].  Per chart review, patient has an appointment on 02/24 at Dr. Sallee ProvencalNasher's office for blood work and a three month follow up scheduled on 04/03/15 at 0800am at Dr. Sallee ProvencalNasher's office.  EDCM requested phone call to his daughter Fredonia Highlandamika [phone] to update her that he is now in the hospital.  [facility] will try to contact patient's daughter.   EDCM attempted to call patient's daughter without success, phone continued ringing.

## 2015-02-14 NOTE — ED Provider Notes (Signed)
CSN: [id number]     Arrival date & time 02/14/15  1446 History   First MD Initiated Contact with Patient 02/14/15 1519     Chief Complaint  Patient presents with  . Hypertension     (Consider location/radiation/quality/duration/timing/severity/associated sxs/prior Treatment) HPI Comments: 55 year old male with history of diabetes mellitus, hypertension, major depressive disorder presents from behavioral health for uncontrolled hypertension. The patient has been admitted for several days over at behavioral health for treatment of his mental health disorders. He has been noted to have continued hypertension mahi's there. His medications have been adjusted. Internal medicine was consult treated. Because the patient continued to have uncontrolled hypertension despite medication adjustments the decision was made that he needed inpatient treatment. There were no beds available for direct admission and so the patient was sent through the emergency department. The patient reports left-sided headache as well as blurry vision which she said are similar symptoms that he has had previously when his blood pressures been uncontrolled. Denies any nausea, vomiting, chest pain, shortness of breath. No numbness or tingling. No weakness.  Patient is a 55 y.o. male presenting with hypertension.  Hypertension Associated symptoms include headaches. Pertinent negatives include no chest pain, no abdominal pain and no shortness of breath.    Past Medical History  Diagnosis Date  . Diverticulitis   . Hypertension   . Diverticula of colon 2012  . Diabetes mellitus without complication (HCC)   . Obesity   . Drug abuse   . Alcohol abuse    Past Surgical History  Procedure Laterality Date  . Cardiac catheterization N/A 10/18/2014    Procedure: Left Heart Cath and Coronary Angiography;  Surgeon: Tonny Bollman, MD;  Location[facility] INVASIVE CV LAB;  Service: Cardiovascular;  Laterality: N/A;   Family History  Problem  Relation Age of Onset  . Diabetes Mother   . Diabetes Brother    Social History  Substance Use Topics  . Smoking status: Never Smoker   . Smokeless tobacco: Never Used  . Alcohol Use: Yes    Review of Systems  Constitutional: Negative for fever, chills and fatigue.  HENT: Negative for congestion, postnasal drip, rhinorrhea and sinus pressure.   Eyes: Positive for visual disturbance. Negative for photophobia, pain and redness.  Respiratory: Negative for cough, chest tightness and shortness of breath.   Cardiovascular: Negative for chest pain and palpitations.  Gastrointestinal: Negative for nausea, abdominal pain and diarrhea.  Genitourinary: Negative for dysuria, urgency and frequency.  Musculoskeletal: Negative for myalgias and back pain.  Skin: Negative for rash.  Neurological: Positive for headaches. Negative for dizziness, speech difficulty, weakness and numbness.  Hematological: Does not bruise/bleed easily.      Allergies  Review of patient's allergies indicates no known allergies.  Home Medications   Prior to Admission medications   Medication Sig Start Date End Date Taking? Authorizing Provider  aspirin-acetaminophen-caffeine (EXCEDRIN MIGRAINE) [phone] MG per tablet Take 2 tablets by mouth every 6 (six) hours as needed for headache.    Historical Provider, MD  atorvastatin (LIPITOR) 10 MG tablet Take 1 tablet (10 mg total) by mouth daily. 02/12/15   Thermon Leyland, NP  buPROPion (WELLBUTRIN XL) 300 MG 24 hr tablet Take 1 tablet (300 mg total) by mouth daily. 02/12/15   Thermon Leyland, NP  guaiFENesin (MUCINEX) 600 MG 12 hr tablet Take 2 tablets (1,200 mg total) by mouth 2 (two) times daily. May purchase over the counter as desired. 02/12/15   Thermon Leyland, NP  metFORMIN (  GLUCOPHAGE XR) 500 MG 24 hr tablet Take 1 tablet (500 mg total) by mouth daily with breakfast. 02/12/15   Thermon LeylandLaura A Davis, NP  nitroGLYCERIN (NITROSTAT) 0.4 MG SL tablet Place 1 tablet (0.4 mg total) under the  tongue every 5 (five) minutes as needed for chest pain. Patient not taking: Reported on 02/04/2015 10/18/14   Vesta MixerPhilip J Nahser, MD  Potassium Chloride ER 20 MEQ TBCR Take 20 mEq by mouth daily. 02/12/15   Thermon LeylandLaura A Davis, NP  traZODone (DESYREL) 50 MG tablet Take 1 tablet (50 mg total) by mouth at bedtime as needed for sleep. 02/12/15   Thermon LeylandLaura A Davis, NP  valsartan-hydrochlorothiazide (DIOVAN-HCT) 320-25 MG tablet Take 1 tablet by mouth daily. 02/12/15   Thermon LeylandLaura A Davis, NP   There were no vitals taken for this visit. Physical Exam  Constitutional: He is oriented to person, place, and time. He appears well-developed and well-nourished. No distress.  HENT:  Head: Normocephalic and atraumatic.  Right Ear: External ear normal.  Left Ear: External ear normal.  Mouth/Throat: Oropharynx is clear and moist. No oropharyngeal exudate.  Eyes: EOM are normal. Pupils are equal, round, and reactive to light.  Neck: Normal range of motion. Neck supple.  Cardiovascular: Normal rate, regular rhythm, normal heart sounds and intact distal pulses.   No murmur heard. Pulmonary/Chest: Effort normal. No respiratory distress. He has no wheezes. He has no rales.  Abdominal: Soft. He exhibits no distension. There is no tenderness.  Musculoskeletal: He exhibits no edema.  Neurological: He is alert and oriented to person, place, and time. He has normal strength. No cranial nerve deficit or sensory deficit. He exhibits normal muscle tone. Coordination normal.  Skin: Skin is warm and dry. No rash noted. He is not diaphoretic.  Vitals reviewed.   ED Course  Procedures (including critical care time) Labs Review Labs Reviewed  CBC  COMPREHENSIVE METABOLIC PANEL    Imaging Review No results found. I have personally reviewed and evaluated these images and lab results as part of my medical decision-making.   EKG Interpretation None      MDM  Patient was seen and evaluated in stable condition.  CBC and CMP were ordered.   Dr. Izola PriceMyers from internal medicine came by to see the patient and admitted him under her care.  Patient was admitted in stable condition for continued treatment of his uncontrolled HTN.   Final diagnoses:  Hypertensive urgency    1. Hypertensive urgency    Leta BaptistEmily Roe Nguyen, MD 02/14/15 1630

## 2015-02-14 NOTE — H&P (Signed)
[facility] History and Physical  Jeffrey Marquez ZHY:[id number] DOB: 1960-02-23 DOA: 02/14/2015  Referring physician: Dr. Jama Flavors  PCP: Ambrose Finland, NP   Chief Complaint: uncontrolled HTN  HPI:  Pt is 55 yo male with depression and HTN, DM, alcohol and drug use, has been at [facility] for evaluation and [facility] called earlier to evaluate HTN regimen. Pt is rather poor historian with flat affect and only reports blurry vision, no chest pain or dyspnea, no focal neurological deficits. With SBP in 200's, my recommendation was to admit pt to SDU for acute hypertensive urgency, Dr. Jama Flavors agreed. BP upon arrival to the ED was as high as 216/128 and pt still with concern of visual changes. CT head requested as well CMET and CBC. PT admitted by [facility] for further evaluation.   Assessment and Plan: Active Problems: Acute hypertensive urgency - admit to SDU - continue home regimen with Hydralazine, Coreg, HCTZ - Avapro - placed on Nitro drip to get better blood pressure control and titrate to off as possible - due to nitro drip requirement, will needed SDU admission as noted above - check CMET and CBC, CT head   DM type II - continue Metformin - check A1C  Depression - daily assessment and call psych once pt more medically stable and can be transferred back to [facility]  Lovenox SQ for DVT prophylaxis   Radiological Exams on Admission: No results found.   Code Status: Full Family Communication: Pt at bedside Disposition Plan: Admit for further evaluation    Danie Binder [facility] [phone]   Review of Systems:  Constitutional: Negative for fever, chills and malaise/fatigue. Negative for diaphoresis.  HENT: Negative for hearing loss, ear pain, nosebleeds, congestion, sore throat, neck pain, tinnitus and ear discharge.   Eyes: Negative for blurred vision, double vision, photophobia, pain, discharge and redness.  Respiratory: Negative for cough, hemoptysis, sputum production, shortness of breath,  wheezing and stridor.   Cardiovascular: Negative for chest pain, palpitations, orthopnea, claudication and leg swelling.  Gastrointestinal: Negative for nausea, vomiting and abdominal pain.  Genitourinary: Negative for dysuria, urgency, frequency, hematuria and flank pain.  Musculoskeletal: Negative for myalgias, back pain, joint pain and falls.  Skin: Negative for itching and rash.  Neurological: Negative for dizziness and weakness.  Endo/Heme/Allergies: Negative for environmental allergies and polydipsia. Does not bruise/bleed easily.  Psychiatric/Behavioral: Negative for suicidal ideas. The patient is not nervous/anxious.      Past Medical History  Diagnosis Date  . Diverticulitis   . Hypertension   . Diverticula of colon 2012  . Diabetes mellitus without complication (HCC)   . Obesity   . Drug abuse   . Alcohol abuse     Past Surgical History  Procedure Laterality Date  . Cardiac catheterization N/A 10/18/2014    Procedure: Left Heart Cath and Coronary Angiography;  Surgeon: Tonny Bollman, MD;  Location: [facility] INVASIVE CV LAB;  Service: Cardiovascular;  Laterality: N/A;    Social History:  reports that he has never smoked. He has never used smokeless tobacco. He reports that he drinks alcohol. He reports that he uses illicit drugs (Cocaine).  No Known Allergies  Family History  Problem Relation Age of Onset  . Diabetes Mother   . Diabetes Brother     Prior to Admission medications   Medication Sig Start Date End Date Taking? Authorizing Provider  aspirin-acetaminophen-caffeine (EXCEDRIN MIGRAINE) [phone] MG per tablet Take 2 tablets by mouth every 6 (six) hours as needed for headache.    Historical Provider, MD  atorvastatin (LIPITOR) 10 MG tablet Take 1 tablet (10 mg total) by mouth daily. 02/12/15   Thermon LeylandLaura A Davis, NP  buPROPion (WELLBUTRIN XL) 300 MG 24 hr tablet Take 1 tablet (300 mg total) by mouth daily. 02/12/15   Thermon LeylandLaura A Davis, NP  metFORMIN (GLUCOPHAGE XR) 500 MG 24  hr tablet Take 1 tablet (500 mg total) by mouth daily with breakfast. 02/12/15   Thermon LeylandLaura A Davis, NP  traZODone (DESYREL) 50 MG tablet Take 1 tablet (50 mg total) by mouth at bedtime as needed for sleep. 02/12/15   Thermon LeylandLaura A Davis, NP  valsartan-hydrochlorothiazide (DIOVAN-HCT) 320-25 MG tablet Take 1 tablet by mouth daily. 02/12/15   Thermon LeylandLaura A Davis, NP    Physical Exam: There were no vitals filed for this visit.  Physical Exam  Constitutional: Appears well-developed and well-nourished. No distress.  HENT: Normocephalic. External right and left ear normal. Oropharynx is clear and moist.  Eyes: Conjunctivae and EOM are normal. PERRLA, no scleral icterus.  Neck: Normal ROM. Neck supple. No JVD. No tracheal deviation. No thyromegaly.  CVS: RRR, S1/S2 +, no murmurs, no gallops, no carotid bruit.  Pulmonary: Effort and breath sounds normal, no stridor, rhonchi, wheezes, rales.  Abdominal: Soft. BS +,  no distension, tenderness, rebound or guarding.  Musculoskeletal: Normal range of motion.  Lymphadenopathy: No lymphadenopathy noted, cervical, inguinal. Neuro: Alert. Normal reflexes, muscle tone coordination. No cranial nerve deficit. Skin: Skin is warm and dry. No rash noted. Not diaphoretic. No erythema. No pallor.  Psychiatric: Flat affect   Labs on Admission:  Basic Metabolic Panel: No results for input(s): NA, K, CL, CO2, GLUCOSE, BUN, CREATININE, CALCIUM, MG, PHOS in the last 168 hours. Liver Function Tests: No results for input(s): AST, ALT, ALKPHOS, BILITOT, PROT, ALBUMIN in the last 168 hours. No results for input(s): LIPASE, AMYLASE in the last 168 hours. No results for input(s): AMMONIA in the last 168 hours. CBC: No results for input(s): WBC, NEUTROABS, HGB, HCT, MCV, PLT in the last 168 hours. Cardiac Enzymes: No results for input(s): CKTOTAL, CKMB, CKMBINDEX, TROPONINI in the last 168 hours. BNP: Invalid input(s): POCBNP CBG:  Recent Labs Lab 02/13/15 1152 02/13/15 1658  02/13/15 2105 02/14/15 0613 02/14/15 1152  GLUCAP 89 147* 121* 110* 92    EKG: pending    If 7PM-7AM, please contact night-coverage [email or web address] Password [email or web address] 02/14/2015, 4:09 PM

## 2015-02-14 NOTE — [facility] Suicide Risk Assessment (Signed)
[facility]   Demographic Factors:  55 year old man  Total Time spent with patient: 30 minutes  Musculoskeletal: Strength & Muscle Tone: within normal limits Gait & Station: normal Patient leans: N/A  Psychiatric Specialty Exam: Physical Exam  ROS  Blood pressure 180/102, pulse 89, temperature 98.4 F (36.9 C), temperature source Oral, resp. rate 18, height 5' 9.5" (1.765 m), weight 281 lb (127.461 kg), SpO2 96 %.Body mass index is 40.92 kg/(m^2).  General Appearance: Fairly Groomed  Patent attorneyye Contact::  Good  Speech:  Normal Rate409  Volume:  Normal  Mood:  still vaguely depressed, but reporting mood is improved   Affect:  Appropriate  Thought Process:  Linear  Orientation:  Full (Time, Place, and Person)  Thought Content:  no hallucinations, no delusions   Suicidal Thoughts:  No- at this time denies suicidal or self injurious ideations  Homicidal Thoughts:  No  Memory:  recent and remote grossly intact   Judgement:   improved  Insight:  Present  Psychomotor Activity:  Normal  Concentration:  Good  Recall:  Good  Fund of Knowledge:Good  Language: Good  Akathisia:  Negative  Handed:  Right  AIMS (if indicated):     Assets:  Communication Skills Desire for Improvement Resilience  Sleep:  Number of Hours: 6.75  Cognition: WNL  ADL's:  Intact   Have you used any form of tobacco in the last 30 days? (Cigarettes, Smokeless Tobacco, Cigars, and/or Pipes): No  Has this patient used any form of tobacco in the last 30 days? (Cigarettes, Smokeless Tobacco, Cigars, and/or Pipes)  N/A  Mental Status Per Nursing Assessment::   On Admission:  Suicidal ideation indicated by patient, Suicide plan, Plan includes specific time, place, or method, Self-harm thoughts, Self-harm behaviors, Intention to act on suicide plan, Belief that plan would result in death  Current Mental Status by Physician: Patient currently presents alert, attentive, oriented x 3, mood is  improved, less depressed, affect is reactive, no thought disorder , no SI or HI, no psychotic symptoms  Loss Factors: Legal issues, recent break up   Historical Factors: Depression   Risk Reduction Factors:   Sense of responsibility to family and Positive coping skills or problem solving skills  Continued Clinical Symptoms:  As noted currently improved compared to admission- no SI, no HI, no psychotic symptoms  Cognitive Features That Contribute To Risk:  No gross cognitive deficits noted upon discharge. Is alert , attentive, and oriented x 3   Suicide Risk:  Mild   Principal Problem: MDD (major depressive disorder), recurrent severe, without psychosis (HCC) Discharge Diagnoses:  Patient Active Problem List   Diagnosis Date Noted  . MDD (major depressive disorder), recurrent severe, without psychosis (HCC) [F33.2] 02/05/2015  . Unstable angina (HCC) [I20.0] 10/18/2014  . Essential hypertension, malignant [I10] 01/10/2014  . DM II (diabetes mellitus, type II), controlled (HCC) [E11.9] 11/27/2013    Follow-up Information    [facility]   Specialty:  Behavioral Health   Why:  This is a walk-in appointment. Please go between 8am-3pm Monday-Friday to be seen for your hospital discharge appt and be established with a therapist and psychiatrist. It is suggested that you arrive early to avoid longer wait times   Contact information:   [address] [place] [address] [phone]       Follow up with [facility]. Schedule an appointment as soon as possible for a visit today.   Why:  If needed for follow up of chronic medical conditions as they accept patient's with no insurance    Contact information:   [address] [place] [address] [phone]      Plan Of Care/Follow-up recommendations:  Activity:  as per Hospitalist MD Diet:  as per Hospitalist MD  Tests:  NA Other:  See below   Is patient on  multiple antipsychotic therapies at discharge:  No   Has Patient had three or more failed trials of antipsychotic monotherapy by history:  No  Recommended Plan for Multiple Antipsychotic Therapies: NA  * Due to persistence of high blood pressure, in spite of recent antihypertensive med adjustments, and due to vague physical symptoms such as blurry vision, Hospitalist - Dr. Izola Price- has determined that patient will need inpatient medical care for management. Patient being discharged and sent to [facility]  ED for appropriate medical management.  Patient aware and in agreement   Jeffrey Marquez 02/14/2015, 1:33 PM

## 2015-02-14 NOTE — Progress Notes (Signed)
Report called to Riggin, Consulting civil engineerCharge RN. Per Cobos, MD pt is to be sent to ED for uncontrolled hypertension. Dr. Izola PriceMyers, initially wanted pt to be admitted to the telemetry floor. Per bed placement. No available beds. Pt will need to go in through ED. Dr. Jama Flavorsobos contacted Dr. Izola PriceMyers and agreed for pt to be sent to ED. [facility]  Writer explained to [employer] Mosaic CompanyLindsay, Consulting civil engineerCharge RN., that there is an order for [facility] commander to be contacted per Jackquline Denmarkon Causey, Head of security when pt d/c from ED or medical floor. Order sent over with EMS.   Pt current b/p 190/100 sitting and standing 189/103. Pt reports mild headache and blurred vision. Hydralazine 25 mg given 1351.  Pt belongings transported with pt to ED by EMS.

## 2015-02-15 ENCOUNTER — Inpatient Hospital Stay (HOSPITAL_COMMUNITY): Payer: Self-pay

## 2015-02-15 DIAGNOSIS — I160 Hypertensive urgency: Principal | ICD-10-CM

## 2015-02-15 DIAGNOSIS — F329 Major depressive disorder, single episode, unspecified: Secondary | ICD-10-CM

## 2015-02-15 DIAGNOSIS — E118 Type 2 diabetes mellitus with unspecified complications: Secondary | ICD-10-CM

## 2015-02-15 LAB — HEMOGLOBIN A1C
HEMOGLOBIN A1C: 6.2 % — AB (ref 4.8–5.6)
MEAN PLASMA GLUCOSE: 131 mg/dL

## 2015-02-15 LAB — CBC
HCT: 41 % (ref 39.0–52.0)
HEMOGLOBIN: 13.3 g/dL (ref 13.0–17.0)
MCH: 29.6 pg (ref 26.0–34.0)
MCHC: 32.4 g/dL (ref 30.0–36.0)
MCV: 91.1 fL (ref 78.0–100.0)
Platelets: 208 10*3/uL (ref 150–400)
RBC: 4.5 MIL/uL (ref 4.22–5.81)
RDW: 13.3 % (ref 11.5–15.5)
WBC: 10.3 10*3/uL (ref 4.0–10.5)

## 2015-02-15 LAB — BASIC METABOLIC PANEL
ANION GAP: 10 (ref 5–15)
BUN: 16 mg/dL (ref 6–20)
CO2: 29 mmol/L (ref 22–32)
Calcium: 9.5 mg/dL (ref 8.9–10.3)
Chloride: 101 mmol/L (ref 101–111)
Creatinine, Ser: 1.04 mg/dL (ref 0.61–1.24)
GFR calc non Af Amer: >60 mL/min (ref >60)
GLUCOSE: 181 mg/dL — AB (ref 65–99)
Potassium: 3.5 mmol/L (ref 3.5–5.1)
Sodium: 140 mmol/L (ref 135–145)

## 2015-02-15 LAB — RAPID URINE DRUG SCREEN, HOSP PERFORMED
AMPHETAMINES: NOT DETECTED
BARBITURATES: NOT DETECTED
Benzodiazepines: NOT DETECTED
Cocaine: NOT DETECTED
Opiates: NOT DETECTED
TETRAHYDROCANNABINOL: NOT DETECTED

## 2015-02-15 LAB — CBG MONITORING, ED
Glucose-Capillary: 117 mg/dL — ABNORMAL HIGH (ref 65–99)
Glucose-Capillary: 121 mg/dL — ABNORMAL HIGH (ref 65–99)

## 2015-02-15 LAB — GLUCOSE, CAPILLARY: Glucose-Capillary: 118 mg/dL — ABNORMAL HIGH (ref 65–99)

## 2015-02-15 LAB — TROPONIN I
Troponin I: <0.03 ng/mL (ref <0.031)
Troponin I: <0.03 ng/mL (ref <0.031)

## 2015-02-15 MED ORDER — SERTRALINE HCL 50 MG PO TABS
50.0000 mg | ORAL_TABLET | Freq: Every day | ORAL | Status: DC
  Administered 2015-02-15 – 2015-02-16 (×2): 50 mg via ORAL
  Filled 2015-02-15 (×3): qty 1

## 2015-02-15 MED ORDER — HYDRALAZINE HCL 20 MG/ML IJ SOLN
10.0000 mg | Freq: Four times a day (QID) | INTRAMUSCULAR | Status: DC | PRN
  Administered 2015-02-15 – 2015-02-16 (×2): 10 mg via INTRAVENOUS
  Filled 2015-02-15 (×2): qty 1

## 2015-02-15 MED ORDER — CARVEDILOL 25 MG PO TABS
25.0000 mg | ORAL_TABLET | Freq: Two times a day (BID) | ORAL | Status: DC
  Administered 2015-02-15 – 2015-02-16 (×2): 25 mg via ORAL
  Filled 2015-02-15 (×4): qty 1

## 2015-02-15 MED ORDER — HYDRALAZINE HCL 50 MG PO TABS
50.0000 mg | ORAL_TABLET | Freq: Three times a day (TID) | ORAL | Status: DC
  Administered 2015-02-15 – 2015-02-16 (×3): 50 mg via ORAL
  Filled 2015-02-15 (×5): qty 1

## 2015-02-15 MED ORDER — CARVEDILOL 12.5 MG PO TABS
12.5000 mg | ORAL_TABLET | Freq: Once | ORAL | Status: AC
  Administered 2015-02-15: 12.5 mg via ORAL
  Filled 2015-02-15: qty 1

## 2015-02-15 MED ORDER — HYDRALAZINE HCL 25 MG PO TABS
25.0000 mg | ORAL_TABLET | Freq: Once | ORAL | Status: AC
  Administered 2015-02-15: 25 mg via ORAL

## 2015-02-15 MED ORDER — INSULIN ASPART 100 UNIT/ML ~~LOC~~ SOLN
0.0000 [IU] | Freq: Three times a day (TID) | SUBCUTANEOUS | Status: DC
  Administered 2015-02-15 – 2015-02-16 (×2): 2 [IU] via SUBCUTANEOUS
  Filled 2015-02-15: qty 1

## 2015-02-15 NOTE — Hospital Discharge Follow-Up (Signed)
[facility]:  Received communication from ChurdanAmy Ferrero, RN CM and Marval RegalKim Gibbs, RN CM regarding patient. Admitted for hypertensive urgency.  PCP: Holland CommonsValerie Keck, NP. Hospital follow-up appointment scheduled for 03/01/15 at 0900 with Holland CommonsValerie Keck, NP. AVS updated. Marval RegalKim Gibbs, RN CM updated.

## 2015-02-15 NOTE — ED Notes (Signed)
Bed: [id number] Expected date:  Expected time:  Means of arrival:  Comments: Hold for Rm 19

## 2015-02-15 NOTE — ED Notes (Signed)
Patient returned from CT

## 2015-02-15 NOTE — ED Notes (Signed)
Dr. Cira RueKushman has called back after being paged and says he will down grade pt to a tele bed.

## 2015-02-15 NOTE — Progress Notes (Signed)
[facility] PROGRESS NOTE  Jeffrey Marquez ZHY:[id number] DOB: 07/01/1960 DOA: 02/14/2015  PCP: Ambrose Finland, NP  Brief HPI: 55 year old African-American male with a past medical history of depression, hypertension, diabetes, alcohol and drug abuse issues, who was at behavioral health undergoing treatment and counseling for depression. He was found to have elevated blood pressures. Hospitalist service was consulted. Patient was transferred over to [facility] for further management due to need for parenteral agents.  Past medical history:  Past Medical History  Diagnosis Date  . Diverticulitis   . Hypertension   . Diverticula of colon 2012  . Diabetes mellitus without complication (HCC)   . Obesity   . Drug abuse   . Alcohol abuse     Consultants: None  Procedures: None  Antibiotics: None  Subjective: Patient complains of severe headache involving the frontal part of his head. Also complains of blurry vision. Denies any chest pain or shortness of breath. Denies any weakness on any one side of his body. He mentions that he stopped taking all of his blood pressure medications about 4 weeks ago as he was experiencing erectile dysfunction. He did not speak to his primary care provider regarding this.  Objective: Vital Signs  Filed Vitals:   02/15/15 1130 02/15/15 1238 02/15/15 1300 02/15/15 1320  BP: 152/89 148/91 148/95 148/95  Pulse: 94 94 86 85  Temp:      TempSrc:      Resp: 18 20 20 20   SpO2: 94% 96% 97% 96%   No intake or output data in the 24 hours ending 02/15/15 1328 There were no vitals filed for this visit.  General appearance: alert, cooperative, appears stated age and no distress Resp: clear to auscultation bilaterally Cardio: regular rate and rhythm, S1, S2 normal, no murmur, click, rub or gallop GI: soft, non-tender; bowel sounds normal; no masses,  no organomegaly Extremities: extremities normal, atraumatic, no cyanosis or edema Neurologic: Alert  and oriented 3. Cranial nerves II-12 intact. Strength equal bilateral upper and lower extremities. No pronator drift. Finger-to-nose normal. Gait not assessed.  Lab Results:  Basic Metabolic Panel:  Recent Labs Lab 02/14/15 1607 02/14/15 1935 02/15/15 0500  NA 142  --  140  K 3.8  --  3.5  CL 100*  --  101  CO2 30  --  29  GLUCOSE 100*  --  181*  BUN 13  --  16  CREATININE 1.06 1.06 1.04  CALCIUM 9.7  --  9.5   Liver Function Tests:  Recent Labs Lab 02/14/15 1607  AST 24  ALT 32  ALKPHOS 84  BILITOT 0.6  PROT 7.4  ALBUMIN 3.9   CBC:  Recent Labs Lab 02/14/15 1607 02/14/15 1935 02/15/15 0500  WBC 10.0 10.9* 10.3  HGB 14.1 14.0 13.3  HCT 44.0 43.5 41.0  MCV 91.5 91.6 91.1  PLT 219 205 208   Cardiac Enzymes:  Recent Labs Lab 02/14/15 1935 02/15/15 0142 02/15/15 0805  TROPONINI <0.03 <0.03 <0.03    CBG:  Recent Labs Lab 02/13/15 1152 02/13/15 1658 02/13/15 2105 02/14/15 0613 02/14/15 1152  GLUCAP 89 147* 121* 110* 92    No results found for this or any previous visit (from the past 240 hour(s)).    Studies/Results: Dg Chest 2 View  02/14/2015  CLINICAL DATA:  Hypertensive urgency. EXAM: CHEST  2 VIEW COMPARISON:  None. FINDINGS: Trachea is midline. Heart size normal. Lungs are clear. No pleural fluid. Degenerative changes are seen in the spine. IMPRESSION: No  acute findings. Electronically Signed   By: Leanna BattlesMelinda  Blietz M.D.   On: 02/14/2015 17:01   Ct Head Wo Contrast  02/15/2015  CLINICAL DATA:  Headache, high blood pressure, diabetes mellitus EXAM: CT HEAD WITHOUT CONTRAST TECHNIQUE: Contiguous axial images were obtained from the base of the skull through the vertex without intravenous contrast. COMPARISON:  01/03/2014 FINDINGS: Beam hardening artifacts from jewelry at the LEFT ear. Normal ventricular morphology. No midline shift or mass effect. Normal appearance of brain parenchyma. No intracranial hemorrhage, mass lesion or evidence acute  infarction. No extra-axial fluid collections. Bones and sinuses unremarkable. IMPRESSION: Normal exam. Electronically Signed   By: Ulyses SouthwardMark  Boles M.D.   On: 02/15/2015 11:50    Medications:  Scheduled: . atorvastatin  10 mg Oral Daily  . carvedilol  25 mg Oral BID WC  . enoxaparin (LOVENOX) injection  60 mg Subcutaneous Q24H  . guaiFENesin  1,200 mg Oral BID  . hydrALAZINE  50 mg Oral TID  . hydrochlorothiazide  25 mg Oral Daily   And  . irbesartan  300 mg Oral Daily  . insulin aspart  0-15 Units Subcutaneous TID WC  . metFORMIN  500 mg Oral Q breakfast  . potassium chloride  20 mEq Oral Daily  . sertraline  50 mg Oral Daily   Continuous: . nitroGLYCERIN Stopped (02/15/15 1324)   HQI:[id number]-[id number]:aspirin-acetaminophen-caffeine, ondansetron **OR** ondansetron (ZOFRAN) IV, traZODone  Assessment/Plan:  Active Problems:   Hypertensive urgency    Acute hypertensive urgency Patient was started on nitroglycerin infusion yesterday. Blood pressures are improved. He was also started back on his antihypertensive regimen, which he stopped taking about 4 weeks ago. Blood pressure is improving. Continue to titrate down the nitroglycerin infusion rate. Hopefully we'll be able to tell this off in the next few hours. Increase the dose of his Coreg as well as hydralazine. Headache is most likely due to nitroglycerin infusion. However, due to symptoms of blurry vision, which could be due to elevated blood pressure, we will proceed with CT head. He does not have any focal neurological deficits.  DM type II Continue Metformin. HbA1c is pending. Continue SSI.  Depression [facility] We will need to consult psychiatry once he is medically stable to see if he needs to return to that facility.  DVT Prophylaxis: Lovenox    Code Status: Full Code  Family Communication: Discussed with patient  Disposition Plan: Await improvement in blood pressures. Await CT head. Psychiatry  consulted.    LOS: 1 day   [facility] LLCKRISHNAN,Kemper Heupel  [facility] Pager [phone] 02/15/2015, 1:28 PM  If 7PM-7AM, please contact night-coverage at [email or web address], password [email or web address]

## 2015-02-15 NOTE — ED Notes (Signed)
Patient transported to CT 

## 2015-02-15 NOTE — Progress Notes (Signed)
[facility]  CM aware of pt admission for hypertensive urgency

## 2015-02-16 DIAGNOSIS — F332 Major depressive disorder, recurrent severe without psychotic features: Secondary | ICD-10-CM

## 2015-02-16 LAB — BASIC METABOLIC PANEL
Anion gap: 11 (ref 5–15)
BUN: 14 mg/dL (ref 6–20)
CHLORIDE: 101 mmol/L (ref 101–111)
CO2: 28 mmol/L (ref 22–32)
Calcium: 9.2 mg/dL (ref 8.9–10.3)
Creatinine, Ser: 0.81 mg/dL (ref 0.61–1.24)
GFR calc non Af Amer: >60 mL/min (ref >60)
Glucose, Bld: 109 mg/dL — ABNORMAL HIGH (ref 65–99)
POTASSIUM: 3.5 mmol/L (ref 3.5–5.1)
SODIUM: 140 mmol/L (ref 135–145)

## 2015-02-16 LAB — GLUCOSE, CAPILLARY
GLUCOSE-CAPILLARY: 133 mg/dL — AB (ref 65–99)
Glucose-Capillary: 101 mg/dL — ABNORMAL HIGH (ref 65–99)

## 2015-02-16 MED ORDER — METFORMIN HCL ER 500 MG PO TB24: 500.0000 mg | ORAL_TABLET | Freq: Every day | ORAL | Status: DC

## 2015-02-16 MED ORDER — HYDRALAZINE HCL 50 MG PO TABS: 50.0000 mg | ORAL_TABLET | Freq: Three times a day (TID) | ORAL | Status: DC

## 2015-02-16 MED ORDER — BUTALBITAL-APAP-CAFFEINE 50-325-40 MG PO TABS
2.0000 | ORAL_TABLET | Freq: Once | ORAL | Status: AC
  Administered 2015-02-16: 2 via ORAL

## 2015-02-16 MED ORDER — POTASSIUM CHLORIDE ER 20 MEQ PO TBCR: 20.0000 meq | EXTENDED_RELEASE_TABLET | Freq: Every day | ORAL | Status: DC

## 2015-02-16 MED ORDER — BUTALBITAL-APAP-CAFFEINE 50-325-40 MG PO TABS
1.0000 | ORAL_TABLET | ORAL | Status: DC | PRN
  Filled 2015-02-16: qty 2

## 2015-02-16 MED ORDER — VALSARTAN-HYDROCHLOROTHIAZIDE 320-25 MG PO TABS: 1.0000 | ORAL_TABLET | Freq: Every day | ORAL | Status: DC

## 2015-02-16 MED ORDER — BUTALBITAL-APAP-CAFFEINE 50-325-40 MG PO TABS: 1.0000 | ORAL_TABLET | ORAL | Status: DC | PRN

## 2015-02-16 MED ORDER — ATORVASTATIN CALCIUM 10 MG PO TABS: 10.0000 mg | ORAL_TABLET | Freq: Every day | ORAL | Status: DC

## 2015-02-16 MED ORDER — CARVEDILOL 25 MG PO TABS: 25.0000 mg | ORAL_TABLET | Freq: Two times a day (BID) | ORAL | Status: DC

## 2015-02-16 MED ORDER — TRAZODONE HCL 50 MG PO TABS: 50.0000 mg | ORAL_TABLET | Freq: Every evening | ORAL | Status: DC | PRN

## 2015-02-16 MED ORDER — SERTRALINE HCL 50 MG PO TABS: 50.0000 mg | ORAL_TABLET | Freq: Every day | ORAL | Status: DC

## 2015-02-16 NOTE — Discharge Instructions (Signed)
Hypertension Hypertension, commonly called high blood pressure, is when the force of blood pumping through your arteries is too strong. Your arteries are the blood vessels that carry blood from your heart throughout your body. A blood pressure reading consists of a higher number over a lower number, such as 110/72. The higher number (systolic) is the pressure inside your arteries when your heart pumps. The lower number (diastolic) is the pressure inside your arteries when your heart relaxes. Ideally you want your blood pressure below 120/80. Hypertension forces your heart to work harder to pump blood. Your arteries may become narrow or stiff. Having untreated or uncontrolled hypertension can cause heart attack, stroke, kidney disease, and other problems. RISK FACTORS Some risk factors for high blood pressure are controllable. Others are not.  Risk factors you cannot control include:   Race. You may be at higher risk if you are African American.  Age. Risk increases with age.  Gender. Men are at higher risk than women before age 45 years. After age 65, women are at higher risk than men. Risk factors you can control include:  Not getting enough exercise or physical activity.  Being overweight.  Getting too much fat, sugar, calories, or salt in your diet.  Drinking too much alcohol. SIGNS AND SYMPTOMS Hypertension does not usually cause signs or symptoms. Extremely high blood pressure (hypertensive crisis) may cause headache, anxiety, shortness of breath, and nosebleed. DIAGNOSIS To check if you have hypertension, your health care provider will measure your blood pressure while you are seated, with your arm held at the level of your heart. It should be measured at least twice using the same arm. Certain conditions can cause a difference in blood pressure between your right and left arms. A blood pressure reading that is higher than normal on one occasion does not mean that you need treatment. If  it is not clear whether you have high blood pressure, you may be asked to return on a different day to have your blood pressure checked again. Or, you may be asked to monitor your blood pressure at home for 1 or more weeks. TREATMENT Treating high blood pressure includes making lifestyle changes and possibly taking medicine. Living a healthy lifestyle can help lower high blood pressure. You may need to change some of your habits. Lifestyle changes may include:  Following the DASH diet. This diet is high in fruits, vegetables, and whole grains. It is low in salt, red meat, and added sugars.  Keep your sodium intake below 2,300 mg per day.  Getting at least 30-45 minutes of aerobic exercise at least 4 times per week.  Losing weight if necessary.  Not smoking.  Limiting alcoholic beverages.  Learning ways to reduce stress. Your health care provider may prescribe medicine if lifestyle changes are not enough to get your blood pressure under control, and if one of the following is true:  You are 18-59 years of age and your systolic blood pressure is above 140.  You are 60 years of age or older, and your systolic blood pressure is above 150.  Your diastolic blood pressure is above 90.  You have diabetes, and your systolic blood pressure is over 140 or your diastolic blood pressure is over 90.  You have kidney disease and your blood pressure is above 140/90.  You have heart disease and your blood pressure is above 140/90. Your personal target blood pressure may vary depending on your medical conditions, your age, and other factors. HOME CARE INSTRUCTIONS    Have your blood pressure rechecked as directed by your health care provider.   Take medicines only as directed by your health care provider. Follow the directions carefully. Blood pressure medicines must be taken as prescribed. The medicine does not work as well when you skip doses. Skipping doses also puts you at risk for  problems.  Do not smoke.   Monitor your blood pressure at home as directed by your health care provider. SEEK MEDICAL CARE IF:   You think you are having a reaction to medicines taken.  You have recurrent headaches or feel dizzy.  You have swelling in your ankles.  You have trouble with your vision. SEEK IMMEDIATE MEDICAL CARE IF:  You develop a severe headache or confusion.  You have unusual weakness, numbness, or feel faint.  You have severe chest or abdominal pain.  You vomit repeatedly.  You have trouble breathing. MAKE SURE YOU:   Understand these instructions.  Will watch your condition.  Will get help right away if you are not doing well or get worse.   This information is not intended to replace advice given to you by your health care provider. Make sure you discuss any questions you have with your health care provider.   Document Released: 01/20/2005 Document Revised: 06/06/2014 Document Reviewed: 11/12/2012 Elsevier Interactive Patient Education 2016 [employer]  

## 2015-02-16 NOTE — Clinical Social Work Note (Signed)
CSW left a msg with Chief Julius Bowelsollock [phone] who is handling this pt's case. CSW then spoke with non-emergency transport who is dispatching an officer to pick up the pt.    Etta QuillSarah Gonzalez-Graham, LCSW [phone] Hospital psychiatric & 5E, 5W 31-35 Licensed Clinical Social Worker

## 2015-02-16 NOTE — Discharge Summary (Signed)
[facility]  Physician Discharge Summary   Patient ID: Jeffrey CorollaGary Connors MRN: [id number] DOB/AGE: November 18, 1960 55 y.o.  Admit date: 02/14/2015 Discharge date: 02/16/2015  PCP: Ambrose FinlandValerie A Keck, NP  DISCHARGE DIAGNOSES:  Principal Problem:   MDD (major depressive disorder), recurrent severe, without psychosis (HCC) Active Problems:   DM II (diabetes mellitus, type II), controlled (HCC)   Hypertensive urgency   RECOMMENDATIONS FOR OUTPATIENT FOLLOW UP: 1. Close monitoring of blood pressure  DISCHARGE CONDITION: fair  Diet recommendation: Low-sodium  INITIAL HISTORY: 55 year old African-American male with a past medical history of depression, hypertension, diabetes, alcohol and drug abuse issues, who was at behavioral health undergoing treatment and counseling for depression. He was found to have elevated blood pressures. Hospitalist service was consulted. [facility] due to need for parenteral agents.   HOSPITAL COURSE:   Acute hypertensive urgency Patient was at behavioral health when his blood pressure was noted to be elevated. He was brought over to this hospital. He was initiated on a nitroglycerin infusion. Blood pressure started improving. He was started back on his blood pressure medications. He mentioned to me that he stopped taking his blood pressure medications few weeks ago as it caused erectile dysfunction. This is a most likely reason for uncontrolled blood pressures. The patient was also complaining of headache. This prompted a CT scan of the head which was unremarkable. He mentioned that he's had blurry vision and has had this problem for many months. He has not seen an eye doctor. He was asked to see the same for further problems with his vision. For his headaches he was given Fioricet with some improvement. He does not have any focal neurological deficits. Blood pressures have improved. Optimal blood pressure  control would take at least 1-2 weeks more while taking current medications. But he is stable for discharge.  DM type II Continue Metformin. HbA1c is 6.2.   Depression He [facility]. Seen by psychiatry today. They have cleared him for discharge.   Apparently patient has had parole violations recently. Social worker was notified. [facility] [employer] [place] been notified as well. The plan to take him to prison. Patient is medically stable for discharge from the hospital.    PERTINENT LABS:  The results of significant diagnostics from this hospitalization (including imaging, microbiology, ancillary and laboratory) are listed below for reference.       Labs: Basic Metabolic Panel:  Recent Labs Lab 02/14/15 1607 02/14/15 1935 02/15/15 0500 02/16/15 0541  NA 142  --  140 140  K 3.8  --  3.5 3.5  CL 100*  --  101 101  CO2 30  --  29 28  GLUCOSE 100*  --  181* 109*  BUN 13  --  16 14  CREATININE 1.06 1.06 1.04 0.81  CALCIUM 9.7  --  9.5 9.2   Liver Function Tests:  Recent Labs Lab 02/14/15 1607  AST 24  ALT 32  ALKPHOS 84  BILITOT 0.6  PROT 7.4  ALBUMIN 3.9   CBC:  Recent Labs Lab 02/14/15 1607 02/14/15 1935 02/15/15 0500  WBC 10.0 10.9* 10.3  HGB 14.1 14.0 13.3  HCT 44.0 43.5 41.0  MCV 91.5 91.6 91.1  PLT 219 205 208   Cardiac Enzymes:  Recent Labs Lab 02/14/15 1935 02/15/15 0142 02/15/15 0805  TROPONINI <0.03 <0.03 <0.03   CBG:  Recent Labs Lab 02/15/15 1441 02/15/15 1703 02/15/15 2120 02/16/15 0736 02/16/15 1155  GLUCAP 117* 121* 118* 133* 101*     IMAGING STUDIES Dg Chest 2 View  02/14/2015  CLINICAL DATA:  Hypertensive urgency. EXAM: CHEST  2 VIEW COMPARISON:  None. FINDINGS: Trachea is midline. Heart size normal. Lungs are clear. No pleural fluid. Degenerative changes are seen in the spine. IMPRESSION: No acute findings. Electronically Signed   By: Leanna Battles M.D.   On: 02/14/2015 17:01   Ct Head  Wo Contrast  02/15/2015  CLINICAL DATA:  Headache, high blood pressure, diabetes mellitus EXAM: CT HEAD WITHOUT CONTRAST TECHNIQUE: Contiguous axial images were obtained from the base of the skull through the vertex without intravenous contrast. COMPARISON:  01/03/2014 FINDINGS: Beam hardening artifacts from jewelry at the LEFT ear. Normal ventricular morphology. No midline shift or mass effect. Normal appearance of brain parenchyma. No intracranial hemorrhage, mass lesion or evidence acute infarction. No extra-axial fluid collections. Bones and sinuses unremarkable. IMPRESSION: Normal exam. Electronically Signed   By: Ulyses Southward M.D.   On: 02/15/2015 11:50    DISCHARGE EXAMINATION: Filed Vitals:   02/16/15 0648 02/16/15 0914 02/16/15 1113 02/16/15 1359  BP: 153/93 152/88  153/90  Pulse:  78 77 75  Temp:  97.7 F (36.5 C)  98.3 F (36.8 C)  TempSrc:  Oral  Oral  Resp:   20   SpO2:  96%     General appearance: alert, cooperative, appears stated age and no distress Resp: clear to auscultation bilaterally Cardio: regular rate and rhythm, S1, S2 normal, no murmur, click, rub or gallop GI: soft, non-tender; bowel sounds normal; no masses,  no organomegaly Neurologic: Alert and oriented X 3, normal strength and tone. Normal symmetric reflexes. Normal coordination and gait  DISPOSITION: Estate agent to determine  Discharge Instructions    Call MD for:  difficulty breathing, headache or visual disturbances    Complete by:  As directed      Call MD for:  extreme fatigue    Complete by:  As directed      Call MD for:  persistant dizziness or light-headedness    Complete by:  As directed      Call MD for:  severe uncontrolled pain    Complete by:  As directed      Call MD for:  temperature >100.4    Complete by:  As directed      Diet - low sodium heart healthy    Complete by:  As directed      Discharge instructions    Complete by:  As directed   Please take your medications as  prescribed. Since you have been off of your blood pressure medications for a few weeks, blood pressure will take some time to get controlled. He should have your blood pressure checked periodically.  You were cared for by a hospitalist during your hospital stay. If you have any questions about your discharge medications or the care you received while you were in the hospital after you are discharged, you can call the unit and asked to speak with the hospitalist on call if the hospitalist that took care of you is not available. Once you are discharged, your primary care physician will handle any further medical issues. Please note that NO REFILLS for any discharge medications will be authorized once you are discharged, as it is imperative that you return to your primary care physician (or establish a relationship with a primary care physician if you do not have one) for your aftercare needs so that they  can reassess your need for medications and monitor your lab values. If you do not have a primary care physician, you can call [phone] for a physician referral.     Increase activity slowly    Complete by:  As directed            ALLERGIES: No Known Allergies   Current Discharge Medication List    START taking these medications   Details  butalbital-acetaminophen-caffeine (FIORICET, ESGIC) 50-325-40 MG tablet Take 1-2 tablets by mouth every 4 (four) hours as needed for headache. Qty: 30 tablet, Refills: 0    sertraline (ZOLOFT) 50 MG tablet Take 1 tablet (50 mg total) by mouth daily. Qty: 30 tablet, Refills: 0      CONTINUE these medications which have CHANGED   Details  atorvastatin (LIPITOR) 10 MG tablet Take 1 tablet (10 mg total) by mouth daily. Qty: 30 tablet, Refills: 5   Associated Diagnoses: Type 2 diabetes mellitus without complication (HCC)    carvedilol (COREG) 25 MG tablet Take 1 tablet (25 mg total) by mouth 2 (two) times daily with a meal. Qty: 60 tablet, Refills: 0      hydrALAZINE (APRESOLINE) 50 MG tablet Take 1 tablet (50 mg total) by mouth 3 (three) times daily. Qty: 90 tablet, Refills: 0    metFORMIN (GLUCOPHAGE XR) 500 MG 24 hr tablet Take 1 tablet (500 mg total) by mouth daily with breakfast. Qty: 30 tablet, Refills: 0   Associated Diagnoses: Type 2 diabetes mellitus without complication (HCC)    Potassium Chloride ER 20 MEQ TBCR Take 20 mEq by mouth daily. Qty: 30 tablet, Refills: 0   Associated Diagnoses: Essential hypertension, malignant; Accelerated hypertension    traZODone (DESYREL) 50 MG tablet Take 1 tablet (50 mg total) by mouth at bedtime as needed for sleep. Qty: 30 tablet, Refills: 0    valsartan-hydrochlorothiazide (DIOVAN-HCT) 320-25 MG tablet Take 1 tablet by mouth daily. Qty: 30 tablet, Refills: 0      CONTINUE these medications which have NOT CHANGED   Details  guaiFENesin (MUCINEX) 600 MG 12 hr tablet Take 2 tablets (1,200 mg total) by mouth 2 (two) times daily. May purchase over the counter as desired.    nitroGLYCERIN (NITROSTAT) 0.4 MG SL tablet Place 1 tablet (0.4 mg total) under the tongue every 5 (five) minutes as needed for chest pain. Qty: 25 tablet, Refills: 6   Associated Diagnoses: Essential hypertension, malignant; Unstable angina (HCC)      STOP taking these medications     aspirin-acetaminophen-caffeine (EXCEDRIN MIGRAINE) 250-250-65 MG per tablet      buPROPion (WELLBUTRIN XL) 300 MG 24 hr tablet        Follow-up Information    Follow up with [facility] On 03/01/2015.   Why:  Hospital follow-up appointment on 03/01/15 at 9:00 am with Holland Commons, NP   Contact information:   [address] [place] [address] [phone]      TOTAL DISCHARGE TIME: 35 mins  [facility]  [facility] Pager [phone]  02/16/2015, 2:31 PM

## 2015-02-16 NOTE — Clinical Social Work Note (Signed)
CSW met with pt at bedside. Pt was discharged from [facility] and brought to the ED for issues related to hypertension. Pt states there is a warrant out for his arrest and staff will need to call police when he's discharged. Pt states this is related to a 53 yr old charge that is still unresolved. CSW checked the [place] public offender website and pt is listed as an absconder. CSW will continue to follow for any needs until d/c. Pt has already been provided homeless shelter and food pantry resources.    Cindra Presume, LCSW [phone] Hospital psychiatric & 5E, 5W 28-36 Licensed Clinical Social Worker

## 2015-02-16 NOTE — Consult Note (Signed)
[place] Psychiatry Consult   Reason for Consult:  Depression and recently discharged from [facility] Referring Physician:  Dr. Maryland Pink Patient Identification: Jeffrey Marquez MRN:  [id number] Principal Diagnosis: MDD (major depressive disorder), recurrent severe, without psychosis ([place]) Diagnosis:   Patient Active Problem List   Diagnosis Date Noted  . Hypertensive urgency [I16.0] 02/14/2015  . MDD (major depressive disorder), recurrent severe, without psychosis ([place]) [F33.2] 02/05/2015  . Unstable angina ([place]) [I20.0] 10/18/2014  . Essential hypertension, malignant [I10] 01/10/2014  . DM II (diabetes mellitus, type II), controlled ([place]) [E11.9] 11/27/2013    Total Time spent with patient: 1 hour  Subjective:   Jeffrey Marquez is a 55 y.o. male patient admitted with depression.  HPI: Jeffrey Marquez is a 55 years old male seen, chart reviewed for face-to-face psychiatric consultation and evaluation of recent episode of depression and acute psychiatric hospitalization [facility]. Patient reported he has been depressed and also relapsed on drug of abuse because he was kicked out of girlfriend's home. Patient also reportedly had probation 15 years ago and also has a current probation violation charges him. Patient reportedly started feeling better with the inpatient psychiatric hospitalization and medication management which is completed on 02/12/2015 and then sent to the [facility] long hospital for malignant essential hypertension and unstable angina. Patient reportedly taking his medication as prescribed and has no reported adverse effect. Patient denied current symptoms of depression but reportedly anxious about going to the [facility] for probation violation. Patient reportedly has heavy alcohol drinking and cocaine use in the past in 1990s. Patient is currently denying abuse or dependence but reportedly relapsed on new years Eve.    Past Psychiatric History: As noted  above admitted to behavioral [facility] from 02/06/2015 210 oh ninth 2017 for major depressive disorder recurrent and substance abuse along with alcohol intoxication and cocaine intoxication.  Risk to Self: Is patient at risk for suicide?: No (patient just cleared from [facility]) Risk to Others:   Prior Inpatient Therapy:   Prior Outpatient Therapy:    Past Medical History:  Past Medical History  Diagnosis Date  . Diverticulitis   . Hypertension   . Diverticula of colon 2012  . Diabetes mellitus without complication ([place])   . Obesity   . Drug abuse   . Alcohol abuse     Past Surgical History  Procedure Laterality Date  . Cardiac catheterization N/A 10/18/2014    Procedure: Left Heart Cath and Coronary Angiography;  Surgeon: Sherren Mocha, MD;  Location: [facility];  Service: Cardiovascular;  Laterality: N/A;   Family History:  Family History  Problem Relation Age of Onset  . Diabetes Mother   . Diabetes Brother    Family Psychiatric  History: Denied Social History:  History  Alcohol Use  . Yes     History  Drug Use  . Yes  . Special: Cocaine    Social History   Social History  . Marital Status: Divorced    Spouse Name: N/A  . Number of Children: N/A  . Years of Education: N/A   Social History Main Topics  . Smoking status: Never Smoker   . Smokeless tobacco: Never Used  . Alcohol Use: Yes  . Drug Use: Yes    Special: Cocaine  . Sexual Activity: Not Asked   Other Topics Concern  . None   Social History Narrative   Additional Social History: Currently homeless and hopes he can find it placed to live and also work  in a [employer] which she has been working for the last 2 years. Patient also has a thoughts about going back to [place] with extended family members when he can buy it bus ticket.                          Allergies:  No Known Allergies  Labs:  Results for orders placed or performed during the hospital encounter of  02/14/15 (from the past 48 hour(s))  CBC     Status: None   Collection Time: 02/14/15  4:07 PM  Result Value Ref Range   WBC 10.0 4.0 - 10.5 K/uL   RBC 4.81 4.22 - 5.81 MIL/uL   Hemoglobin 14.1 13.0 - 17.0 g/dL   HCT 44.0 39.0 - 52.0 %   MCV 91.5 78.0 - 100.0 fL   MCH 29.3 26.0 - 34.0 pg   MCHC 32.0 30.0 - 36.0 g/dL   RDW 13.3 11.5 - 15.5 %   Platelets 219 150 - 400 K/uL  Comprehensive metabolic panel     Status: Abnormal   Collection Time: 02/14/15  4:07 PM  Result Value Ref Range   Sodium 142 135 - 145 mmol/L   Potassium 3.8 3.5 - 5.1 mmol/L   Chloride 100 (L) 101 - 111 mmol/L   CO2 30 22 - 32 mmol/L   Glucose, Bld 100 (H) 65 - 99 mg/dL   BUN 13 6 - 20 mg/dL   Creatinine, Ser 1.06 0.61 - 1.24 mg/dL   Calcium 9.7 8.9 - 10.3 mg/dL   Total Protein 7.4 6.5 - 8.1 g/dL   Albumin 3.9 3.5 - 5.0 g/dL   AST 24 15 - 41 U/L   ALT 32 17 - 63 U/L   Alkaline Phosphatase 84 38 - 126 U/L   Total Bilirubin 0.6 0.3 - 1.2 mg/dL   GFR calc non Af Amer >60 >60 mL/min   GFR calc Af Amer >60 >60 mL/min    Comment: (NOTE) The eGFR has been calculated using the CKD EPI equation. This calculation has not been validated in all clinical situations. eGFR's persistently <60 mL/min signify possible Chronic Kidney Disease.    Anion gap 12 5 - 15  CBC     Status: Abnormal   Collection Time: 02/14/15  7:35 PM  Result Value Ref Range   WBC 10.9 (H) 4.0 - 10.5 K/uL   RBC 4.75 4.22 - 5.81 MIL/uL   Hemoglobin 14.0 13.0 - 17.0 g/dL   HCT 43.5 39.0 - 52.0 %   MCV 91.6 78.0 - 100.0 fL   MCH 29.5 26.0 - 34.0 pg   MCHC 32.2 30.0 - 36.0 g/dL   RDW 13.3 11.5 - 15.5 %   Platelets 205 150 - 400 K/uL  Creatinine, serum     Status: None   Collection Time: 02/14/15  7:35 PM  Result Value Ref Range   Creatinine, Ser 1.06 0.61 - 1.24 mg/dL   GFR calc non Af Amer >60 >60 mL/min   GFR calc Af Amer >60 >60 mL/min    Comment: (NOTE) The eGFR has been calculated using the CKD EPI equation. This calculation has  not been validated in all clinical situations. eGFR's persistently <60 mL/min signify possible Chronic Kidney Disease.   TSH     Status: None   Collection Time: 02/14/15  7:35 PM  Result Value Ref Range   TSH 1.262 0.350 - 4.500 uIU/mL  Troponin I  Status: None   Collection Time: 02/14/15  7:35 PM  Result Value Ref Range   Troponin I <0.03 <0.031 ng/mL    Comment:        NO INDICATION OF MYOCARDIAL INJURY.   Hemoglobin A1c     Status: Abnormal   Collection Time: 02/14/15  7:35 PM  Result Value Ref Range   Hgb A1c MFr Bld 6.2 (H) 4.8 - 5.6 %    Comment: (NOTE)         Pre-diabetes: 5.7 - 6.4         Diabetes: >6.4         Glycemic control for adults with diabetes: <7.0    Mean Plasma Glucose 131 mg/dL    Comment: (NOTE) Performed At: [facility] [address] [place], [place] [id number] Lindon Romp MD LE:[id number]   Troponin I     Status: None   Collection Time: 02/15/15  1:42 AM  Result Value Ref Range   Troponin I <0.03 <0.031 ng/mL    Comment:        NO INDICATION OF MYOCARDIAL INJURY.   Urine rapid drug screen (hosp performed)     Status: None   Collection Time: 02/15/15  3:47 AM  Result Value Ref Range   Opiates NONE DETECTED NONE DETECTED   Cocaine NONE DETECTED NONE DETECTED   Benzodiazepines NONE DETECTED NONE DETECTED   Amphetamines NONE DETECTED NONE DETECTED   Tetrahydrocannabinol NONE DETECTED NONE DETECTED   Barbiturates NONE DETECTED NONE DETECTED    Comment:        DRUG SCREEN FOR MEDICAL PURPOSES ONLY.  IF CONFIRMATION IS NEEDED FOR ANY PURPOSE, NOTIFY LAB WITHIN 5 DAYS.        LOWEST DETECTABLE LIMITS FOR URINE DRUG SCREEN Drug Class       Cutoff (ng/mL) Amphetamine      1000 Barbiturate      200 Benzodiazepine   449 Tricyclics       675 Opiates          300 Cocaine          300 THC              50   Basic metabolic panel     Status: Abnormal   Collection Time: 02/15/15  5:00 AM  Result Value Ref Range   Sodium 140  135 - 145 mmol/L   Potassium 3.5 3.5 - 5.1 mmol/L   Chloride 101 101 - 111 mmol/L   CO2 29 22 - 32 mmol/L   Glucose, Bld 181 (H) 65 - 99 mg/dL   BUN 16 6 - 20 mg/dL   Creatinine, Ser 1.04 0.61 - 1.24 mg/dL   Calcium 9.5 8.9 - 10.3 mg/dL   GFR calc non Af Amer >60 >60 mL/min   GFR calc Af Amer >60 >60 mL/min    Comment: (NOTE) The eGFR has been calculated using the CKD EPI equation. This calculation has not been validated in all clinical situations. eGFR's persistently <60 mL/min signify possible Chronic Kidney Disease.    Anion gap 10 5 - 15  CBC     Status: None   Collection Time: 02/15/15  5:00 AM  Result Value Ref Range   WBC 10.3 4.0 - 10.5 K/uL   RBC 4.50 4.22 - 5.81 MIL/uL   Hemoglobin 13.3 13.0 - 17.0 g/dL   HCT 41.0 39.0 - 52.0 %   MCV 91.1 78.0 - 100.0 fL   MCH 29.6 26.0 - 34.0 pg  MCHC 32.4 30.0 - 36.0 g/dL   RDW 13.3 11.5 - 15.5 %   Platelets 208 150 - 400 K/uL  Troponin I     Status: None   Collection Time: 02/15/15  8:05 AM  Result Value Ref Range   Troponin I <0.03 <0.031 ng/mL    Comment:        NO INDICATION OF MYOCARDIAL INJURY.   CBG monitoring, ED     Status: Abnormal   Collection Time: 02/15/15  2:41 PM  Result Value Ref Range   Glucose-Capillary 117 (H) 65 - 99 mg/dL  CBG monitoring, ED     Status: Abnormal   Collection Time: 02/15/15  5:03 PM  Result Value Ref Range   Glucose-Capillary 121 (H) 65 - 99 mg/dL  Glucose, capillary     Status: Abnormal   Collection Time: 02/15/15  9:20 PM  Result Value Ref Range   Glucose-Capillary 118 (H) 65 - 99 mg/dL  Basic metabolic panel     Status: Abnormal   Collection Time: 02/16/15  5:41 AM  Result Value Ref Range   Sodium 140 135 - 145 mmol/L   Potassium 3.5 3.5 - 5.1 mmol/L   Chloride 101 101 - 111 mmol/L   CO2 28 22 - 32 mmol/L   Glucose, Bld 109 (H) 65 - 99 mg/dL   BUN 14 6 - 20 mg/dL   Creatinine, Ser 0.81 0.61 - 1.24 mg/dL   Calcium 9.2 8.9 - 10.3 mg/dL   GFR calc non Af Amer >60 >60 mL/min    GFR calc Af Amer >60 >60 mL/min    Comment: (NOTE) The eGFR has been calculated using the CKD EPI equation. This calculation has not been validated in all clinical situations. eGFR's persistently <60 mL/min signify possible Chronic Kidney Disease.    Anion gap 11 5 - 15  Glucose, capillary     Status: Abnormal   Collection Time: 02/16/15  7:36 AM  Result Value Ref Range   Glucose-Capillary 133 (H) 65 - 99 mg/dL    Current Facility-Administered Medications  Medication Dose Route Frequency Provider Last Rate Last Dose  . atorvastatin (LIPITOR) tablet 10 mg  10 mg Oral Daily Theodis Blaze, MD   10 mg at 02/16/15 1036  . butalbital-acetaminophen-caffeine (FIORICET, ESGIC) 50-325-40 MG per tablet 1-2 tablet  1-2 tablet Oral Q4H PRN Bonnielee Haff, MD      . butalbital-acetaminophen-caffeine (FIORICET, ESGIC) 50-325-40 MG per tablet 2 tablet  2 tablet Oral Once Bonnielee Haff, MD      . carvedilol (COREG) tablet 25 mg  25 mg Oral BID WC Bonnielee Haff, MD   25 mg at 02/16/15 0806  . enoxaparin (LOVENOX) injection 60 mg  60 mg Subcutaneous Q24H Theodis Blaze, MD   60 mg at 02/15/15 2130  . guaiFENesin (MUCINEX) 12 hr tablet 1,200 mg  1,200 mg Oral BID Theodis Blaze, MD   1,200 mg at 02/16/15 1038  . hydrALAZINE (APRESOLINE) injection 10 mg  10 mg Intravenous Q6H PRN Bonnielee Haff, MD   10 mg at 02/16/15 0521  . hydrALAZINE (APRESOLINE) tablet 50 mg  50 mg Oral TID Bonnielee Haff, MD   50 mg at 02/16/15 1036  . hydrochlorothiazide (HYDRODIURIL) tablet 25 mg  25 mg Oral Daily Theodis Blaze, MD   25 mg at 02/16/15 1037   And  . irbesartan (AVAPRO) tablet 300 mg  300 mg Oral Daily Theodis Blaze, MD   300 mg at 02/16/15 1037  .  insulin aspart (novoLOG) injection 0-15 Units  0-15 Units Subcutaneous TID WC Bonnielee Haff, MD   2 Units at 02/16/15 0810  . metFORMIN (GLUCOPHAGE-XR) 24 hr tablet 500 mg  500 mg Oral Q breakfast Theodis Blaze, MD   500 mg at 02/16/15 1037  . ondansetron (ZOFRAN) tablet  4 mg  4 mg Oral Q6H PRN Theodis Blaze, MD       Or  . ondansetron [facility]) injection 4 mg  4 mg Intravenous Q6H PRN Theodis Blaze, MD      . potassium chloride SA (K-DUR,KLOR-CON) CR tablet 20 mEq  20 mEq Oral Daily Theodis Blaze, MD   20 mEq at 02/16/15 1037  . sertraline (ZOLOFT) tablet 50 mg  50 mg Oral Daily Bonnielee Haff, MD   50 mg at 02/15/15 1205  . traZODone (DESYREL) tablet 50 mg  50 mg Oral QHS PRN Theodis Blaze, MD        Musculoskeletal: Strength & Muscle Tone: within normal limits Gait & Station: normal Patient leans: N/A  Psychiatric Specialty Exam: ROS  Blood pressure 152/88, pulse 78, temperature 97.7 F (36.5 C), temperature source Oral, resp. rate 22, SpO2 96 %.There is no weight on file to calculate BMI.  General Appearance: Casual  Eye Contact::  Good  Speech:  Clear and Coherent  Volume:  Normal  Mood:  Anxious  Affect:  Appropriate and Congruent  Thought Process:  Coherent and Goal Directed  Orientation:  Full (Time, Place, and Person)  Thought Content:  WDL  Suicidal Thoughts:  No  Homicidal Thoughts:  No  Memory:  Immediate;   Good Recent;   Good  Judgement:  Intact  Insight:  Fair  Psychomotor Activity:  Normal  Concentration:  Good  Recall:  Good  Fund of Knowledge:Good  Language: Good  Akathisia:  Negative  Handed:  Right  AIMS (if indicated):     Assets:  Communication Skills Desire for Improvement Leisure Time Resilience Social Support Talents/Skills  ADL's:  Intact  Cognition: WNL  Sleep:      Treatment Plan Summary: Daily contact with patient to assess and evaluate symptoms and progress in treatment and Medication management  Case discussed with Dr. [place] Pink Refer to the psychiatric social service regarding contacting probation officer/Sherif department as he has requested because of pending probation violation charge.  Discontinue [employer] as patient has no safety concerns and contract for safety Continue sertraline 50 mg  daily for depression and trazodone 50 mg at bedtime as needed for insomnia Patient is psychiatrically cleared for discharged when medically stable  Disposition: Patient does not meet criteria for psychiatric inpatient admission. Supportive therapy provided about ongoing stressors.  Jeffrey Marquez,Jeffrey R. 02/16/2015 10:57 AM

## 2015-02-23 DIAGNOSIS — H538 Other visual disturbances: Secondary | ICD-10-CM | POA: Insufficient documentation

## 2015-02-23 DIAGNOSIS — I10 Essential (primary) hypertension: Secondary | ICD-10-CM | POA: Insufficient documentation

## 2015-03-01 ENCOUNTER — Ambulatory Visit: Payer: Self-pay | Admitting: Internal Medicine

## 2015-03-30 ENCOUNTER — Other Ambulatory Visit: Payer: Self-pay

## 2015-04-03 ENCOUNTER — Ambulatory Visit: Payer: Self-pay | Admitting: Cardiovascular Disease

## 2015-04-04 ENCOUNTER — Encounter: Payer: Self-pay | Admitting: Cardiovascular Disease

## 2015-04-11 ENCOUNTER — Telehealth: Payer: Self-pay | Admitting: Internal Medicine

## 2015-04-11 NOTE — Telephone Encounter (Signed)
Pt. Needing refills on blood pressure medication

## 2015-04-23 ENCOUNTER — Ambulatory Visit: Payer: Self-pay | Admitting: Internal Medicine

## 2015-04-27 ENCOUNTER — Encounter (HOSPITAL_COMMUNITY): Payer: Self-pay | Admitting: Emergency Medicine

## 2015-04-27 ENCOUNTER — Encounter (HOSPITAL_COMMUNITY): Payer: Self-pay | Admitting: *Deleted

## 2015-04-27 ENCOUNTER — Inpatient Hospital Stay (HOSPITAL_COMMUNITY)
Admission: AD | Admit: 2015-04-27 | Discharge: 2015-05-07 | DRG: 885 | Disposition: A | Discharge status: Home / Self Care | Payer: Federal, State, Local not specified - Other | Source: Intra-hospital | Attending: Psychiatry | Admitting: Psychiatry

## 2015-04-27 ENCOUNTER — Emergency Department (HOSPITAL_COMMUNITY)
Admission: EM | Admit: 2015-04-27 | Discharge: 2015-04-27 | Disposition: A | Discharge status: Other Acute Inpatient Hospital | Payer: Self-pay | Attending: Emergency Medicine | Admitting: Emergency Medicine

## 2015-04-27 DIAGNOSIS — F1910 Other psychoactive substance abuse, uncomplicated: Secondary | ICD-10-CM

## 2015-04-27 DIAGNOSIS — F332 Major depressive disorder, recurrent severe without psychotic features: Secondary | ICD-10-CM | POA: Insufficient documentation

## 2015-04-27 DIAGNOSIS — Z7984 Long term (current) use of oral hypoglycemic drugs: Secondary | ICD-10-CM | POA: Insufficient documentation

## 2015-04-27 DIAGNOSIS — G478 Other sleep disorders: Secondary | ICD-10-CM | POA: Insufficient documentation

## 2015-04-27 DIAGNOSIS — R45851 Suicidal ideations: Secondary | ICD-10-CM | POA: Diagnosis present

## 2015-04-27 DIAGNOSIS — E669 Obesity, unspecified: Secondary | ICD-10-CM | POA: Insufficient documentation

## 2015-04-27 DIAGNOSIS — I10 Essential (primary) hypertension: Secondary | ICD-10-CM | POA: Insufficient documentation

## 2015-04-27 DIAGNOSIS — Z8719 Personal history of other diseases of the digestive system: Secondary | ICD-10-CM | POA: Insufficient documentation

## 2015-04-27 DIAGNOSIS — F1410 Cocaine abuse, uncomplicated: Secondary | ICD-10-CM | POA: Insufficient documentation

## 2015-04-27 DIAGNOSIS — E119 Type 2 diabetes mellitus without complications: Secondary | ICD-10-CM | POA: Diagnosis present

## 2015-04-27 DIAGNOSIS — Z9889 Other specified postprocedural states: Secondary | ICD-10-CM | POA: Insufficient documentation

## 2015-04-27 DIAGNOSIS — Z79899 Other long term (current) drug therapy: Secondary | ICD-10-CM | POA: Insufficient documentation

## 2015-04-27 DIAGNOSIS — R41840 Attention and concentration deficit: Secondary | ICD-10-CM | POA: Insufficient documentation

## 2015-04-27 DIAGNOSIS — I200 Unstable angina: Secondary | ICD-10-CM

## 2015-04-27 LAB — COMPREHENSIVE METABOLIC PANEL
ALBUMIN: 4.1 g/dL (ref 3.5–5.0)
ALK PHOS: 79 U/L (ref 38–126)
ALT: 17 U/L (ref 17–63)
AST: 17 U/L (ref 15–41)
Anion gap: 12 (ref 5–15)
BILIRUBIN TOTAL: 1.1 mg/dL (ref 0.3–1.2)
BUN: 13 mg/dL (ref 6–20)
CO2: 27 mmol/L (ref 22–32)
Calcium: 9.3 mg/dL (ref 8.9–10.3)
Chloride: 101 mmol/L (ref 101–111)
Creatinine, Ser: 1.2 mg/dL (ref 0.61–1.24)
GFR calc Af Amer: >60 mL/min (ref >60)
Glucose, Bld: 140 mg/dL — ABNORMAL HIGH (ref 65–99)
Potassium: 3.7 mmol/L (ref 3.5–5.1)
Sodium: 140 mmol/L (ref 135–145)
Total Protein: 7.4 g/dL (ref 6.5–8.1)

## 2015-04-27 LAB — CBC
HCT: 42.9 % (ref 39.0–52.0)
Hemoglobin: 14.5 g/dL (ref 13.0–17.0)
MCH: 29.7 pg (ref 26.0–34.0)
MCHC: 33.8 g/dL (ref 30.0–36.0)
MCV: 87.7 fL (ref 78.0–100.0)
PLATELETS: 229 10*3/uL (ref 150–400)
RBC: 4.89 MIL/uL (ref 4.22–5.81)
RDW: 13.9 % (ref 11.5–15.5)
WBC: 11.5 10*3/uL — AB (ref 4.0–10.5)

## 2015-04-27 LAB — RAPID URINE DRUG SCREEN, HOSP PERFORMED
AMPHETAMINES: NOT DETECTED
BENZODIAZEPINES: NOT DETECTED
Barbiturates: NOT DETECTED
Cocaine: POSITIVE — AB
Opiates: NOT DETECTED
Tetrahydrocannabinol: NOT DETECTED

## 2015-04-27 LAB — ACETAMINOPHEN LEVEL: Acetaminophen (Tylenol), Serum: <10 ug/mL — ABNORMAL LOW (ref 10–30)

## 2015-04-27 LAB — ETHANOL

## 2015-04-27 LAB — SALICYLATE LEVEL: Salicylate Lvl: <4 mg/dL (ref 2.8–30.0)

## 2015-04-27 MED ORDER — HYDRALAZINE HCL 50 MG PO TABS
50.0000 mg | ORAL_TABLET | Freq: Three times a day (TID) | ORAL | Status: DC
Start: 2015-04-27 — End: 2015-04-27
  Administered 2015-04-27: 50 mg via ORAL
  Filled 2015-04-27 (×2): qty 1

## 2015-04-27 MED ORDER — METFORMIN HCL ER 500 MG PO TB24
500.0000 mg | ORAL_TABLET | Freq: Every day | ORAL | Status: DC
  Filled 2015-04-27: qty 1

## 2015-04-27 MED ORDER — NITROGLYCERIN 0.4 MG SL SUBL: 0.4000 mg | SUBLINGUAL_TABLET | SUBLINGUAL | Status: DC | PRN

## 2015-04-27 MED ORDER — TRAZODONE HCL 50 MG PO TABS: 50.0000 mg | ORAL_TABLET | Freq: Every evening | ORAL | Status: DC | PRN

## 2015-04-27 MED ORDER — HYDROCHLOROTHIAZIDE 25 MG PO TABS
25.0000 mg | ORAL_TABLET | Freq: Every day | ORAL | Status: DC
  Administered 2015-04-28 – 2015-05-07 (×10): 25 mg via ORAL
  Filled 2015-04-27 (×11): qty 1

## 2015-04-27 MED ORDER — ATORVASTATIN CALCIUM 10 MG PO TABS
10.0000 mg | ORAL_TABLET | Freq: Every day | ORAL | Status: DC
  Administered 2015-04-27: 10 mg via ORAL
  Filled 2015-04-27: qty 1

## 2015-04-27 MED ORDER — SERTRALINE HCL 50 MG PO TABS
50.0000 mg | ORAL_TABLET | Freq: Every day | ORAL | Status: DC
  Administered 2015-04-27: 50 mg via ORAL
  Filled 2015-04-27: qty 1

## 2015-04-27 MED ORDER — BUTALBITAL-APAP-CAFFEINE 50-325-40 MG PO TABS: 1.0000 | ORAL_TABLET | ORAL | Status: DC | PRN

## 2015-04-27 MED ORDER — CARVEDILOL 25 MG PO TABS
25.0000 mg | ORAL_TABLET | Freq: Two times a day (BID) | ORAL | Status: DC
  Administered 2015-04-28 – 2015-05-07 (×19): 25 mg via ORAL
  Filled 2015-04-27 (×9): qty 1
  Filled 2015-04-27: qty 14
  Filled 2015-04-27 (×5): qty 1
  Filled 2015-04-27 (×2): qty 14
  Filled 2015-04-27 (×2): qty 1
  Filled 2015-04-27: qty 14
  Filled 2015-04-27: qty 1

## 2015-04-27 MED ORDER — SERTRALINE HCL 50 MG PO TABS
50.0000 mg | ORAL_TABLET | Freq: Every day | ORAL | Status: DC
  Administered 2015-04-28: 50 mg via ORAL
  Filled 2015-04-27 (×2): qty 1

## 2015-04-27 MED ORDER — IRBESARTAN 300 MG PO TABS
300.0000 mg | ORAL_TABLET | Freq: Every day | ORAL | Status: DC
  Administered 2015-04-28 – 2015-05-07 (×10): 300 mg via ORAL
  Filled 2015-04-27 (×12): qty 1

## 2015-04-27 MED ORDER — VALSARTAN-HYDROCHLOROTHIAZIDE 320-25 MG PO TABS: 1.0000 | ORAL_TABLET | Freq: Every day | ORAL | Status: DC

## 2015-04-27 MED ORDER — LORAZEPAM 1 MG PO TABS: 1.0000 mg | ORAL_TABLET | Freq: Three times a day (TID) | ORAL | Status: DC | PRN

## 2015-04-27 MED ORDER — IRBESARTAN 300 MG PO TABS
300.0000 mg | ORAL_TABLET | Freq: Every day | ORAL | Status: DC
  Administered 2015-04-27: 300 mg via ORAL
  Filled 2015-04-27: qty 1

## 2015-04-27 MED ORDER — POTASSIUM CHLORIDE CRYS ER 20 MEQ PO TBCR
20.0000 meq | EXTENDED_RELEASE_TABLET | Freq: Every day | ORAL | Status: DC
  Administered 2015-04-28 – 2015-05-07 (×10): 20 meq via ORAL
  Filled 2015-04-27: qty 1
  Filled 2015-04-27: qty 7
  Filled 2015-04-27 (×4): qty 1
  Filled 2015-04-27: qty 7
  Filled 2015-04-27 (×4): qty 1

## 2015-04-27 MED ORDER — LORAZEPAM 1 MG PO TABS
1.0000 mg | ORAL_TABLET | Freq: Three times a day (TID) | ORAL | Status: DC | PRN
  Administered 2015-04-28 – 2015-04-29 (×2): 1 mg via ORAL
  Filled 2015-04-27 (×2): qty 1

## 2015-04-27 MED ORDER — POTASSIUM CHLORIDE ER 20 MEQ PO TBCR: 20.0000 meq | EXTENDED_RELEASE_TABLET | Freq: Every day | ORAL | Status: DC

## 2015-04-27 MED ORDER — MAGNESIUM HYDROXIDE 400 MG/5ML PO SUSP: 30.0000 mL | Freq: Every day | ORAL | Status: DC | PRN

## 2015-04-27 MED ORDER — HYDROCHLOROTHIAZIDE 25 MG PO TABS
25.0000 mg | ORAL_TABLET | Freq: Every day | ORAL | Status: DC
  Administered 2015-04-27: 25 mg via ORAL
  Filled 2015-04-27: qty 1

## 2015-04-27 MED ORDER — ATORVASTATIN CALCIUM 10 MG PO TABS
10.0000 mg | ORAL_TABLET | Freq: Every day | ORAL | Status: DC
  Administered 2015-04-28 – 2015-05-07 (×10): 10 mg via ORAL
  Filled 2015-04-27 (×3): qty 1
  Filled 2015-04-27: qty 7
  Filled 2015-04-27 (×4): qty 1
  Filled 2015-04-27: qty 7
  Filled 2015-04-27 (×2): qty 1

## 2015-04-27 MED ORDER — CARVEDILOL 25 MG PO TABS
25.0000 mg | ORAL_TABLET | Freq: Two times a day (BID) | ORAL | Status: DC
  Administered 2015-04-27: 25 mg via ORAL
  Filled 2015-04-27 (×2): qty 1

## 2015-04-27 MED ORDER — ALUM & MAG HYDROXIDE-SIMETH 200-200-20 MG/5ML PO SUSP: 30.0000 mL | ORAL | Status: DC | PRN

## 2015-04-27 MED ORDER — HYDRALAZINE HCL 50 MG PO TABS
50.0000 mg | ORAL_TABLET | Freq: Three times a day (TID) | ORAL | Status: DC
  Administered 2015-04-28 – 2015-05-02 (×14): 50 mg via ORAL
  Filled 2015-04-27 (×19): qty 1

## 2015-04-27 MED ORDER — POTASSIUM CHLORIDE CRYS ER 20 MEQ PO TBCR
20.0000 meq | EXTENDED_RELEASE_TABLET | Freq: Every day | ORAL | Status: DC
  Administered 2015-04-27: 20 meq via ORAL
  Filled 2015-04-27: qty 1

## 2015-04-27 MED ORDER — ACETAMINOPHEN 325 MG PO TABS
650.0000 mg | ORAL_TABLET | Freq: Four times a day (QID) | ORAL | Status: DC | PRN
  Administered 2015-05-01 – 2015-05-02 (×2): 650 mg via ORAL
  Filled 2015-04-27 (×2): qty 2

## 2015-04-27 MED ORDER — METFORMIN HCL ER 500 MG PO TB24
500.0000 mg | ORAL_TABLET | Freq: Every day | ORAL | Status: DC
  Administered 2015-04-28 – 2015-05-07 (×10): 500 mg via ORAL
  Filled 2015-04-27: qty 1
  Filled 2015-04-27 (×2): qty 7
  Filled 2015-04-27 (×8): qty 1

## 2015-04-27 NOTE — ED Notes (Addendum)
Pt oriented to room and unit.  Pt appears very depressed.  Pt contracts for safety  Denies HI and AVH.  15 minute checks and video monitoring continue.  Pt arrived with iv still in place in left forearm.  IV removed and pressure held.  Bandaid applied.

## 2015-04-27 NOTE — BH Assessment (Addendum)
Tele Assessment Note   Jeffrey Marquez is an 55 y.o. male that presents this date by EMS reporting he has thoughts of self harm and tried to overdose on cocaine on 04/27/15 due to ongoing depression. Patient has been currently residing at [facility] for the last two weeks but is unable to answer questions in reference to where he was living prior to [facility]. Patient is a poor historian and mostly nodded "yes" or "no" when assessment was attempted. Patient could not provide any contacts that could assist with collateral information. Patient did report ongoing thoughts of self harm since two days ago due to increased depression and "just doesn't want to live anymore." Patient was slow to respond to this writer's questions and seemed to be sedated. UDS or BAL was unavailable at the time of this assessment. Patient did report that he was a diabetic and suffered from hypertension. Patient stated he has been taking medication for depression but was unable to recall what medication or where he was receiving it from. Patient reports a prior admission early this year at [facility] for S/I and depression. Patient could not recall any other treatment history although prior admission notes state that patient has received services from [facility]. Patient denies any legal or H/I. Patient agrees to a voluntary admission and states if he isn't admitted he "die for sure." Case was staffed with  Darleene Cleaver MD who stated patient met criteria for inpatient admission as appropriate bed placement is investigated. Disposition was also rendered to [facility].       Diagnosis: F33.2 MDD   Past Medical History:  Past Medical History  Diagnosis Date  . Diverticulitis   . Hypertension   . Diverticula of colon 2012  . Diabetes mellitus without complication ([place])   . Obesity   . Drug abuse   . Alcohol abuse     Past Surgical History  Procedure Laterality Date  . Cardiac catheterization N/A 10/18/2014    Procedure: Left Heart Cath  and Coronary Angiography;  Surgeon: Sherren Mocha, MD;  Location: [facility];  Service: Cardiovascular;  Laterality: N/A;    Family History:  Family History  Problem Relation Age of Onset  . Diabetes Mother   . Diabetes Brother     Social History:  reports that he has never smoked. He has never used smokeless tobacco. He reports that he drinks alcohol. He reports that he uses illicit drugs (Cocaine).  Additional Social History:  Alcohol / Drug Use Pain Medications: See MAR Prescriptions: See MAR Over the Counter: See MAR History of alcohol / drug use?: Yes Longest period of sobriety (when/how long): Unknown patient only nods "yes" or "no" Withdrawal Symptoms:  (patient will not verbally respond)  CIWA: CIWA-Ar BP: 135/78 mmHg Pulse Rate: 82 COWS:    PATIENT STRENGTHS: (choose at least two) Capable of independent living Motivation for treatment/growth  Allergies: No Known Allergies  Home Medications:  (Not in a hospital admission)  OB/GYN Status:  No LMP for male patient.  General Assessment Data Location of Assessment: [facility] ED TTS Assessment: In system Is this a Tele or Face-to-Face Assessment?: Face-to-Face Is this an Initial Assessment or a Re-assessment for this encounter?: Initial Assessment Marital status: Divorced [place] name: NA Is patient pregnant?: No Pregnancy Status: No Living Arrangements: Other (Comment) [employer]) Can pt return to current living arrangement?: Yes Admission Status: Voluntary Is patient capable of signing voluntary admission?: Yes Referral Source: Self/Family/Friend Insurance type: None noted  Medical Screening Exam [facility]  Walk-in ONLY) Medical Exam completed: Yes  Crisis Care Plan Living Arrangements: Other (Comment) [facility]) Legal Guardian: Other: (None) Name of Psychiatrist: None noted per patient (Patient was unable to answer questions) Name of Therapist: None  Education Status Is patient currently in  school?: No Current Grade: NA Highest grade of school patient has completed: 12 Name of school: NA Contact person: None noted  Risk to self with the past 6 months Suicidal Ideation: Yes-Currently Present Has patient been a risk to self within the past 6 months prior to admission? : Yes Suicidal Intent: Yes-Currently Present Has patient had any suicidal intent within the past 6 months prior to admission? : No Is patient at risk for suicide?: Yes Suicidal Plan?: Yes-Currently Present Has patient had any suicidal plan within the past 6 months prior to admission? : No Specify Current Suicidal Plan: Patient plans to overdose Access to Means: Yes Specify Access to Suicidal Means: Patient stated he has drugs What has been your use of drugs/alcohol within the last 12 months?: Current Previous Attempts/Gestures: Yes How many times?: 1 Other Self Harm Risks: None Triggers for Past Attempts: Unknown Intentional Self Injurious Behavior: None Family Suicide History: No Recent stressful life event(s): Other (Comment) (loss of housing) Persecutory voices/beliefs?: No Depression: Yes Depression Symptoms: Despondent (current drug use ) Substance abuse history and/or treatment for substance abuse?: Yes Suicide prevention information given to non-admitted patients: Not applicable  Risk to Others within the past 6 months Homicidal Ideation: No Does patient have any lifetime risk of violence toward others beyond the six months prior to admission? : No Thoughts of Harm to Others: No Current Homicidal Intent: No Current Homicidal Plan: No Access to Homicidal Means: No Identified Victim: NA History of harm to others?: No Assessment of Violence: None Noted Violent Behavior Description: NA Does patient have access to weapons?: No Criminal Charges Pending?: No Does patient have a court date: No Is patient on probation?: No  Psychosis Hallucinations: None noted Delusions: None noted  Mental  Status Report Appearance/Hygiene: Unremarkable Eye Contact: Poor Motor Activity: Unremarkable Speech: Soft, Slow, Slurred Level of Consciousness: Sedated Mood: Depressed Affect: Depressed Anxiety Level: Minimal Thought Processes: Irrelevant Judgement: Impaired Orientation: Not oriented Obsessive Compulsive Thoughts/Behaviors: None  Cognitive Functioning Concentration: Decreased Memory: Recent Impaired, Remote Impaired IQ: Average Insight: Poor Impulse Control: Poor Appetite: Fair Weight Loss: 0 Weight Gain: 0 Sleep: Unable to Assess Total Hours of Sleep: 5 Vegetative Symptoms: None  ADLScreening [facility] Assessment Services) Patient's cognitive ability adequate to safely complete daily activities?: Yes Patient able to express need for assistance with ADLs?: Yes Independently performs ADLs?: Yes (appropriate for developmental age)  Prior Inpatient Therapy Prior Inpatient Therapy: Yes Prior Therapy Dates: 2017 Prior Therapy Facilty/Provider(s): [facility] Reason for Treatment: Depression S/I  Prior Outpatient Therapy Prior Outpatient Therapy: Yes Prior Therapy Dates: patient cannot recall Prior Therapy Facilty/Provider(s): [facility] Reason for Treatment: Depression S/I Does patient have an ACCT team?: No Does patient have Intensive In-House Services?  : No Does patient have [facility] services? : No Does patient have P4[facility] services?:  (Not current )  ADL Screening (condition at time of admission) Patient's cognitive ability adequate to safely complete daily activities?: Yes Is the patient deaf or have difficulty hearing?: No Does the patient have difficulty seeing, even when wearing glasses/contacts?: No Does the patient have difficulty concentrating, remembering, or making decisions?: No Patient able to express need for assistance with ADLs?: Yes Does the patient have difficulty dressing or bathing?: No Independently performs ADLs?: Yes (  appropriate for developmental age) Does  the patient have difficulty walking or climbing stairs?: No Weakness of Legs: None Weakness of Arms/Hands: None  Home Assistive Devices/Equipment Home Assistive Devices/Equipment: None  Therapy Consults (therapy consults require a physician order) PT Evaluation Needed: No OT Evalulation Needed: No SLP Evaluation Needed: No Abuse/Neglect Assessment (Assessment to be complete while patient is alone) Physical Abuse: Denies Verbal Abuse: Denies Sexual Abuse: Denies Exploitation of patient/patient's resources: Denies Self-Neglect: Denies Values / Beliefs Cultural Requests During Hospitalization: None Spiritual Requests During Hospitalization: None Consults Spiritual Care Consult Needed: No Social Work Consult Needed: No Regulatory affairs officer (For Healthcare) Does patient have an advance directive?: No Would patient like information on creating an advanced directive?: No - patient declined information (Patient would not answer)    Additional Information 1:1 In Past 12 Months?: No CIRT Risk: No Elopement Risk: No Does patient have medical clearance?: Yes     Disposition: Case was staffed with  Akintayo MD who stated patient met criteria for inpatient admission as appropriate bed placement is investigated. Disposition was also rendered to [facility].        Disposition Initial Assessment Completed for this Encounter: Yes Disposition of Patient: Inpatient treatment program Type of inpatient treatment program: Adult  Mamie Nick 04/27/2015 11:05 AM

## 2015-04-27 NOTE — Tx Team (Signed)
Initial Interdisciplinary Treatment Plan   PATIENT STRESSORS: Financial difficulties Health problems Loss of relationship Occupational concerns Substance abuse   PATIENT STRENGTHS: Ability for insight Communication skills Motivation for treatment/growth   PROBLEM LIST: Problem List/Patient Goals Date to be addressed Date deferred Reason deferred Estimated date of resolution  "Homeless" 04/27/2015  04/27/2015   D/C  "Not making enough money" 04/27/2015  04/27/2015   D/C  "Gave up everything" 04/27/2015  04/27/2015   D/C  "Broke up with girlfriend" 04/27/2015  04/27/2015   D/C  "Overdosed on Crack cocaine" 04/27/2015  04/27/2015                              DISCHARGE CRITERIA:  Ability to meet basic life and health needs Adequate post-discharge living arrangements Improved stabilization in mood, thinking, and/or behavior Motivation to continue treatment in a less acute level of care Need for constant or close observation no longer present Reduction of life-threatening or endangering symptoms to within safe limits  PRELIMINARY DISCHARGE PLAN: Outpatient therapy Placement in alternative living arrangements  PATIENT/FAMIILY INVOLVEMENT: This treatment plan has been presented to and reviewed with the patient, Jeffrey Marquez.  The patient and family have been given the opportunity to ask questions and make suggestions.  Larry SierrasMiddleton, Lavonia Eager P 04/27/2015, 8:39 PM

## 2015-04-27 NOTE — ED Notes (Signed)
Per EMS. Pt used cocaine last night and began to have suicidal thoughts. Pt will not answer any questions during triage. Did nod that he was not having any pain, HI or etoh use.

## 2015-04-27 NOTE — ED Notes (Signed)
Bed: WBH36 Expected date:  Expected time:  Means of arrival:  Comments: TR 3 

## 2015-04-27 NOTE — BH Assessment (Signed)
BHH Assessment Progress Note  Per Thedore MinsMojeed Akintayo, MD, this pt requires psychiatric hospitalization at this time.  Brooke, RN, [facility].  Pt has signed Voluntary Admission and Consent for Treatment, as well as Consent to Release Information, and signed forms [facility].  Pt's nurse, Kendal Hymendie, has been notified, and agrees to send original paperwork along with pt via [facility]uel [place], and to call report to [phone].  Doylene Canninghomas Philbert Ocallaghan, MA Triage Specialist [phone]

## 2015-04-27 NOTE — ED Notes (Signed)
Per EMS pt states he smoked $500 of crack cocaine all night until 0530 today, at which point he began feeling depression and ideation of self-harm.

## 2015-04-27 NOTE — BHH Counselor (Signed)
[facility] per Nehemiah SettleBrooke, RN, [facility]. [facility] [place]. Patients nurse notified of acceptance and request to contact admissions nurse for admission time.   Davina PokeJoVea Ilo Beamon, LCSW Therapeutic Triage Specialist [facility] 04/27/2015 3:06 PM
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
## 2015-04-27 NOTE — ED Provider Notes (Signed)
CSN: [id number]     Arrival date & time 04/27/15  0809 History   First MD Initiated Contact with Patient 04/27/15 [phone]     Chief Complaint  Patient presents with  . Suicidal  . Cocaine      (Consider location/radiation/quality/duration/timing/severity/associated sxs/prior Treatment) HPI Patient presents with depression, suicidal ideation. Patient states that yesterday he had thoughts of overdosing. Yesterday the patient also used approximately $500 worth of cocaine. Today he states that he feels despondent. He denies physical pain, confusion, disorientation per The initial evaluation takes quite a bit of time, as the patient is minimally verbal, largely responding only to questions with a nod.  Past Medical History  Diagnosis Date  . Diverticulitis   . Hypertension   . Diverticula of colon 2012  . Diabetes mellitus without complication (HCC)   . Obesity   . Drug abuse   . Alcohol abuse    Past Surgical History  Procedure Laterality Date  . Cardiac catheterization N/A 10/18/2014    Procedure: Left Heart Cath and Coronary Angiography;  Surgeon: Tonny BollmanMichael Cooper, MD;  [facility];  Service: Cardiovascular;  Laterality: N/A;   Family History  Problem Relation Age of Onset  . Diabetes Mother   . Diabetes Brother    Social History  Substance Use Topics  . Smoking status: Never Smoker   . Smokeless tobacco: Never Used  . Alcohol Use: Yes    Review of Systems  Constitutional:       Per HPI, otherwise negative  HENT:       Per HPI, otherwise negative  Respiratory:       Per HPI, otherwise negative  Cardiovascular:       Per HPI, otherwise negative  Gastrointestinal: Negative for vomiting.  Endocrine:       Negative aside from HPI  Genitourinary:       Neg aside from HPI   Musculoskeletal:       Per HPI, otherwise negative  Skin: Negative.   Neurological: Negative for syncope.  Psychiatric/Behavioral: Positive for suicidal ideas, sleep disturbance,  dysphoric mood and decreased concentration.      Allergies  Review of patient's allergies indicates no known allergies.  Home Medications   Prior to Admission medications   Medication Sig Start Date End Date Taking? Authorizing Provider  atorvastatin (LIPITOR) 10 MG tablet Take 1 tablet (10 mg total) by mouth daily. 02/16/15   Osvaldo ShipperGokul Krishnan, MD  butalbital-acetaminophen-caffeine (FIORICET, ESGIC) 213 354 656450-325-40 MG tablet Take 1-2 tablets by mouth every 4 (four) hours as needed for headache. 02/16/15   Osvaldo ShipperGokul Krishnan, MD  carvedilol (COREG) 25 MG tablet Take 1 tablet (25 mg total) by mouth 2 (two) times daily with a meal. 02/16/15   Osvaldo ShipperGokul Krishnan, MD  guaiFENesin (MUCINEX) 600 MG 12 hr tablet Take 2 tablets (1,200 mg total) by mouth 2 (two) times daily. May purchase over the counter as desired. 02/12/15   Thermon LeylandLaura A Davis, NP  hydrALAZINE (APRESOLINE) 50 MG tablet Take 1 tablet (50 mg total) by mouth 3 (three) times daily. 02/16/15   Osvaldo ShipperGokul Krishnan, MD  metFORMIN (GLUCOPHAGE XR) 500 MG 24 hr tablet Take 1 tablet (500 mg total) by mouth daily with breakfast. 02/16/15   Osvaldo ShipperGokul Krishnan, MD  nitroGLYCERIN (NITROSTAT) 0.4 MG SL tablet Place 1 tablet (0.4 mg total) under the tongue every 5 (five) minutes as needed for chest pain. Patient not taking: Reported on 02/04/2015 10/18/14   Vesta MixerPhilip J Nahser, MD  Potassium Chloride ER 20 MEQ TBCR  Take 20 mEq by mouth daily. 02/16/15   Osvaldo Shipper, MD  sertraline (ZOLOFT) 50 MG tablet Take 1 tablet (50 mg total) by mouth daily. 02/16/15   Osvaldo Shipper, MD  traZODone (DESYREL) 50 MG tablet Take 1 tablet (50 mg total) by mouth at bedtime as needed for sleep. 02/16/15   Osvaldo Shipper, MD  valsartan-hydrochlorothiazide (DIOVAN-HCT) 320-25 MG tablet Take 1 tablet by mouth daily. 02/16/15   Osvaldo Shipper, MD   BP 135/78 mmHg  Pulse 82  Temp(Src) 98.2 F (36.8 C) (Oral)  Resp 18  SpO2 94% Physical Exam  Constitutional: He is oriented to person, place, and time. He appears  well-developed. No distress.  HENT:  Head: Normocephalic and atraumatic.  Eyes: Conjunctivae and EOM are normal.  Cardiovascular: Normal rate and regular rhythm.   Pulmonary/Chest: Effort normal. No stridor. No respiratory distress.  Abdominal: He exhibits no distension.  Musculoskeletal: He exhibits no edema.  Neurological: He is alert and oriented to person, place, and time.  Skin: Skin is warm and dry.  Psychiatric: His speech is delayed. He exhibits a depressed mood. He expresses suicidal ideation. He expresses suicidal plans.  Nursing note and vitals reviewed.   ED Course  Procedures (including critical care time) Labs Review Labs Reviewed  COMPREHENSIVE METABOLIC PANEL  ETHANOL  SALICYLATE LEVEL  ACETAMINOPHEN LEVEL  CBC  URINE RAPID DRUG SCREEN, HOSP PERFORMED    I discussed patient's case with our behavioral health team. Shared insight into his depressed presentation, concern for suicide risk.   MDM  Patient presents after a cocaine binge, now with suicidal ideation, depression. Patient denies physical complaints, has unremarkable vital signs, labs are unremarkable. Patient has been medically cleared for psychiatric evaluation.  Gerhard Munch, MD 04/27/15 [phone]

## 2015-04-27 NOTE — BH Assessment (Signed)
Spoke with patient to assess appropriateness for behavioral health based on past information in his record regarding pending charges.  Patient states that he does not have any pending charges and states that "everything was dismissed." Patient stated that he was charged "14 years ago" for a "probation violation" and then moved to [place]. He states that he moved to [place] without following up with the charges. Patient states "I moved back and it was a warrant for my arrest, I didn't get in no trouble in fourteen years while I was in [place] so the judge dismissed everything,."  Davina PokeJoVea Danell Vazquez, LCSW Therapeutic Triage Specialist  [facility] 04/27/2015 2:15 PM

## 2015-04-27 NOTE — ED Notes (Signed)
Bed: WLPT3 Expected date:  Expected time:  Means of arrival:  Comments: EMS- 55yo M, crack use/depression

## 2015-04-28 DIAGNOSIS — R45851 Suicidal ideations: Secondary | ICD-10-CM

## 2015-04-28 DIAGNOSIS — F1410 Cocaine abuse, uncomplicated: Secondary | ICD-10-CM

## 2015-04-28 DIAGNOSIS — F332 Major depressive disorder, recurrent severe without psychotic features: Principal | ICD-10-CM

## 2015-04-28 LAB — GLUCOSE, CAPILLARY: GLUCOSE-CAPILLARY: 100 mg/dL — AB (ref 65–99)

## 2015-04-28 MED ORDER — SERTRALINE HCL 100 MG PO TABS
100.0000 mg | ORAL_TABLET | Freq: Every day | ORAL | Status: DC
  Administered 2015-04-29 – 2015-05-01 (×3): 100 mg via ORAL
  Filled 2015-04-28 (×5): qty 1

## 2015-04-28 MED ORDER — ISOMETHEPTENE-DICHLORAL-APAP 65-100-325 MG PO CAPS: 1.0000 | ORAL_CAPSULE | ORAL | Status: DC | PRN

## 2015-04-28 MED ORDER — TRAZODONE HCL 100 MG PO TABS
100.0000 mg | ORAL_TABLET | Freq: Every evening | ORAL | Status: DC | PRN
  Administered 2015-04-28 – 2015-04-29 (×2): 100 mg via ORAL
  Filled 2015-04-28 (×2): qty 1

## 2015-04-28 NOTE — Progress Notes (Signed)
Patient's BP low prior to lunch, 1200 apresoline dose. Verified manually (see doc flowsheets). Patient indicates feel weak, slightly dizzy. Pitcher of fluids provided and encouraged. Patient instructed to stay back on unit and tray was provided. Fall precautions in place and reviewed. Patient verbalized understanding. [facility].
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
## 2015-04-28 NOTE — BHH Suicide Risk Assessment (Signed)
[facility] Admission Suicide Risk Assessment   Nursing information obtained from:  Patient Demographic factors:  Male, Divorced or widowed, Low socioeconomic status, Living alone, Unemployed Current Mental Status:  Suicidal ideation indicated by patient, Suicide plan, Self-harm thoughts, Self-harm behaviors, Intention to act on suicide plan, Belief that plan would result in death Loss Factors:  Loss of significant relationship, Financial problems / change in socioeconomic status Historical Factors:  Prior suicide attempts, Family history of mental illness or substance abuse Risk Reduction Factors:  NA  Total Time spent with patient: 1.5 hours Principal Problem: <principal problem not specified> Diagnosis:   Patient Active Problem List   Diagnosis Date Noted  . Cocaine abuse [F14.10] 04/27/2015  . Major depressive disorder, recurrent episode, severe (HCC) [F33.2] 04/27/2015  . Accelerated hypertension [I10]   . Blurry vision [H53.8]   . Hypertensive urgency [I16.0] 02/14/2015  . MDD (major depressive disorder), recurrent severe, without psychosis (HCC) [F33.2] 02/05/2015  . Unstable angina (HCC) [I20.0] 10/18/2014  . Essential hypertension, malignant [I10] 01/10/2014  . DM II (diabetes mellitus, type II), controlled (HCC) [E11.9] 11/27/2013   Subjective Data: sad, depressed. Says i am homeless. Feels chronically depressed and amotivated.  Continued Clinical Symptoms:  Alcohol Use Disorder Identification Test Final Score (AUDIT): 2 The "Alcohol Use Disorders Identification Test", Guidelines for Use in Primary Care, Second Edition.  World Science writer [facility]). Score between 0-7:  no or low risk or alcohol related problems. Score between 8-15:  moderate risk of alcohol related problems. Score between 16-19:  high risk of alcohol related problems. Score 20 or above:  warrants further diagnostic evaluation for alcohol dependence and treatment.   CLINICAL FACTORS:   Depression:   Comorbid  alcohol abuse/dependence Hopelessness Impulsivity Dysthymia Alcohol/Substance Abuse/Dependencies More than one psychiatric diagnosis Unstable or Poor Therapeutic Relationship Previous Psychiatric Diagnoses and Treatments   Musculoskeletal: Strength & Muscle Tone: within normal limits Gait & Station: normal Patient leans: N/A  Psychiatric Specialty Exam: Review of Systems  Constitutional: Negative for fever.  Cardiovascular: Negative for chest pain.  Skin: Negative for rash.  Neurological: Negative for tremors.  Psychiatric/Behavioral: Positive for depression and substance abuse.    Blood pressure 100/58, pulse 78, temperature 99.6 F (37.6 C), temperature source Oral, resp. rate 20, height 5' 9.4" (1.763 m), weight 118.842 kg (262 lb), SpO2 95 %.Body mass index is 38.24 kg/(m^2).  General Appearance: Casual  Eye Contact::  Fair  Speech:  Slow  Volume:  Decreased  Mood:  Dysphoric  Affect:  Congruent  Thought Process:  Coherent  Orientation:  Full (Time, Place, and Person)  Thought Content:  Rumination  Suicidal Thoughts:  Yes.  without intent/plan  Homicidal Thoughts:  No  Memory:  Immediate;   Fair Recent;   Fair  Judgement:  Poor  Insight:  Shallow  Psychomotor Activity:  Decreased  Concentration:  Fair  Recall:  [employer] of Knowledge:Fair  Language: Fair  Akathisia:  Negative  Handed:  Right  AIMS (if indicated):     Assets:  Desire for Improvement Others:  poor coping skills  Sleep:  Number of Hours: 6.5  Cognition: WNL  ADL's:  Intact    COGNITIVE FEATURES THAT CONTRIBUTE TO RISK:  Closed-mindedness    SUICIDE RISK:   Moderate:  Frequent suicidal ideation with limited intensity, and duration, some specificity in terms of plans, no associated intent, good self-control, limited dysphoria/symptomatology, some risk factors present, and identifiable protective factors, including available and accessible social support.  PLAN OF CARE: Admit for  stabilization and safety. Medication management. Social services to work on long term plan.  I certify that inpatient services furnished can reasonably be expected to improve the patient's condition.   Thresa RossAKHTAR, Aubria Vanecek, MD 04/28/2015, 10:17 AM

## 2015-04-28 NOTE — H&P (Signed)
Psychiatric Admission Assessment Adult  Patient Identification: Jeffrey Marquez MRN:  [id number] Date of Evaluation:  04/28/2015 Chief Complaint:  MDD RECURRENT SEVERE Principal Diagnosis: Major depressive disorder, recurrent episode, severe ([place]) Diagnosis:   Patient Active Problem List   Diagnosis Date Noted  . Cocaine abuse [F14.10] 04/27/2015  . Major depressive disorder, recurrent episode, severe ([place]) [F33.2] 04/27/2015  . Accelerated hypertension [I10]   . Blurry vision [H53.8]   . Hypertensive urgency [I16.0] 02/14/2015  . MDD (major depressive disorder), recurrent severe, without psychosis ([place]) [F33.2] 02/05/2015  . Unstable angina ([place]) [I20.0] 10/18/2014  . Essential hypertension, malignant [I10] 01/10/2014  . DM II (diabetes mellitus, type II), controlled ([place]) [E11.9] 11/27/2013   History of Present Illness: PER Tele Assessment-[name] is an 55 y.o. male that presents this date by EMS reporting he has thoughts of self harm and tried to overdose on cocaine on 04/27/15 due to ongoing depression. Patient has been currently residing at [facility] for the last two weeks but is unable to answer questions in reference to where he was living prior to [facility]. Patient is a poor historian and mostly nodded "yes" or "no" when assessment was attempted. Patient could not provide any contacts that could assist with collateral information. Patient did report ongoing thoughts of self harm since two days ago due to increased depression and "just doesn't want to live anymore." Patient was slow to respond to this writer's questions and seemed to be sedated. UDS or BAL was unavailable at the time of this assessment. Patient did report that he was a diabetic and suffered from hypertension. Patient stated he has been taking medication for depression but was unable to recall what medication or where he was receiving it from. Patient reports a prior admission early this year at [facility] for S/I and  depression. Patient could not recall any other treatment history although prior admission notes state that patient has received services from [facility]. Patient denies any legal or H/I. Patient agrees to a voluntary admission and states if he isn't admitted he "die for sure." Case was staffed with Darleene Cleaver MD who stated patient met criteria for inpatient admission as appropriate bed placement is investigated. Disposition was also rendered to [place]:  On Evaluation:Jeffrey Marquez is awake, alert and oriented X4 , found sitting in bedroom sitting on bench. Reports suicidal ideation with a plan to walk into traffic. Patient denies homicidal ideation. Denies auditory or visual hallucination and does not appear to be responding to internal stimuli.Patient reports he is medication compliant without missing doses denies mediation side effects.  Reports that his medications are not helping.  States his depression 8/10. Reports last inpatient was January/2017  Patient reports fair appetite and states he is not resting well. Support, encouragement and reassurance was provided.   Associated Signs/Symptoms: Depression Symptoms:  depressed mood, feelings of worthlessness/guilt, difficulty concentrating, hopelessness, suicidal thoughts with specific plan, anxiety, (Hypo) Manic Symptoms:  Hallucinations, Anxiety Symptoms:  Excessive Worry, Social Anxiety, Psychotic Symptoms:  Hallucinations: None PTSD Symptoms: Avoidance:  Decreased Interest/Participation Total Time spent with patient: 30 minutes  Past Psychiatric History: See Above  Is the patient at risk to self? Yes.    Has the patient been a risk to self in the past 6 months? Yes.    Has the patient been a risk to self within the distant past? Yes.    Is the patient a risk to others? No.  Has the patient been a risk to others in the past 6  months? No.  Has the patient been a risk to others within the distant past? No.   Prior Inpatient Therapy:    Prior Outpatient Therapy:    Alcohol Screening: 1. How often do you have a drink containing alcohol?: 2 to 4 times a month 2. How many drinks containing alcohol do you have on a typical day when you are drinking?: 1 or 2 3. How often do you have six or more drinks on one occasion?: Never Preliminary Score: 0 9. Have you or someone else been injured as a result of your drinking?: No 10. Has a relative or friend or a doctor or another health worker been concerned about your drinking or suggested you cut down?: No Alcohol Use Disorder Identification Test Final Score (AUDIT): 2 Brief Intervention: AUDIT score less than 7 or less-screening does not suggest unhealthy drinking-brief intervention not indicated Substance Abuse History in the last 12 months:  Yes.   Consequences of Substance Abuse: Withdrawal Symptoms:   None Previous Psychotropic Medications: YES Psychological Evaluations: YES Past Medical History:  Past Medical History  Diagnosis Date  . Diverticulitis   . Hypertension   . Diverticula of colon 2012  . Diabetes mellitus without complication ([place])   . Obesity   . Drug abuse   . Alcohol abuse     Past Surgical History  Procedure Laterality Date  . Cardiac catheterization N/A 10/18/2014    Procedure: Left Heart Cath and Coronary Angiography;  Surgeon: Sherren Mocha, MD;  Location: [facility];  Service: Cardiovascular;  Laterality: N/A;   Family History:  Family History  Problem Relation Age of Onset  . Diabetes Mother   . Diabetes Brother    Family Psychiatric  History: UNKNOWN Tobacco Screening: @FLOW ([phone])::1)@ Social History:  History  Alcohol Use  . Yes     History  Drug Use  . Yes  . Special: Cocaine    Additional Social History: Marital status: Divorced What is your sexual orientation?: heterosexual Does patient have children?: Yes How many children?: 1 How is patient's relationship with their children?: adult daughter - pt reports  being close to her                         Allergies:  No Known Allergies Lab Results:  Results for orders placed or performed during the hospital encounter of 04/27/15 (from the past 48 hour(s))  Comprehensive metabolic panel     Status: Abnormal   Collection Time: 04/27/15 10:00 AM  Result Value Ref Range   Sodium 140 135 - 145 mmol/L   Potassium 3.7 3.5 - 5.1 mmol/L   Chloride 101 101 - 111 mmol/L   CO2 27 22 - 32 mmol/L   Glucose, Bld 140 (H) 65 - 99 mg/dL   BUN 13 6 - 20 mg/dL   Creatinine, Ser 1.20 0.61 - 1.24 mg/dL   Calcium 9.3 8.9 - 10.3 mg/dL   Total Protein 7.4 6.5 - 8.1 g/dL   Albumin 4.1 3.5 - 5.0 g/dL   AST 17 15 - 41 U/L   ALT 17 17 - 63 U/L   Alkaline Phosphatase 79 38 - 126 U/L   Total Bilirubin 1.1 0.3 - 1.2 mg/dL   GFR calc non Af Amer >60 >60 mL/min   GFR calc Af Amer >60 >60 mL/min    Comment: (NOTE) The eGFR has been calculated using the CKD EPI equation. This calculation has not been validated in all clinical situations. eGFR's  persistently <60 mL/min signify possible Chronic Kidney Disease.    Anion gap 12 5 - 15  Ethanol (ETOH)     Status: None   Collection Time: 04/27/15 10:00 AM  Result Value Ref Range   Alcohol, Ethyl (B) <5 <5 mg/dL    Comment:        LOWEST DETECTABLE LIMIT FOR SERUM ALCOHOL IS 5 mg/dL FOR MEDICAL PURPOSES ONLY   Salicylate level     Status: None   Collection Time: 04/27/15 10:00 AM  Result Value Ref Range   Salicylate Lvl <7.6 2.8 - 30.0 mg/dL  Acetaminophen level     Status: Abnormal   Collection Time: 04/27/15 10:00 AM  Result Value Ref Range   Acetaminophen (Tylenol), Serum <10 (L) 10 - 30 ug/mL    Comment:        THERAPEUTIC CONCENTRATIONS VARY SIGNIFICANTLY. A RANGE OF 10-30 ug/mL MAY BE AN EFFECTIVE CONCENTRATION FOR MANY PATIENTS. HOWEVER, SOME ARE BEST TREATED AT CONCENTRATIONS OUTSIDE THIS RANGE. ACETAMINOPHEN CONCENTRATIONS >150 ug/mL AT 4 HOURS AFTER INGESTION AND >50 ug/mL AT 12 HOURS  AFTER INGESTION ARE OFTEN ASSOCIATED WITH TOXIC REACTIONS.   CBC     Status: Abnormal   Collection Time: 04/27/15 10:00 AM  Result Value Ref Range   WBC 11.5 (H) 4.0 - 10.5 K/uL   RBC 4.89 4.22 - 5.81 MIL/uL   Hemoglobin 14.5 13.0 - 17.0 g/dL   HCT 42.9 39.0 - 52.0 %   MCV 87.7 78.0 - 100.0 fL   MCH 29.7 26.0 - 34.0 pg   MCHC 33.8 30.0 - 36.0 g/dL   RDW 13.9 11.5 - 15.5 %   Platelets 229 150 - 400 K/uL  Urine rapid drug screen (hosp performed) (Not at [facility])     Status: Abnormal   Collection Time: 04/27/15  1:41 PM  Result Value Ref Range   Opiates NONE DETECTED NONE DETECTED   Cocaine POSITIVE (A) NONE DETECTED   Benzodiazepines NONE DETECTED NONE DETECTED   Amphetamines NONE DETECTED NONE DETECTED   Tetrahydrocannabinol NONE DETECTED NONE DETECTED   Barbiturates NONE DETECTED NONE DETECTED    Comment:        DRUG SCREEN FOR MEDICAL PURPOSES ONLY.  IF CONFIRMATION IS NEEDED FOR ANY PURPOSE, NOTIFY LAB WITHIN 5 DAYS.        LOWEST DETECTABLE LIMITS FOR URINE DRUG SCREEN Drug Class       Cutoff (ng/mL) Amphetamine      1000 Barbiturate      200 Benzodiazepine   195 Tricyclics       093 Opiates          300 Cocaine          300 THC              50     Blood Alcohol level:  Lab Results  Component Value Date   ETH <5 04/27/2015   ETH <5 26/71/2458    Metabolic Disorder Labs:  Lab Results  Component Value Date   HGBA1C 6.2* 02/14/2015   MPG 131 02/14/2015   MPG 140 02/08/2015   No results found for: PROLACTIN Lab Results  Component Value Date   CHOL 235* 11/18/2013   TRIG 306* 11/18/2013   HDL 35* 11/18/2013   CHOLHDL 6.7 11/18/2013   VLDL 61* 11/18/2013   LDLCALC 139* 11/18/2013    Current Medications: Current Facility-Administered Medications  Medication Dose Route Frequency Provider Last Rate Last Dose  . acetaminophen (TYLENOL) tablet 650 mg  650 mg  Oral Q6H PRN Patrecia Pour, NP      . alum & mag hydroxide-simeth (MAALOX/MYLANTA) 200-200-20  MG/5ML suspension 30 mL  30 mL Oral Q4H PRN Patrecia Pour, NP      . atorvastatin (LIPITOR) tablet 10 mg  10 mg Oral Daily Patrecia Pour, NP   10 mg at 04/28/15 6734  . butalbital-acetaminophen-caffeine (FIORICET, ESGIC) 50-325-40 MG per tablet 1-2 tablet  1-2 tablet Oral Q4H PRN Patrecia Pour, NP      . carvedilol (COREG) tablet 25 mg  25 mg Oral BID WC Patrecia Pour, NP   25 mg at 04/28/15 0903  . hydrALAZINE (APRESOLINE) tablet 50 mg  50 mg Oral TID Patrecia Pour, NP   50 mg at 04/28/15 1937  . irbesartan (AVAPRO) tablet 300 mg  300 mg Oral Daily Patrecia Pour, NP   300 mg at 04/28/15 9024   And  . hydrochlorothiazide (HYDRODIURIL) tablet 25 mg  25 mg Oral Daily Patrecia Pour, NP   25 mg at 04/28/15 0973  . LORazepam (ATIVAN) tablet 1 mg  1 mg Oral Q8H PRN Patrecia Pour, NP      . magnesium hydroxide (MILK OF MAGNESIA) suspension 30 mL  30 mL Oral Daily PRN Patrecia Pour, NP      . metFORMIN (GLUCOPHAGE-XR) 24 hr tablet 500 mg  500 mg Oral Q breakfast Patrecia Pour, NP   500 mg at 04/28/15 5329  . nitroGLYCERIN (NITROSTAT) SL tablet 0.4 mg  0.4 mg Sublingual Q5 min PRN Patrecia Pour, NP      . potassium chloride SA (K-DUR,KLOR-CON) CR tablet 20 mEq  20 mEq Oral Daily Patrecia Pour, NP   20 mEq at 04/28/15 0904  . [START ON 04/29/2015] sertraline (ZOLOFT) tablet 100 mg  100 mg Oral Daily Derrill Center, NP      . traZODone (DESYREL) tablet 100 mg  100 mg Oral QHS PRN Derrill Center, NP       PTA Medications: Prescriptions prior to admission  Medication Sig Dispense Refill Last Dose  . atorvastatin (LIPITOR) 10 MG tablet Take 1 tablet (10 mg total) by mouth daily. 30 tablet 5 Past Week at Unknown time  . butalbital-acetaminophen-caffeine (FIORICET, ESGIC) 50-325-40 MG tablet Take 1-2 tablets by mouth every 4 (four) hours as needed for headache. 30 tablet 0 unknown  . carvedilol (COREG) 25 MG tablet Take 1 tablet (25 mg total) by mouth 2 (two) times daily with a meal. 60 tablet 0  04/26/2015 at 1930  . guaiFENesin (MUCINEX) 600 MG 12 hr tablet Take 2 tablets (1,200 mg total) by mouth 2 (two) times daily. May purchase over the counter as desired. (Patient not taking: Reported on 04/27/2015)   Completed Course at Unknown time  . hydrALAZINE (APRESOLINE) 50 MG tablet Take 1 tablet (50 mg total) by mouth 3 (three) times daily. 90 tablet 0 04/26/2015 at Unknown time  . metFORMIN (GLUCOPHAGE XR) 500 MG 24 hr tablet Take 1 tablet (500 mg total) by mouth daily with breakfast. 30 tablet 0 04/26/2015 at Unknown time  . nitroGLYCERIN (NITROSTAT) 0.4 MG SL tablet Place 1 tablet (0.4 mg total) under the tongue every 5 (five) minutes as needed for chest pain. 25 tablet 6 unknown  . Potassium Chloride ER 20 MEQ TBCR Take 20 mEq by mouth daily. 30 tablet 0 04/26/2015 at Unknown time  . sertraline (ZOLOFT) 50 MG tablet Take 1 tablet (50 mg total) by mouth  daily. 30 tablet 0 04/26/2015 at Unknown time  . traZODone (DESYREL) 50 MG tablet Take 1 tablet (50 mg total) by mouth at bedtime as needed for sleep. 30 tablet 0 Past Week at Unknown time  . valsartan-hydrochlorothiazide (DIOVAN-HCT) 320-25 MG tablet Take 1 tablet by mouth daily. 30 tablet 0 04/26/2015 at Unknown time    Musculoskeletal: Strength & Muscle Tone: within normal limits Gait & Station: normal Patient leans: N/A  Psychiatric Specialty Exam: Physical Exam  Nursing note and vitals reviewed. Constitutional: He is oriented to person, place, and time. He appears well-developed.  HENT:  Head: Normocephalic and atraumatic.  Neck: Normal range of motion. Neck supple.  Cardiovascular: Normal rate.   Musculoskeletal: Normal range of motion.  Neurological: He is alert and oriented to person, place, and time.  Skin: Skin is warm and dry.  Psychiatric: He has a normal mood and affect. His behavior is normal. Thought content normal.    Review of Systems  Psychiatric/Behavioral: Positive for depression, suicidal ideas and substance abuse.  Negative for hallucinations. The patient is nervous/anxious and has insomnia.   All other systems reviewed and are negative.   Blood pressure 124/74, pulse 78, temperature 98.5 F (36.9 C), temperature source Oral, resp. rate 20, height 5' 9.4" (1.763 m), weight 118.842 kg (262 lb), SpO2 95 %.Body mass index is 38.24 kg/(m^2).  General Appearance: Disheveled  Eye Contact::  Minimal  Speech:  Clear and Coherent  Volume:  Decreased  Mood:  Anxious and Depressed  Affect:  Congruent  Thought Process:  Linear  Orientation:  Full (Time, Place, and Person)  Thought Content:  Hallucinations: None  Suicidal Thoughts:  Yes.  with intent/plan  Homicidal Thoughts:  No  Memory:  Immediate;   Fair Recent;   Fair Remote;   Fair  Judgement:  Intact  Insight:  Fair  Psychomotor Activity:  Restlessness  Concentration:  Fair  Recall:  [employer] [place]: Fair  Akathisia:  No  Handed:  Right  AIMS (if indicated):     Assets:  Resilience Social Support  ADL's:  Intact  Cognition: WNL  Sleep:  Number of Hours: 6.5    I agree with current treatment plan on 04/28/2015, Patient seen face-to-face for psychiatric evaluation follow-up, chart reviewed and case discussed with the MD De Nurse. Reviewed the information documented and agree with the treatment plan.  Treatment Plan Summary: Daily contact with patient to assess and evaluate symptoms and progress in treatment and Medication management   Increased Zoloft 38m to 1087m for mood stabilization. Continue with Trazodone 100 mg for insomnia Will continue to monitor vitals ,medication compliance and treatment side effects while patient is here.  Reviewed labs Glucose 140 elevated ,BAL - 0, UDS - pos for cocaine. CSW will start working on disposition.  Patient to participate in therapeutic milieu  Observation Level/Precautions:  15 minute checks  Laboratory:  CBC Chemistry Profile HbAIC UA  Psychotherapy:  Individual and  group session  Medications:  increased Zoloft 50 mg to 10068m Consultations:  Psychiatry  Discharge Concerns:  Safety, stabilization, and risk of access to medication and medication stabilization   Estimated LOS: 5-7 days  Other:     I certify that inpatient services furnished can reasonably be expected to improve the patient's condition.    TanDerrill CenterP 3/25/201711:30 AM I have examined the patient and agreed with the findings of H&P and treatment plan. I have also done suicide assessment on this patient.

## 2015-04-28 NOTE — Progress Notes (Signed)
[name] has been isolative, quiet and laying in his bed ( in between meals) all day. He avoids eye contact. He is flat, sad and shows no emotion. A   He completed his daily assessment and on it he wrote he HAS experienced SI today...but he makes a Engineer, manufacturingverbal contract with this nurse.. stay safe and NOT respond to these feelings. A he rated his depression, hopelessness and anxiety " 8/10/  ", respectively. HE does not attend groups and / or go to the cafe, rather he asks staff to bring his meals back. R He is quiet all day. He requests to be given trazadone for sleep to night.  Safety is in place.

## 2015-04-28 NOTE — Progress Notes (Signed)
Adult Psychoeducational Group Note  Date:  04/28/2015 Time:  8:57 PM  Group Topic/Focus:  Wrap-Up Group:   The focus of this group is to help patients review their daily goal of treatment and discuss progress on daily workbooks.  Participation Level:  Did Not Attend  Additional Comments:  Pt was in his room resting.  Jeffrey Marquez, Jeffrey Marquez 04/28/2015, 8:57 PM

## 2015-04-28 NOTE — [facility] Group Notes (Signed)
[facility]  04/28/2015 10:00 AM   Type of Therapy:  Group Therapy  Participation Level:  Did Not Attend  Jeanelle MallingChelsea Tanicia Wolaver, LCSW 04/28/2015 1:43 PM

## 2015-04-29 LAB — GLUCOSE, CAPILLARY
GLUCOSE-CAPILLARY: 228 mg/dL — AB (ref 65–99)
Glucose-Capillary: 110 mg/dL — ABNORMAL HIGH (ref 65–99)

## 2015-04-29 NOTE — [facility] Group Notes (Signed)
[facility] Group Notes:  (Nursing/MHT/Case Management/Adjunct)  Date:  04/29/2015  Time:  4:18 PM  Type of Therapy:  Psychoeducational Skills  Participation Level:  Active  Participation Quality:  Appropriate  Affect:  Flat  Cognitive:  Appropriate  Insight:  Appropriate  Engagement in Group:  Engaged  Modes of Intervention:  Discussion and Education  Summary of Progress/Problems: Patient attended group and received daily workbook.   Marzetta BoardDopson, Haleigh Desmith E 04/29/2015, 4:18 PM

## 2015-04-29 NOTE — Progress Notes (Signed)
Adult Psychoeducational Group Note  Date:  04/29/2015 Time:  9:14 PM  Group Topic/Focus:  Wrap-Up Group:   The focus of this group is to help patients review their daily goal of treatment and discuss progress on daily workbooks.  Participation Level:  Minimal  Participation Quality:  Resistant  Affect:  Depressed  Cognitive:  Appropriate  Insight: Appropriate  Engagement in Group:  Limited  Modes of Intervention:  Discussion  Additional Comments:  Pts played a therapeutic activity of Wellness Jeopardy  Caswell CorwinOwen, Tani Virgo C 04/29/2015, 9:14 PM

## 2015-04-29 NOTE — Progress Notes (Signed)
Writer observed patient up in the dayroom tonight watching tv but non interaction with peers. Writer praised him for being out of his room more today and this evening. He attended group tonight and currently denies si/hi/a/v hallucinations. Writer informed him of medications available if needed. Support given and safety maintained on unit with 15 min checks.

## 2015-04-29 NOTE — Plan of Care (Signed)
Problem: Diagnosis: Increased Risk For Suicide Attempt Goal: STG-Patient Will Comply With Medication Regime Outcome: Progressing [name] is compliant at this time.

## 2015-04-29 NOTE — Progress Notes (Signed)
[facility]  04/29/2015 11:20 AM Jeffrey CorollaGary Marquez  MRN:  [id number] Subjective:  Patient reports ' I am feeling okay, I am still depressed"  Objective:Jeffrey Marquez is awake, alert and oriented X3 , found resting in bedroom.  Denies suicidal or homicidal ideation at this time, however reports on and off thought to walk into traffic. Denies auditory or visual hallucination and does not appear to be responding to internal stimuli. Patient interacts well with staff and others. Patient reports he is medication compliant without mediation side effects. Patient encouraged to attend group sessions today. States his depression 8/10. Reports a fair appetite and states he is resting well. Support, encouragement and reassurance was provided.   Principal Problem: Major depressive disorder, recurrent episode, severe (HCC) Diagnosis:   Patient Active Problem List   Diagnosis Date Noted  . Cocaine abuse [F14.10] 04/27/2015  . Major depressive disorder, recurrent episode, severe (HCC) [F33.2] 04/27/2015  . Accelerated hypertension [I10]   . Blurry vision [H53.8]   . Hypertensive urgency [I16.0] 02/14/2015  . MDD (major depressive disorder), recurrent severe, without psychosis (HCC) [F33.2] 02/05/2015  . Unstable angina (HCC) [I20.0] 10/18/2014  . Essential hypertension, malignant [I10] 01/10/2014  . DM II (diabetes mellitus, type II), controlled (HCC) [E11.9] 11/27/2013   Total Time spent with patient: 30 minutes  Past Psychiatric History: See Above  Past Medical History:  Past Medical History  Diagnosis Date  . Diverticulitis   . Hypertension   . Diverticula of colon 2012  . Diabetes mellitus without complication (HCC)   . Obesity   . Drug abuse   . Alcohol abuse     Past Surgical History  Procedure Laterality Date  . Cardiac catheterization N/A 10/18/2014    Procedure: Left Heart Cath and Coronary Angiography;  Surgeon: Tonny BollmanMichael Cooper, MD;  [facility];  Service:  Cardiovascular;  Laterality: N/A;   Family History:  Family History  Problem Relation Age of Onset  . Diabetes Mother   . Diabetes Brother    Family Psychiatric  History: Unknown Social History:  History  Alcohol Use  . Yes     History  Drug Use  . Yes  . Special: Cocaine    Social History   Social History  . Marital Status: Divorced    Spouse Name: N/A  . Number of Children: N/A  . Years of Education: N/A   Social History Main Topics  . Smoking status: Never Smoker   . Smokeless tobacco: Never Used  . Alcohol Use: Yes  . Drug Use: Yes    Special: Cocaine  . Sexual Activity: Not Asked   Other Topics Concern  . None   Social History Narrative   Additional Social History:                         Sleep: Fair  Appetite:  Fair  Current Medications: Current Facility-Administered Medications  Medication Dose Route Frequency Provider Last Rate Last Dose  . acetaminophen (TYLENOL) tablet 650 mg  650 mg Oral Q6H PRN Charm RingsJamison Y Lord, NP      . alum & mag hydroxide-simeth (MAALOX/MYLANTA) 200-200-20 MG/5ML suspension 30 mL  30 mL Oral Q4H PRN Charm RingsJamison Y Lord, NP      . atorvastatin (LIPITOR) tablet 10 mg  10 mg Oral Daily Charm RingsJamison Y Lord, NP   10 mg at 04/29/15 0859  . carvedilol (COREG) tablet 25 mg  25 mg Oral BID WC Charm RingsJamison Y Lord, NP  25 mg at 04/29/15 0859  . hydrALAZINE (APRESOLINE) tablet 50 mg  50 mg Oral TID Charm Rings, NP   50 mg at 04/29/15 0900  . irbesartan (AVAPRO) tablet 300 mg  300 mg Oral Daily Charm Rings, NP   300 mg at 04/29/15 1610   And  . hydrochlorothiazide (HYDRODIURIL) tablet 25 mg  25 mg Oral Daily Charm Rings, NP   25 mg at 04/29/15 0900  . isometheptene-acetaminophen-dichloralphenazone (MIDRIN) capsule 1 capsule  1 capsule Oral Q1H PRN Court Joy, PA-C      . LORazepam (ATIVAN) tablet 1 mg  1 mg Oral Q8H PRN Charm Rings, NP   1 mg at 04/28/15 2020  . magnesium hydroxide (MILK OF MAGNESIA) suspension 30 mL  30 mL  Oral Daily PRN Charm Rings, NP      . metFORMIN (GLUCOPHAGE-XR) 24 hr tablet 500 mg  500 mg Oral Q breakfast Charm Rings, NP   500 mg at 04/29/15 0859  . nitroGLYCERIN (NITROSTAT) SL tablet 0.4 mg  0.4 mg Sublingual Q5 min PRN Charm Rings, NP      . potassium chloride SA (K-DUR,KLOR-CON) CR tablet 20 mEq  20 mEq Oral Daily Charm Rings, NP   20 mEq at 04/29/15 0900  . sertraline (ZOLOFT) tablet 100 mg  100 mg Oral Daily Oneta Rack, NP   100 mg at 04/29/15 0859  . traZODone (DESYREL) tablet 100 mg  100 mg Oral QHS PRN Oneta Rack, NP   100 mg at 04/28/15 2150    Lab Results:  Results for orders placed or performed during the hospital encounter of 04/27/15 (from the past 48 hour(s))  Glucose, capillary     Status: Abnormal   Collection Time: 04/28/15  4:54 PM  Result Value Ref Range   Glucose-Capillary 100 (H) 65 - 99 mg/dL   Comment 1 Notify RN    Comment 2 Document in Chart   Glucose, capillary     Status: Abnormal   Collection Time: 04/29/15  7:30 AM  Result Value Ref Range   Glucose-Capillary 228 (H) 65 - 99 mg/dL   Comment 1 Notify RN     Blood Alcohol level:  Lab Results  Component Value Date   ETH <5 04/27/2015   ETH <5 02/04/2015    Physical Findings: AIMS: Facial and Oral Movements Muscles of Facial Expression: None, normal Lips and Perioral Area: None, normal Jaw: None, normal Tongue: None, normal,Extremity Movements Upper (arms, wrists, hands, fingers): None, normal Lower (legs, knees, ankles, toes): None, normal, Trunk Movements Neck, shoulders, hips: None, normal, Overall Severity Severity of abnormal movements (highest score from questions above): None, normal Incapacitation due to abnormal movements: None, normal Patient's awareness of abnormal movements (rate only patient's report): No Awareness, Dental Status Current problems with teeth and/or dentures?: No Does patient usually wear dentures?: No  CIWA:    COWS:      Musculoskeletal: Strength & Muscle Tone: within normal limits Gait & Station: normal Patient leans: Right  Psychiatric Specialty Exam: Review of Systems  Psychiatric/Behavioral: Positive for depression, suicidal ideas and substance abuse. Negative for hallucinations. The patient is nervous/anxious and has insomnia.   All other systems reviewed and are negative.   Blood pressure 146/69, pulse 63, temperature 98.1 F (36.7 C), temperature source Oral, resp. rate 18, height 5' 9.4" (1.763 m), weight 118.842 kg (262 lb), SpO2 95 %.Body mass index is 38.24 kg/(m^2).  General Appearance: Disheveled and Guarded  Eye Contact::  Fair  Speech:  Clear and Coherent  Volume:  Decreased  Mood:  Depressed  Affect:  Depressed and Flat  Thought Process:  Linear  Orientation:  Full (Time, Place, and Person)  Thought Content:  Rumination  Suicidal Thoughts:  No  Homicidal Thoughts:  No  Memory:  Immediate;   Fair Recent;   Fair Remote;   Fair  Judgement:  Fair  Insight:  Fair  Psychomotor Activity:  Restlessness  Concentration:  Fair  Recall:  [employer] of Knowledge:Fair  Language: Fair  Akathisia:  No  Handed:  Right  AIMS (if indicated):     Assets:  Housing Social Support  ADL's:  Intact  Cognition: WNL  Sleep:  Number of Hours: 6.5     I agree with current treatment plan on 04/29/2015 Patient seen face-to-face for psychiatric evaluation follow-up, chart reviewed. Advanced Practice Provider and Treatment team. Reviewed the information documented and agree with the treatment plan.   Treatment Plan Summary: Daily contact with patient to assess and evaluate symptoms and progress in treatment and Medication management Continue Zoloft s for mood stabilization. Continue with Trazodone 100 mg for insomnia Will continue to monitor vitals ,medication compliance and treatment side effects while patient is here.  Reviewed labs Glucose 140 elevated ,BAL - 0, UDS - pos for  cocaine. CSW will start working on disposition.  Patient to participate in therapeutic milieu   Oneta Rack, NP 04/29/2015, 11:20 AM I agreed with findings and treatment plan of this patient

## 2015-04-29 NOTE — Progress Notes (Signed)
Patient ID: Jeffrey CorollaGary Wren, male   DOB: 21-Jan-1961, 55 y.o.   MRN: [id number]  DAR: Pt. Denies HI and A/V Hallucinations. He reports passive SI and is able to contract for safety. He reports sleep is fair, appetite is fair, energy level is low, and concentration is "jumping." He rates his depression 8/10, hopelessness 9/10, and anxiety is 8/10. Per report from night shift patient scratched right wrist. This area is open to air with no apparent issues. Patient does not report any pain or discomfort at this time. Support and encouragement provided to the patient. Scheduled medications administered to patient per physician's orders. Patient is minimal and stayed in bed throughout the morning. He is seen in the milieu later in the day. Q15 minute checks are maintained for safety.

## 2015-04-29 NOTE — BHH Group Notes (Signed)
04/29/2015  10:00 AM   Type of Therapy and Topic: Group Therapy: Developing Self Esteem and Improving Support System   Participation Level: Patient did not attend group after prompting by MHT.    Annaya Bangert J Stacie Templin MSW, LCSW 

## 2015-04-29 NOTE — Progress Notes (Signed)
Writer was approached by MHT on the hall and was shown a fork that the patient had used to make superficial scratches on his wrist. Patient came to nursing station at writers request so that it could be cleaned up. Writer asked why he did not let a staff member know so that we could talk about his thoughts and feelings he had no response. He reported that he did not want to live anymore but was able to contract for safety with Clinical research associatewriter. Writer [facility] after patient received prn ativan and verbally contracted to let staff know if thoughts intensified. Safety maintained with 15 min checks.

## 2015-04-30 DIAGNOSIS — I2510 Atherosclerotic heart disease of native coronary artery without angina pectoris: Secondary | ICD-10-CM

## 2015-04-30 LAB — GLUCOSE, CAPILLARY
GLUCOSE-CAPILLARY: 105 mg/dL — AB (ref 65–99)
Glucose-Capillary: 129 mg/dL — ABNORMAL HIGH (ref 65–99)

## 2015-04-30 MED ORDER — ASPIRIN EC 81 MG PO TBEC
81.0000 mg | DELAYED_RELEASE_TABLET | Freq: Every day | ORAL | Status: DC
  Administered 2015-04-30 – 2015-05-07 (×8): 81 mg via ORAL
  Filled 2015-04-30 (×2): qty 1
  Filled 2015-04-30: qty 7
  Filled 2015-04-30 (×4): qty 1
  Filled 2015-04-30: qty 7
  Filled 2015-04-30 (×3): qty 1

## 2015-04-30 NOTE — B[facility] Group Notes (Signed)
   [facility]  04/30/2015  8:45 AM   Participation Quality: Alert, Appropriate and Oriented  Mood/Affect: Depressed and Flat  Depression Rating: 9  Anxiety Rating: 8  Thoughts of Suicide: Pt denies SI/HI  Will you contract for safety? Yes  Current AVH: Pt denies  Plan for Discharge/Comments: Pt attended discharge planning group and actively participated in group. CSW provided pt with today's workbook. Patient is homeless and has been staying at a local shelter. He plans to follow up with outpatient services.   Transportation Means: Pt reports access to transportation  Supports: No supports mentioned at this time  Samuella BruinKristin Alejandra Barna, MSW, [employer] JohnsonLCSW Clinical Social Worker [facility] [phone]

## 2015-04-30 NOTE — Progress Notes (Signed)
Adult Psychoeducational Group Note  Date:  04/30/2015 Time:  8:20 PM  Group Topic/Focus:  Wrap-Up Group:   The focus of this group is to help patients review their daily goal of treatment and discuss progress on daily workbooks.  Participation Level:  Did Not Attend  Pt was in bed during wrap-up group.    Cleotilde NeerJasmine S Unity Luepke 04/30/2015, 8:55 PM

## 2015-04-30 NOTE — Progress Notes (Signed)
Patient ID: Jeffrey Marquez, male   DOB: [birth year], 55 y.o.   MRN: [id number] [facility] MD Progress Note  04/30/2015 3:07 PM Jeffrey Marquez  MRN:  [id number] Subjective:  Patient reports ongoing depression, sadness and some passive thoughts of death, but denies any current suicidal plan or intention and contracts for safety on the unit.  Does not endorse medication side effects.   Objective: I have discussed case with treatment team and have met with patient . Patient is known to our service from prior admission in January 2017. History of Depression and Substance Abuse . States he had plan of overdosing self with cocaine prior to admission . At this time patient remains depressed, sad. As above, denies current plan or intention of hurting self, but states " it's like I'm  tired of living ". Patient has  A history of CAD /Angina, but at this time denies any chest pain or discomfort, and has no SOB at room air . No disruptive or agitated behaviors on unit, has been going to groups.    Principal Problem: Major depressive disorder, recurrent episode, severe ([place]) Diagnosis:   Patient Active Problem List   Diagnosis Date Noted  . Cocaine abuse [F14.10] 04/27/2015  . Major depressive disorder, recurrent episode, severe ([place]) [F33.2] 04/27/2015  . Accelerated hypertension [I10]   . Blurry vision [H53.8]   . Hypertensive urgency [I16.0] 02/14/2015  . MDD (major depressive disorder), recurrent severe, without psychosis ([place]) [F33.2] 02/05/2015  . Unstable angina ([place]) [I20.0] 10/18/2014  . Essential hypertension, malignant [I10] 01/10/2014  . DM II (diabetes mellitus, type II), controlled ([place]) [E11.9] 11/27/2013   Total Time spent with patient: 25 minutes   Past Psychiatric History: See Above  Past Medical History:  Past Medical History  Diagnosis Date  . Diverticulitis   . Hypertension   . Diverticula of colon 2012  . Diabetes mellitus without complication ([place])   . Obesity   . Drug abuse   .  Alcohol abuse     Past Surgical History  Procedure Laterality Date  . Cardiac catheterization N/A 10/18/2014    Procedure: Left Heart Cath and Coronary Angiography;  Surgeon: Sherren Mocha, MD;  Location: [place] CV LAB;  Service: Cardiovascular;  Laterality: N/A;   Family History:  Family History  Problem Relation Age of Onset  . Diabetes Mother   . Diabetes Brother    Family Psychiatric  History: Unknown Social History:  History  Alcohol Use  . Yes     History  Drug Use  . Yes  . Special: Cocaine    Social History   Social History  . Marital Status: Divorced    Spouse Name: N/A  . Number of Children: N/A  . Years of Education: N/A   Social History Main Topics  . Smoking status: Never Smoker   . Smokeless tobacco: Never Used  . Alcohol Use: Yes  . Drug Use: Yes    Special: Cocaine  . Sexual Activity: Not Asked   Other Topics Concern  . None   Social History Narrative   Additional Social History:   Sleep: improving   Appetite:  Improving   Current Medications: Current Facility-Administered Medications  Medication Dose Route Frequency Provider Last Rate Last Dose  . acetaminophen (TYLENOL) tablet 650 mg  650 mg Oral Q6H PRN Patrecia Pour, NP      . alum & mag hydroxide-simeth (MAALOX/MYLANTA) 200-200-20 MG/5ML suspension 30 mL  30 mL Oral Q4H PRN Patrecia Pour, NP      .  atorvastatin (LIPITOR) tablet 10 mg  10 mg Oral Daily Patrecia Pour, NP   10 mg at 04/30/15 0855  . carvedilol (COREG) tablet 25 mg  25 mg Oral BID WC Patrecia Pour, NP   25 mg at 04/30/15 0855  . hydrALAZINE (APRESOLINE) tablet 50 mg  50 mg Oral TID Patrecia Pour, NP   50 mg at 04/30/15 1208  . irbesartan (AVAPRO) tablet 300 mg  300 mg Oral Daily Patrecia Pour, NP   300 mg at 04/30/15 3893   And  . hydrochlorothiazide (HYDRODIURIL) tablet 25 mg  25 mg Oral Daily Patrecia Pour, NP   25 mg at 04/30/15 0854  . isometheptene-acetaminophen-dichloralphenazone (MIDRIN) capsule 1  capsule  1 capsule Oral Q1H PRN Dara Hoyer, PA-C      . LORazepam (ATIVAN) tablet 1 mg  1 mg Oral Q8H PRN Patrecia Pour, NP   1 mg at 04/29/15 2100  . magnesium hydroxide (MILK OF MAGNESIA) suspension 30 mL  30 mL Oral Daily PRN Patrecia Pour, NP      . metFORMIN (GLUCOPHAGE-XR) 24 hr tablet 500 mg  500 mg Oral Q breakfast Patrecia Pour, NP   500 mg at 04/30/15 0854  . nitroGLYCERIN (NITROSTAT) SL tablet 0.4 mg  0.4 mg Sublingual Q5 min PRN Patrecia Pour, NP      . potassium chloride SA (K-DUR,KLOR-CON) CR tablet 20 mEq  20 mEq Oral Daily Patrecia Pour, NP   20 mEq at 04/30/15 0854  . sertraline (ZOLOFT) tablet 100 mg  100 mg Oral Daily Derrill Center, NP   100 mg at 04/30/15 0854  . traZODone (DESYREL) tablet 100 mg  100 mg Oral QHS PRN Derrill Center, NP   100 mg at 04/29/15 2100    Lab Results:  Results for orders placed or performed during the hospital encounter of 04/27/15 (from the past 48 hour(s))  Glucose, capillary     Status: Abnormal   Collection Time: 04/28/15  4:54 PM  Result Value Ref Range   Glucose-Capillary 100 (H) 65 - 99 mg/dL   Comment 1 Notify RN    Comment 2 Document in Chart   Glucose, capillary     Status: Abnormal   Collection Time: 04/29/15  7:30 AM  Result Value Ref Range   Glucose-Capillary 228 (H) 65 - 99 mg/dL   Comment 1 Notify RN   Glucose, capillary     Status: Abnormal   Collection Time: 04/29/15  4:43 PM  Result Value Ref Range   Glucose-Capillary 110 (H) 65 - 99 mg/dL   Comment 1 Notify RN    Comment 2 Document in Chart   Glucose, capillary     Status: Abnormal   Collection Time: 04/30/15  6:20 AM  Result Value Ref Range   Glucose-Capillary 129 (H) 65 - 99 mg/dL   Comment 1 Notify RN    Comment 2 Document in Chart     Blood Alcohol level:  Lab Results  Component Value Date   ETH <5 04/27/2015   ETH <5 02/04/2015    Physical Findings: AIMS: Facial and Oral Movements Muscles of Facial Expression: None, normal Lips and Perioral  Area: None, normal Jaw: None, normal Tongue: None, normal,Extremity Movements Upper (arms, wrists, hands, fingers): None, normal Lower (legs, knees, ankles, toes): None, normal, Trunk Movements Neck, shoulders, hips: None, normal, Overall Severity Severity of abnormal movements (highest score from questions above): None, normal Incapacitation due to abnormal movements:  None, normal Patient's awareness of abnormal movements (rate only patient's report): No Awareness, Dental Status Current problems with teeth and/or dentures?: No Does patient usually wear dentures?: No  CIWA:    COWS:     Musculoskeletal: Strength & Muscle Tone: within normal limits Gait & Station: normal Patient leans: Right  Psychiatric Specialty Exam: Review of Systems  Psychiatric/Behavioral: Positive for depression, suicidal ideas and substance abuse. Negative for hallucinations. The patient is nervous/anxious and has insomnia.   All other systems reviewed and are negative. no chest pain or shortness of breath at room air at this time   Blood pressure 131/93, pulse 71, temperature 98.7 F (37.1 C), temperature source Oral, resp. rate 18, height 5' 9.4" (1.763 m), weight 262 lb (118.842 kg), SpO2 95 %.Body mass index is 38.24 kg/(m^2).  General Appearance:fairly groomed   Eye Contact::  Good   Speech:  Normal   Volume:  Decreased  Mood:  Remains depressed   Affect:  Constricted, does smile briefly at times   Thought Process:  Linear  Orientation:  Full (Time, Place, and Person)  Thought Content:  Tends to ruminate about life stressors, no hallucinations , no delusions expressed   Suicidal Thoughts:  Passive thoughts of death, but denies any plan or intention of suicide or of hurting self on unit and contracts for safety at this time   Homicidal Thoughts:  No  Memory:  Recent and remote grossly intact   Judgement:  Fair  Insight:  Fair  Psychomotor Activity:  Decreased   Concentration:  Good  Recall:  Good   Fund of Knowledge:Good  Language: Good  Akathisia:  No  Handed:  Right  AIMS (if indicated):     Assets:  Housing Social Support  ADL's:  Intact  Cognition: WNL  Sleep:  Number of Hours: 6.75   Assessment - patient has history of chronic depression, and history of substance abuse. Reports suicidal ideation/ attempt by overdosing on cocaine prior to admission. At this time presents depressed, constricted in affect, but denies active SI and is able to contract for safety at this time. Currently on Zoloft trial which he is tolerating well .   Treatment Plan Summary: Daily contact with patient to assess and evaluate symptoms and progress in treatment and Medication management Continue Zoloft 100 mgs  QDAY for  Depression  Continue Trazodone 100 mg  QHS PRN for insomnia as needed  Treatment team working on disposition options . Encourage ongoing milieu , group participation to work on coping skills and symptom reduction .   Neita Garnet, MD 04/30/2015, 3:07 PM

## 2015-04-30 NOTE — Tx Team (Signed)
Interdisciplinary Treatment Plan Update (Adult) Date: 04/30/2015   Time Reviewed: 9:30 AM  Progress in Treatment: Attending groups: Intermittently Participating in groups: Minimally Taking medication as prescribed: Yes Tolerating medication: Yes Family/Significant other contact made: No, CSW assessing for appropriate contacts Patient understands diagnosis: Yes Discussing patient identified problems/goals with staff: Yes Medical problems stabilized or resolved: Yes Denies suicidal/homicidal ideation: Treatment team continuing to assess Issues/concerns per patient self-inventory: Yes Other:  New problem(s) identified: N/A  Discharge Plan or Barriers:  Patient is homeless, unsure of where he will go at d/c.    Reason for Continuation of Hospitalization:  Depression Anxiety Medication Stabilization   Comments: N/A  Estimated length of stay: 2-3 days     Review of initial/current patient goals per problem list:  1. Goal(s): Patient will participate in aftercare plan   Met: No   Target date: 3-5 days post admission date   As evidenced by: Patient will participate within aftercare plan AEB aftercare provider and housing plan at discharge being identified.  3/27: Goal not met: CSW assessing for appropriate referrals for pt and will have follow up secured prior to d/c.     2. Goal (s): Patient will exhibit decreased depressive symptoms and suicidal ideations.   Met: No   Target date: 3-5 days post admission date   As evidenced by: Patient will utilize self rating of depression at 3 or below and demonstrate decreased signs of depression or be deemed stable for discharge by MD.   3/27: Patient rates depression at 9.   3. Goal(s): Patient will demonstrate decreased signs and symptoms of anxiety.   Met:  No   Target date: 3-5 days post admission date   As evidenced by: Patient will utilize self rating of anxiety at 3 or below and demonstrated decreased  signs of anxiety, or be deemed stable for discharge by MD  3/27: Patient rates anxiety at 8.    4. Goal(s): Patient will demonstrate decreased signs of withdrawal due to substance abuse   Met: Yes   Target date: 3-5 days post admission date   As evidenced by: Patient will produce a CIWA/COWS score of 0, have stable vitals signs, and no symptoms of withdrawal  3/27: Goal met. No withdrawal symptoms reported at this time per medical chart.    Attendees: Patient:    Family:    Physician: Dr. Parke Poisson; Dr. Sabra Heck 04/30/2015 9:30 AM  Nursing: Grayland Ormond, Mayra Neer, Christa Kathrin Penner, RN 04/30/2015 9:30 AM  Clinical Social Worker: Erasmo Downer Anslee Micheletti, LCSW 04/30/2015 9:30 AM  Other: Peri Maris, LCSWA; [place], LCSW  04/30/2015 9:30 AM  Other: Norberto Sorenson, P4CC 04/30/2015 9:30 AM  Other: Lars Pinks, Case Manager 04/30/2015 9:30 AM  Other: Agustina Caroli, May Augustin, NP 04/30/2015 9:30 AM  Other:             Scribe for Treatment Team:  Tilden Fossa, [place]

## 2015-04-30 NOTE — Progress Notes (Signed)
Patient ID: Jeffrey CorollaGary Soo, male   DOB: Nov 18, 1960, 55 y.o.   MRN: [id number]  DAR: Pt. Denies SI/HI and A/V Hallucinations during morning assessment but does report he is able to come to writer if he is feeling unsafe. He reports sleep was good, appetite is fair, energy level is low, and concentration is fair. He rates depression and hopelessness 8/10 and anxiety 7/10. Patient does not report any pain or discomfort at this time. Support and encouragement provided to the patient. Scheduled medications administered to patient per physician's orders. Patient is minimal but cooperative. He appears less disheveled and appears in regular clothes. This is improvement from yesterday. Q15 minute checks are maintained for safety.

## 2015-04-30 NOTE — Plan of Care (Signed)
Problem: Ineffective individual coping Goal: STG-Increase in ability to manage activities of daily living Outcome: Progressing Patient out of scrubs and is in regular clothing.

## 2015-04-30 NOTE — [facility] Group Notes (Signed)
[facility] LCSW Group Therapy 04/30/2015  1:15 pm   Type of Therapy: Group Therapy Participation Level: Minimal  Participation Quality: Attentive, Sharing  Affect: Depressed and Flat  Cognitive: Alert and Oriented  Insight: Developing/Improving and Engaged  Engagement in Therapy: Developing/Improving and Engaged  Modes of Intervention: Clarification, Confrontation, Discussion, Education, Exploration, Limit-setting, Orientation, Problem-solving, Rapport Building, Dance movement psychotherapisteality Testing, Socialization and Support  Summary of Progress/Problems: The topic for group was balance in life. Today's group focused on defining balance in one's own words, identifying things that can knock one off balance, and exploring healthy ways to maintain balance in life. Group members were asked to provide an example of a time when they felt off balance, describe how they handled that situation,and process healthier ways to regain balance in the future. Group members were asked to share the most important tool for maintaining [facility] that [facility] and how they plan to apply this method after discharge. Patient identified homelessness and loss of housing, financial stability, well paying job, and a meaningful relationship as throwing his life out of balance. CSW and other group members provided patient with emotional support and encouragement.  Samuella BruinKristin Neeva Trew, MSW, LCSW Clinical Social Worker [facility] [phone]

## 2015-04-30 NOTE — Progress Notes (Signed)
Recreation Therapy Notes  Date: 03.27.2017 Time: 9:30am Location: [address] Group Room   Group Topic: Stress Management  Goal Area(s) Addresses:  Patient will actively participate in stress management techniques presented during session.   Behavioral Response: Did not attend.   Hayli Milligan L Tekeisha Hakim, LRT/CTRS         Marland Reine L 04/30/2015 12:57 PM 

## 2015-04-30 NOTE — Consult Note (Signed)
Medical Consultation   Jeffrey CorollaGary Nicotra  YQM:[id number]  DOB: 04-05-1960  DOA: 04/27/2015  PCP: Jacklynn BarnaclePLACEY,MARY H, NP  Requesting physician: Dr. Nehemiah MassedFernando Cobos, psychiatry   Reason for consultation: Aspirin use in CAD   History of Present Illness: 55 year old male with history of major depressive disorder, diverticulitis, hypertension, coronary disease, diabetes currently admitted to behavioral health for depression. Received phone call regarding use of aspirin in this patient.  Currently no reports of chest pain or shortness of breath.   Allergies:  No Known Allergies    Past Medical History  Diagnosis Date  . Diverticulitis   . Hypertension   . Diverticula of colon 2012  . Diabetes mellitus without complication (HCC)   . Obesity   . Drug abuse   . Alcohol abuse     Past Surgical History  Procedure Laterality Date  . Cardiac catheterization N/A 10/18/2014    Procedure: Left Heart Cath and Coronary Angiography;  Surgeon: Tonny BollmanMichael Cooper, MD;  [facility];  Service: Cardiovascular;  Laterality: N/A;    Social History:  reports that he has never smoked. He has never used smokeless tobacco. He reports that he drinks alcohol. He reports that he uses illicit drugs (Cocaine).  Family History  Problem Relation Age of Onset  . Diabetes Mother   . Diabetes Brother     Review of Systems:  Patient not physically assessed or seen.    Physical Exam: Blood pressure 131/93, pulse 71, temperature 98.7 F (37.1 C), temperature source Oral, resp. rate 18, height 5' 9.4" (1.763 m), weight 118.842 kg (262 lb), SpO2 95 %.  Patient not physically seen.   Labs on Admission:  Basic Metabolic Panel:  Recent Labs Lab 04/27/15 1000  NA 140  K 3.7  CL 101  CO2 27  GLUCOSE 140*  BUN 13  CREATININE 1.20  CALCIUM 9.3   Liver Function Tests:  Recent Labs Lab 04/27/15 1000  AST 17  ALT 17  ALKPHOS 79  BILITOT 1.1  PROT 7.4  ALBUMIN 4.1   No results for  input(s): LIPASE, AMYLASE in the last 168 hours. No results for input(s): AMMONIA in the last 168 hours. CBC:  Recent Labs Lab 04/27/15 1000  WBC 11.5*  HGB 14.5  HCT 42.9  MCV 87.7  PLT 229   Cardiac Enzymes: No results for input(s): CKTOTAL, CKMB, CKMBINDEX, TROPONINI in the last 168 hours. BNP: Invalid input(s): POCBNP CBG:  Recent Labs Lab 04/28/15 1654 04/29/15 0730 04/29/15 1643 04/30/15 0620  GLUCAP 100* 228* 110* 129*    Inpatient Medications:   Scheduled Meds: . aspirin EC  81 mg Oral Daily  . atorvastatin  10 mg Oral Daily  . carvedilol  25 mg Oral BID WC  . hydrALAZINE  50 mg Oral TID  . irbesartan  300 mg Oral Daily   And  . hydrochlorothiazide  25 mg Oral Daily  . metFORMIN  500 mg Oral Q breakfast  . potassium chloride  20 mEq Oral Daily  . sertraline  100 mg Oral Daily   Continuous Infusions:    Radiological Exams on Admission: No results found.  Impression/Recommendations  Patient has history of coronary artery disease, recent cardiac catheterization 10/18/2014 showed mild nonobstructive coronary artery disease, normal left ventricular systolic function. It is recommended that patient continue medical therapy for hypertension and mild nonobstructive coronary artery disease. Patient currently taking Coreg, hydralazine, valsartan/HCTZ, Lipitor. Given history nonobstructive coronary artery disease, aspirin 81 mg recommended. Patient does not have history  of GI bleed or any other contraindications.  Upon reviewing patient's chart, he was noted to be cocaine positive. It is advised that patient does not use beta blocker with cocaine use. He has been taking Coreg which does have alpha/beta properties. Caution advised.   BP has been controlled with current regimen: Coreg, Valsartan/HCTZ.    Diabetes mellitus, type- last hemoglobin A1c was 6.2 on 02/14/2015. Continue current regimen with metformin.   Thank you for the consultation and allowing me to  participate in the care and management of your patient.  If we can be of further assistance, please feel free to contact us.   Time Spent: 10 minutes  Elenore Wanninger D.O. [facility] 04/30/2015, 4:34 PM

## 2015-05-01 DIAGNOSIS — F332 Major depressive disorder, recurrent severe without psychotic features: Secondary | ICD-10-CM | POA: Insufficient documentation

## 2015-05-01 LAB — GLUCOSE, CAPILLARY
GLUCOSE-CAPILLARY: 118 mg/dL — AB (ref 65–99)
Glucose-Capillary: 113 mg/dL — ABNORMAL HIGH (ref 65–99)

## 2015-05-01 MED ORDER — MIRTAZAPINE 7.5 MG PO TABS
7.5000 mg | ORAL_TABLET | Freq: Every day | ORAL | Status: DC
  Administered 2015-05-01 – 2015-05-02 (×2): 7.5 mg via ORAL
  Filled 2015-05-01 (×5): qty 1

## 2015-05-01 MED ORDER — SERTRALINE HCL 100 MG PO TABS
150.0000 mg | ORAL_TABLET | Freq: Every day | ORAL | Status: DC
Start: 2015-05-02 — End: 2015-05-07
  Administered 2015-05-02 – 2015-05-07 (×6): 150 mg via ORAL
  Filled 2015-05-01 (×9): qty 1

## 2015-05-01 NOTE — Progress Notes (Signed)
Pt has isolated to his room most of the evening.  He did not attend evening group.  Pt denies SI/HI/AVH during time of assessment.  He states his day was "ok".  He voiced no needs or concerns.  He is aware that he has no medications scheduled for the night and did not request a sleep aid.  Conversation was minimal.  Pt was encouraged to make his needs known to staff.  Support and encouragement offered.  Discharge plans are in process.  Safety maintained with q15 minute checks.

## 2015-05-01 NOTE — Progress Notes (Signed)
Adult Psychoeducational Group Note  Date:  05/01/2015 Time:  8:25 PM  Group Topic/Focus:  Wrap-Up Group:   The focus of this group is to help patients review their daily goal of treatment and discuss progress on daily workbooks.  Participation Level:  Minimal  Participation Quality:  Appropriate  Affect:  Appropriate  Cognitive:  Appropriate  Insight: Appropriate  Engagement in Group:  Poor  Modes of Intervention:  Discussion  Additional Comments:  Pt was pleasant, however very short with his answers. Pt rated his overall day an 8 out of 10. Pt reported that he did not have a goal for the day.   Cleotilde NeerJasmine S Deontay Ladnier 05/01/2015, 9:05 PM

## 2015-05-01 NOTE — Progress Notes (Signed)
Recreation Therapy Notes  Animal-Assisted Activity (AAA) Program Checklist/Progress Notes Patient Eligibility Criteria Checklist & Daily Group note for Rec Tx Intervention  Date: 03.28.2017 Time: 2:45pm Location: [address]   AAA/T Program Assumption of Risk Form signed by Engineer, productionatient/ or Parent Legal Guardian Yes.    Patient is free of allergies or sever asthma Yes.    Patient reports no fear of animals Yes.    Patient reports no history of cruelty to animals Yes.    Patient understands his/her participation is voluntary Yes.    Patient washes hands before animal contact Yes.    Patient washes hands after animal contact Yes.    Behavioral Response: Appropriate   Education: Charity fundraiserHand Washing, Appropriate Animal Interaction   Education Outcome: Acknowledges education.   Clinical Observations/Feedback: Patient interacted appropriately with therapy dog and peers during session.    Marykay Lexenise L Ronnika Collett, LRT/CTRS        Joan Avetisyan L 05/01/2015 3:22 PM

## 2015-05-01 NOTE — Progress Notes (Signed)
Pt attended spiritual care group on grief and loss facilitated by chaplain Delesa Kawa   Group opened with brief discussion and psycho-social ed around grief and loss in relationships and in relation to self - identifying life patterns, circumstances, changes that cause losses. Established group norm of speaking from own life experience. Group goal of establishing open and affirming space for members to share loss and experience with grief, normalize grief experience and provide psycho social education and grief support.     

## 2015-05-01 NOTE — [facility] Group Notes (Signed)
[facility] LCSW Group Therapy  05/01/2015   1:15 PM   Type of Therapy:  Group Therapy  Participation Level:  Minimal  Participation Quality:  Attentive, Sharing and Supportive  Affect:  Depressed and Flat  Cognitive:  Alert and Oriented  Insight:  Developing/Improving and Engaged  Engagement in Therapy:  Developing/Improving and Engaged  Modes of Intervention:  Clarification, Confrontation, Discussion, Education, Exploration, Limit-setting, Orientation, Problem-solving, Rapport Building, Dance movement psychotherapisteality Testing, Socialization and Support  Summary of Progress/Problems: The topic for group therapy was feelings about diagnosis.  Pt actively participated in group discussion on their past and current diagnosis and how they feel towards this.  Pt also identified how society and family members judge them, based on their diagnosis as well as stereotypes and stigmas.  Patient discussed ways in which his depression has negatively impacted his life. CSW and other group members provided patient with emotional support and encouragement.   Samuella BruinKristin Ocie Tino, MSW, LCSW Clinical Social Worker [facility] [phone]

## 2015-05-01 NOTE — Progress Notes (Signed)
Patient ID: Nicholaus CorollaGary Mynhier, male   DOB: 1960/03/20, 55 y.o.   MRN: [id number]  Adult Psychoeducational Group Note  Date:  05/01/2015 Time: 09:00am  Group Topic/Focus:  Recovery Goals:   The focus of this group is to identify appropriate goals for recovery and establish a plan to achieve them.  Participation Level:  Did Not Attend  Participation Quality: n/a  Affect:  n/a  Cognitive: n/a  Insight: n/a  Engagement in Group: n/a  Modes of Intervention:  Activity, Discussion, Education and Support  Additional Comments:  Pt chose not to attend group, pt in bed asleep.  Aurora Maskwyman, Lesleyanne Politte E 05/01/2015, 11:04 AM

## 2015-05-01 NOTE — Progress Notes (Signed)
Patient ID: Jeffrey Marquez, male   DOB: 11-08-1960, 55 y.o.   MRN: [id number]  Pt currently presents with a blunted affect and depressed, isolative behavior. Per self inventory, pt rates depression at a 9, hopelessness 9 and anxiety 8. Pt's daily goal is to "not at this time." Pt reports fair sleep, a fair appetite, low energy and poor concentration. Pt attends groups but remains in bed during downtime, interacts very little with other staff members.    Pt provided with medications per providers orders. Pt's labs and vitals were monitored throughout the day. Pt supported emotionally and encouraged to express concerns and questions. Pt educated on medications.  Pt's safety ensured with 15 minute and environmental checks. Pt currently denies SI/HI and A/V hallucinations. Pt verbally agrees to seek staff if SI/HI or A/VH occurs and to consult with staff before acting on these thoughts. Will continue POC.

## 2015-05-01 NOTE — [facility] Suicide Risk Assessment (Signed)
[facility] INPATIENT:  Family/Significant Other Suicide Prevention Education  Suicide Prevention Education:  Contact Attempts: daughter Jeffrey Marquez [phone], (name of family member/significant other) has been identified by the patient as the family member/significant other with whom the patient will be residing, and identified as the person(s) who will aid the patient in the event of a mental health crisis.  With written consent from the patient, two attempts were made to provide suicide prevention education, prior to and/or following the patient's discharge.  We were unsuccessful in providing suicide prevention education.  A suicide education pamphlet was given to the patient to share with family/significant other.  Date and time of first attempt: 05/01/15 at 12:40pm Date and time of second attempt:v3/29/17 at 11:35pm  [name], [place] CarboKristin L 05/01/2015, 12:42 PM

## 2015-05-01 NOTE — Progress Notes (Signed)
Patient ID: Jeffrey Marquez, male   DOB: [birth year], 55 y.o.   MRN: [id number] [facility] MD Progress Note  05/01/2015 4:21 PM Jeffrey Marquez  MRN:  [id number] Subjective:  Patient states he remains depressed, sad, and reports passive thoughts of preferring to be dead. He denies any current suicidal plan or intention, and contracts for safety. He also was better able to speak about issues not related to grief and loss. He had been focusing on deceased loved ones and limited social support system, but today also  spoke about his daughter, grandchild. Does not endorse medication side effects.    Objective: I have discussed case with treatment team and have met with patient . Remains depressed, constricted in affect , and describing ongoing passive thoughts of death, dying, but denying any plan or intention of hurting self or of SI at this time . Currently on Zoloft trial, which he is tolerating well . Patient has history of HTN, DM, Angina- at this time no shortness of breath, no chest pain , describes occasional  Headaches. At this time not in any acute distress or discomfort, and most recent BP 143/85.  I have discussed case with Hospitalist who has reviewed current management - has been started on ASA prophylaxis . Continue current antihypertensive regimen at this time . No disruptive or agitated behaviors on unit, tends to be isolative , but has been going to some groups. Staff reports he presents with sad, constricted affect .  Principal Problem: Major depressive disorder, recurrent episode, severe ([place]) Diagnosis:   Patient Active Problem List   Diagnosis Date Noted  . Cocaine abuse [F14.10] 04/27/2015  . Major depressive disorder, recurrent episode, severe ([place]) [F33.2] 04/27/2015  . Accelerated hypertension [I10]   . Blurry vision [H53.8]   . Hypertensive urgency [I16.0] 02/14/2015  . MDD (major depressive disorder), recurrent severe, without psychosis ([place]) [F33.2] 02/05/2015  . Unstable angina  ([place]) [I20.0] 10/18/2014  . Essential hypertension, malignant [I10] 01/10/2014  . DM II (diabetes mellitus, type II), controlled ([place]) [E11.9] 11/27/2013   Total Time spent with patient: 25 minutes   Past Psychiatric History: See Above  Past Medical History:  Past Medical History  Diagnosis Date  . Diverticulitis   . Hypertension   . Diverticula of colon 2012  . Diabetes mellitus without complication ([place])   . Obesity   . Drug abuse   . Alcohol abuse     Past Surgical History  Procedure Laterality Date  . Cardiac catheterization N/A 10/18/2014    Procedure: Left Heart Cath and Coronary Angiography;  Surgeon: Sherren Mocha, MD;  Location: [place] [facility];  Service: Cardiovascular;  Laterality: N/A;   Family History:  Family History  Problem Relation Age of Onset  . Diabetes Mother   . Diabetes Brother    Family Psychiatric  History: Unknown Social History:  History  Alcohol Use  . Yes     History  Drug Use  . Yes  . Special: Cocaine    Social History   Social History  . Marital Status: Divorced    Spouse Name: N/A  . Number of Children: N/A  . Years of Education: N/A   Social History Main Topics  . Smoking status: Never Smoker   . Smokeless tobacco: Never Used  . Alcohol Use: Yes  . Drug Use: Yes    Special: Cocaine  . Sexual Activity: Not Asked   Other Topics Concern  . None   Social History Narrative   Additional Social History:  Sleep: improving   Appetite:  Improving   Current Medications: Current Facility-Administered Medications  Medication Dose Route Frequency Provider Last Rate Last Dose  . acetaminophen (TYLENOL) tablet 650 mg  650 mg Oral Q6H PRN Patrecia Pour, NP      . alum & mag hydroxide-simeth (MAALOX/MYLANTA) 200-200-20 MG/5ML suspension 30 mL  30 mL Oral Q4H PRN Patrecia Pour, NP      . aspirin EC tablet 81 mg  81 mg Oral Daily Maryann Mikhail, DO   81 mg at 05/01/15 0818  . atorvastatin (LIPITOR) tablet 10 mg  10 mg Oral  Daily Patrecia Pour, NP   10 mg at 05/01/15 0819  . carvedilol (COREG) tablet 25 mg  25 mg Oral BID WC Patrecia Pour, NP   25 mg at 05/01/15 7062  . hydrALAZINE (APRESOLINE) tablet 50 mg  50 mg Oral TID Patrecia Pour, NP   50 mg at 05/01/15 1153  . irbesartan (AVAPRO) tablet 300 mg  300 mg Oral Daily Patrecia Pour, NP   300 mg at 05/01/15 0818   And  . hydrochlorothiazide (HYDRODIURIL) tablet 25 mg  25 mg Oral Daily Patrecia Pour, NP   25 mg at 05/01/15 0819  . isometheptene-acetaminophen-dichloralphenazone (MIDRIN) capsule 1 capsule  1 capsule Oral Q1H PRN Dara Hoyer, PA-C      . LORazepam (ATIVAN) tablet 1 mg  1 mg Oral Q8H PRN Patrecia Pour, NP   1 mg at 04/29/15 2100  . magnesium hydroxide (MILK OF MAGNESIA) suspension 30 mL  30 mL Oral Daily PRN Patrecia Pour, NP      . metFORMIN (GLUCOPHAGE-XR) 24 hr tablet 500 mg  500 mg Oral Q breakfast Patrecia Pour, NP   500 mg at 05/01/15 3762  . nitroGLYCERIN (NITROSTAT) SL tablet 0.4 mg  0.4 mg Sublingual Q5 min PRN Patrecia Pour, NP      . potassium chloride SA (K-DUR,KLOR-CON) CR tablet 20 mEq  20 mEq Oral Daily Patrecia Pour, NP   20 mEq at 05/01/15 8315  . sertraline (ZOLOFT) tablet 100 mg  100 mg Oral Daily Derrill Center, NP   100 mg at 05/01/15 0819  . traZODone (DESYREL) tablet 100 mg  100 mg Oral QHS PRN Derrill Center, NP   100 mg at 04/29/15 2100    Lab Results:  Results for orders placed or performed during the hospital encounter of 04/27/15 (from the past 48 hour(s))  Glucose, capillary     Status: Abnormal   Collection Time: 04/29/15  4:43 PM  Result Value Ref Range   Glucose-Capillary 110 (H) 65 - 99 mg/dL   Comment 1 Notify RN    Comment 2 Document in Chart   Glucose, capillary     Status: Abnormal   Collection Time: 04/30/15  6:20 AM  Result Value Ref Range   Glucose-Capillary 129 (H) 65 - 99 mg/dL   Comment 1 Notify RN    Comment 2 Document in Chart   Glucose, capillary     Status: Abnormal   Collection  Time: 04/30/15  4:54 PM  Result Value Ref Range   Glucose-Capillary 105 (H) 65 - 99 mg/dL   Comment 1 Notify RN    Comment 2 Document in Chart   Glucose, capillary     Status: Abnormal   Collection Time: 05/01/15  6:44 AM  Result Value Ref Range   Glucose-Capillary 118 (H) 65 - 99 mg/dL  Blood Alcohol level:  Lab Results  Component Value Date   ETH <5 04/27/2015   ETH <5 02/04/2015    Physical Findings: AIMS: Facial and Oral Movements Muscles of Facial Expression: None, normal Lips and Perioral Area: None, normal Jaw: None, normal Tongue: None, normal,Extremity Movements Upper (arms, wrists, hands, fingers): None, normal Lower (legs, knees, ankles, toes): None, normal, Trunk Movements Neck, shoulders, hips: None, normal, Overall Severity Severity of abnormal movements (highest score from questions above): None, normal Incapacitation due to abnormal movements: None, normal Patient's awareness of abnormal movements (rate only patient's report): No Awareness, Dental Status Current problems with teeth and/or dentures?: No Does patient usually wear dentures?: No  CIWA:    COWS:     Musculoskeletal: Strength & Muscle Tone: within normal limits Gait & Station: normal Patient leans: Right  Psychiatric Specialty Exam: Review of Systems  Psychiatric/Behavioral: Positive for depression, suicidal ideas and substance abuse. Negative for hallucinations. The patient is nervous/anxious and has insomnia.   All other systems reviewed and are negative. no chest pain or shortness of breath at room air at this time   Blood pressure 143/85, pulse 63, temperature 97.5 F (36.4 C), temperature source Oral, resp. rate 20, height 5' 9.4" (1.763 m), weight 262 lb (118.842 kg), SpO2 95 %.Body mass index is 38.24 kg/(m^2).  General Appearance:fairly groomed   Eye Contact::  Good   Speech:  Normal   Volume:  Normal  Mood:  Depressed   Affect:  Constricted,  Thought Process:  Linear   Orientation:  Full (Time, Place, and Person)  Thought Content:  Still ruminative, no hallucinations, no delusions , not internally preoccupied   Suicidal Thoughts:  Passive thoughts of death, but denies any plan or intention of suicide or of hurting self on unit and contracts for safety at this time   Homicidal Thoughts:  No  Memory:  Recent and remote grossly intact   Judgement:  Fair  Insight:  Fair  Psychomotor Activity: still decreased but more visible on unit than on admission  Concentration:  Good  Recall:  Good  Fund of Knowledge:Good  Language: Good  Akathisia:  No  Handed:  Right  AIMS (if indicated):     Assets:  Housing Social Support  ADL's:  Intact  Cognition: WNL  Sleep:  Number of Hours: 6.5   Assessment - patient remains sad, depressed, constricted in affect. Passive thoughts of death, but no suicidal plan or intention and able to contract for safety on unit. No psychotic symptoms. At this time tolerating Zoloft trial well . We discussed option of adding Remeron to regimen to augment antidepressant treatment- side effects discussed. Patient agrees .   Treatment Plan Summary: Daily contact with patient to assess and evaluate symptoms and progress in treatment and Medication management Increase  Zoloft to 150  mgs  QDAY for  Depression  D/C Trazodone Start Remeron 7.5 mgrs QHS for depression and insomnia  Treatment team working on disposition options . Encourage ongoing milieu , group participation to work on coping skills and symptom reduction .   Neita Garnet, MD 05/01/2015, 4:21 PM

## 2015-05-02 LAB — GLUCOSE, CAPILLARY
GLUCOSE-CAPILLARY: 111 mg/dL — AB (ref 65–99)
Glucose-Capillary: 113 mg/dL — ABNORMAL HIGH (ref 65–99)

## 2015-05-02 MED ORDER — HYDRALAZINE HCL 50 MG PO TABS
100.0000 mg | ORAL_TABLET | Freq: Three times a day (TID) | ORAL | Status: DC
  Administered 2015-05-03 – 2015-05-07 (×14): 100 mg via ORAL
  Filled 2015-05-02 (×2): qty 2
  Filled 2015-05-02: qty 18
  Filled 2015-05-02 (×6): qty 2
  Filled 2015-05-02: qty 18
  Filled 2015-05-02: qty 2
  Filled 2015-05-02: qty 18
  Filled 2015-05-02: qty 2
  Filled 2015-05-02 (×3): qty 18

## 2015-05-02 MED ORDER — HYDRALAZINE HCL 50 MG PO TABS
50.0000 mg | ORAL_TABLET | Freq: Once | ORAL | Status: AC
  Administered 2015-05-02: 50 mg via ORAL
  Filled 2015-05-02: qty 1

## 2015-05-02 NOTE — [facility] Group Notes (Signed)
[facility]  05/02/2015  8:45 AM  Participation Quality: Did Not Attend. Patient invited to participate but declined.  Samuella BruinKristin Christo Hain, MSW, LCSW Clinical Social Worker [facility] [phone]

## 2015-05-02 NOTE — Progress Notes (Signed)
Patient ID: Jeffrey CorollaGary Marquez, male   DOB: Feb 19, 1960, 55 y.o.   MRN: [id number]  DAR: Pt. Denies HI and A/V Hallucinations. He reports passive SI he is able to contract for safety. He rates depression and hopelessness 9/10. He reports sleep is good, appetite is fair, energy level is low, and concentration is poor. Patient does report a headache and received PRN Tylenol. Support and encouragement provided to the patient. Scheduled medications administered to patient per physician's orders. Patient has been staying in his room more today mainly sleeping although he's been encouraged to come into the milieu. Q15 minute checks are maintained for safety.

## 2015-05-02 NOTE — [facility] Group Notes (Signed)
[facility] LCSW Group Therapy 05/02/2015  1:15 PM   Type of Therapy: Group Therapy  Participation Level: Did Not Attend. Patient invited to participate but declined.   Jeffrey Marquez, MSW, LCSW Clinical Social Worker [facility] [phone]    

## 2015-05-02 NOTE — Progress Notes (Signed)
Pt was in the dayroom watching TV at the beginning of the shift.  He presents with flat/sad affect.  He reports he is feeling a little better today, except that he has a headache.  He did not want anything for the headache until bedtime.  He is still having passive suicidal thoughts that come and go.  He denies HI/AVH.  He is unsure what his discharge plans are at this point, saying that he has not discussed his discharge with the CSW yet.  He voices no other needs or concerns at this time.  Support and encouragement offered.  Safety maintained with q15 minute checks.

## 2015-05-02 NOTE — Progress Notes (Signed)
Recreation Therapy Notes  Date: 03.29.2017 Time: 9:30am Location: [address] Group Room   Group Topic: Stress Management  Goal Area(s) Addresses:  Patient will actively participate in stress management techniques presented during session.   Behavioral Response: Did not attend.  [name] [name], LRT/CTRS        [name] L 05/02/2015 9:55 AM 

## 2015-05-02 NOTE — Consult Note (Signed)
Medical Consultation  Jeffrey Marquez YNW:[id number] DOB: 10/22/1960 DOA: 04/27/2015 PCP: Jacklynn Barnacle, NP   Requesting physician: Dr. Jama Flavors Date of consultation: 05/02/15 Reason for consultation: HTN  Impression/Recommendations Hypertension -Poorly controlled despite carvedilol, hydralazine, ARB/HCTZ -Increase hydralazine to 100 mg 3 times a day -Check BMP -We will consider adding amlodipine and possibly Aldactone if blood pressure is still poorly controlled. Diabetes mellitus type 2 -02/14/2015 hemoglobin A1c--6.2 -Continue metformin -Check BMP -discussed lifestyle modification and weight loss Polysubstance abuse -Including tobacco and cocaine MDD -per psychiatry    I will followup again tomorrow. Please contact me if I can be of assistance in the meanwhile. Thank you for this consultation.  Chief Complaint: HTN  HPI:  55 year old male with a history of diabetes mellitus, hypertension, depression, cocaine abuse,presented via EMS on 04/27/2015 with suicidal ideation. The patient tried to overdose on cocaine due to his ongoing depression. The patient was ultimately transferred to the behavioral health Hospital where he was started on multiple disciplinary therapy.  Notably, the patient recently had admission from 02/14/2015 through 02/16/2015. At that time, the patient was transferred to the acute hospital due tohypertensive urgency. He was started on nitroglycerin infusion at that time with improvement.he was discharged with carvedilol, hydralazine, and valsartan/HCTZ. He states that prior to this admission he had been compliant with his medications. He stated that he received his medications from the [facility].  Since admission to behavioral health on 04/27/2015, the patient has had persistently elevated blood pressures with systolic BP ranging 150-170 and diastolic ranging 65-784 Despite being on his home regimen.  As result, medical consultation was obtained to optimize his blood  pressure.in addition, the patient had heart catheterizationon 10/18/2014 which revealed nonobstructive CAD.  Echocardiogram on 01/12/2014 showed EF 50-55 percent.  Patient denies fevers, chills, headache, chest pain, dyspnea, nausea, vomiting, diarrhea, abdominal pain, dysuria, hematuria.He complains of bifrontal headache without any focal neurologic deficits or visual disturbance.   Review of Systems:  Constitutional:  No weight loss, night sweats, Fevers, chills, fatigue.  Head&Eyes: No headache.  No vision loss.  No eye pain or scotoma ENT:  No Difficulty swallowing,Tooth/dental problems,Sore throat,  No ear ache, post nasal drip,  Cardio-vascular:  No chest pain, Orthopnea, PND, swelling in lower extremities,  dizziness, palpitations  GI:  No heartburn, indigestion, abdominal pain, nausea, vomiting, diarrhea, loss of appetite, hematochezia, melena Resp:  No shortness of breath with exertion or at rest. No excess mucus, no productive cough, No non-productive cough, No coughing up of blood.No change in color of mucus.No wheezing.No chest wall deformity  Skin:  no rash or lesions.  GU:  no dysuria, change in color of urine, no urgency or frequency. No flank pain.  Musculoskeletal:  No joint pain or swelling. No decreased range of motion. No back pain.  Psych:  No change in mood or affect. No depression or anxiety. Neurologic:  no dysesthesia, no focal weakness, no vision loss. No syncope   Past Medical History  Diagnosis Date  . Diverticulitis   . Hypertension   . Diverticula of colon 2012  . Diabetes mellitus without complication (HCC)   . Obesity   . Drug abuse   . Alcohol abuse    Past Surgical History  Procedure Laterality Date  . Cardiac catheterization N/A 10/18/2014    Procedure: Left Heart Cath and Coronary Angiography;  Surgeon: Tonny Bollman, MD;  Location: [facility] INVASIVE CV LAB;  Service: Cardiovascular;  Laterality: N/A;   Social History:  reports that he has  never  smoked. He has never used smokeless tobacco. He reports that he drinks alcohol. He reports that he uses illicit drugs (Cocaine).  Family History  Problem Relation Age of Onset  . Diabetes Mother   . Diabetes Brother     No Known Allergies   Prior to Admission medications   Medication Sig Start Date End Date Taking? Authorizing Provider  atorvastatin (LIPITOR) 10 MG tablet Take 1 tablet (10 mg total) by mouth daily. 02/16/15   Osvaldo ShipperGokul Krishnan, MD  butalbital-acetaminophen-caffeine (FIORICET, ESGIC) [phone] MG tablet Take 1-2 tablets by mouth every 4 (four) hours as needed for headache. 02/16/15   Osvaldo ShipperGokul Krishnan, MD  carvedilol (COREG) 25 MG tablet Take 1 tablet (25 mg total) by mouth 2 (two) times daily with a meal. 02/16/15   Osvaldo ShipperGokul Krishnan, MD  guaiFENesin (MUCINEX) 600 MG 12 hr tablet Take 2 tablets (1,200 mg total) by mouth 2 (two) times daily. May purchase over the counter as desired. Patient not taking: Reported on 04/27/2015 02/12/15   Thermon LeylandLaura A Davis, NP  hydrALAZINE (APRESOLINE) 50 MG tablet Take 1 tablet (50 mg total) by mouth 3 (three) times daily. 02/16/15   Osvaldo ShipperGokul Krishnan, MD  metFORMIN (GLUCOPHAGE XR) 500 MG 24 hr tablet Take 1 tablet (500 mg total) by mouth daily with breakfast. 02/16/15   Osvaldo ShipperGokul Krishnan, MD  nitroGLYCERIN (NITROSTAT) 0.4 MG SL tablet Place 1 tablet (0.4 mg total) under the tongue every 5 (five) minutes as needed for chest pain. 10/18/14   Vesta MixerPhilip J Nahser, MD  Potassium Chloride ER 20 MEQ TBCR Take 20 mEq by mouth daily. 02/16/15   Osvaldo ShipperGokul Krishnan, MD  sertraline (ZOLOFT) 50 MG tablet Take 1 tablet (50 mg total) by mouth daily. 02/16/15   Osvaldo ShipperGokul Krishnan, MD  traZODone (DESYREL) 50 MG tablet Take 1 tablet (50 mg total) by mouth at bedtime as needed for sleep. 02/16/15   Osvaldo ShipperGokul Krishnan, MD  valsartan-hydrochlorothiazide (DIOVAN-HCT) 320-25 MG tablet Take 1 tablet by mouth daily. 02/16/15   Osvaldo ShipperGokul Krishnan, MD    Physical Exam: Filed Vitals:   05/02/15 [id number] 05/02/15  0614 05/02/15 1200 05/02/15 1700  BP: 169/101 162/99 151/88 159/95  Pulse: 61 72 63 79  Temp: 98.6 F (37 C)     TempSrc: Oral     Resp: 16     Height:      Weight:      SpO2:       General:  A&O x 3, NAD, nontoxic, pleasant/cooperative Head/Eye: No conjunctival hemorrhage, no icterus, [place]/AT, No nystagmus ENT:  No icterus,  No thrush, good dentition, no pharyngeal exudate Neck:  No masses, no lymphadenpathy, no bruits CV:  RRR, no rub, no gallop, no S3 Lung:  CTAB, good air movement, no wheeze, no rhonchi Abdomen: soft/NT, +BS, nondistended, no peritoneal signs Ext: No cyanosis, No rashes, No petechiae, No lymphangitis, trace LE edema Neuro: CNII-XII intact, strength 4/5 in bilateral upper and lower extremities, no dysmetria  Labs on Admission:  Basic Metabolic Panel:  Recent Labs Lab 04/27/15 1000  NA 140  K 3.7  CL 101  CO2 27  GLUCOSE 140*  BUN 13  CREATININE 1.20  CALCIUM 9.3   Liver Function Tests:  Recent Labs Lab 04/27/15 1000  AST 17  ALT 17  ALKPHOS 79  BILITOT 1.1  PROT 7.4  ALBUMIN 4.1   No results for input(s): LIPASE, AMYLASE in the last 168 hours. No results for input(s): AMMONIA in the last 168 hours. CBC:  Recent Labs Lab 04/27/15 1000  WBC 11.5*  HGB 14.5  HCT 42.9  MCV 87.7  PLT 229   Cardiac Enzymes: No results for input(s): CKTOTAL, CKMB, CKMBINDEX, TROPONINI in the last 168 hours. BNP: Invalid input(s): POCBNP CBG:  Recent Labs Lab 04/30/15 1654 05/01/15 0644 05/01/15 1641 05/02/15 0613 05/02/15 1651  GLUCAP 105* 118* 113* 111* 113*    Radiological Exams on Admission: No results found.    Time spent: 50 min  Jarel Cuadra [facility] Pager [phone]  If 7PM-7AM, please contact night-coverage [email or web address] Password [facility] 05/02/2015, 5:56 PM

## 2015-05-02 NOTE — Progress Notes (Addendum)
Patient ID: Jeffrey Marquez, male   DOB: 15-Jan-1961, 55 y.o.   MRN: [id number] [facility] MD Progress Note  05/02/2015 1:48 PM Jeffrey Marquez  MRN:  [id number] Subjective:  Patient states he is feeling " just a little bit better today". Still depressed, ruminative, but at this time denies any ongoing suicidal ideations. He is also starting to focus a little more on disposition plans , and worries about " where I am going to go when I leave here" ( one of his concerns is that the shelter he was staying in is no longer available ) . States he is not interested in going outside of [place], because has some employment opportunities locally.     Objective: I have discussed case with treatment team and have met with patient . Patient continues to present depressed, sad , but does acknowledge some improvement compared to admission, and today not suicidal. As noted, more focused on discharge plans, but reports sense of pessimism . " I don't think anything will get better". Denies chest pain or shortness of breath. Has had some headaches recently, but not at this time. Was started on Midrin PRNs, but will D/C due to concern it could adversely affect his Hypertension. Denies medication side effects. Some group participation, no disruptive behaviors on unit .  Principal Problem: Major depressive disorder, recurrent episode, severe ([place]) Diagnosis:   Patient Active Problem List   Diagnosis Date Noted  . Severe episode of recurrent major depressive disorder, without psychotic features ([place]) [F33.2]   . Cocaine abuse [F14.10] 04/27/2015  . Major depressive disorder, recurrent episode, severe ([place]) [F33.2] 04/27/2015  . Accelerated hypertension [I10]   . Blurry vision [H53.8]   . Hypertensive urgency [I16.0] 02/14/2015  . MDD (major depressive disorder), recurrent severe, without psychosis ([place]) [F33.2] 02/05/2015  . Unstable angina ([place]) [I20.0] 10/18/2014  . Essential hypertension, malignant [I10] 01/10/2014  .  DM II (diabetes mellitus, type II), controlled ([place]) [E11.9] 11/27/2013   Total Time spent with patient: 20 minutes   Past Psychiatric History: See Above  Past Medical History:  Past Medical History  Diagnosis Date  . Diverticulitis   . Hypertension   . Diverticula of colon 2012  . Diabetes mellitus without complication ([place])   . Obesity   . Drug abuse   . Alcohol abuse     Past Surgical History  Procedure Laterality Date  . Cardiac catheterization N/A 10/18/2014    Procedure: Left Heart Cath and Coronary Angiography;  Surgeon: Sherren Mocha, MD;  Location: [place] CV LAB;  Service: Cardiovascular;  Laterality: N/A;   Family History:  Family History  Problem Relation Age of Onset  . Diabetes Mother   . Diabetes Brother    Family Psychiatric  History: Unknown Social History:  History  Alcohol Use  . Yes     History  Drug Use  . Yes  . Special: Cocaine    Social History   Social History  . Marital Status: Divorced    Spouse Name: N/A  . Number of Children: N/A  . Years of Education: N/A   Social History Main Topics  . Smoking status: Never Smoker   . Smokeless tobacco: Never Used  . Alcohol Use: Yes  . Drug Use: Yes    Special: Cocaine  . Sexual Activity: Not Asked   Other Topics Concern  . None   Social History Narrative   Additional Social History:   Sleep: improving   Appetite:  Improving   Current Medications: Current Facility-Administered  Medications  Medication Dose Route Frequency Provider Last Rate Last Dose  . acetaminophen (TYLENOL) tablet 650 mg  650 mg Oral Q6H PRN Patrecia Pour, NP   650 mg at 05/02/15 1216  . alum & mag hydroxide-simeth (MAALOX/MYLANTA) 200-200-20 MG/5ML suspension 30 mL  30 mL Oral Q4H PRN Patrecia Pour, NP      . aspirin EC tablet 81 mg  81 mg Oral Daily Maryann Mikhail, DO   81 mg at 05/02/15 0815  . atorvastatin (LIPITOR) tablet 10 mg  10 mg Oral Daily Patrecia Pour, NP   10 mg at 05/02/15 0815  .  carvedilol (COREG) tablet 25 mg  25 mg Oral BID WC Patrecia Pour, NP   25 mg at 05/02/15 0815  . hydrALAZINE (APRESOLINE) tablet 50 mg  50 mg Oral TID Patrecia Pour, NP   50 mg at 05/02/15 1215  . irbesartan (AVAPRO) tablet 300 mg  300 mg Oral Daily Patrecia Pour, NP   300 mg at 05/02/15 0815   And  . hydrochlorothiazide (HYDRODIURIL) tablet 25 mg  25 mg Oral Daily Patrecia Pour, NP   25 mg at 05/02/15 0816  . LORazepam (ATIVAN) tablet 1 mg  1 mg Oral Q8H PRN Patrecia Pour, NP   1 mg at 04/29/15 2100  . magnesium hydroxide (MILK OF MAGNESIA) suspension 30 mL  30 mL Oral Daily PRN Patrecia Pour, NP      . metFORMIN (GLUCOPHAGE-XR) 24 hr tablet 500 mg  500 mg Oral Q breakfast Patrecia Pour, NP   500 mg at 05/02/15 0816  . mirtazapine (REMERON) tablet 7.5 mg  7.5 mg Oral QHS Myer Peer Eliyanah Elgersma, MD   7.5 mg at 05/01/15 2114  . nitroGLYCERIN (NITROSTAT) SL tablet 0.4 mg  0.4 mg Sublingual Q5 min PRN Patrecia Pour, NP      . potassium chloride SA (K-DUR,KLOR-CON) CR tablet 20 mEq  20 mEq Oral Daily Patrecia Pour, NP   20 mEq at 05/02/15 0816  . sertraline (ZOLOFT) tablet 150 mg  150 mg Oral Daily Jenne Campus, MD   150 mg at 05/02/15 5465    Lab Results:  Results for orders placed or performed during the hospital encounter of 04/27/15 (from the past 48 hour(s))  Glucose, capillary     Status: Abnormal   Collection Time: 04/30/15  4:54 PM  Result Value Ref Range   Glucose-Capillary 105 (H) 65 - 99 mg/dL   Comment 1 Notify RN    Comment 2 Document in Chart   Glucose, capillary     Status: Abnormal   Collection Time: 05/01/15  6:44 AM  Result Value Ref Range   Glucose-Capillary 118 (H) 65 - 99 mg/dL  Glucose, capillary     Status: Abnormal   Collection Time: 05/01/15  4:41 PM  Result Value Ref Range   Glucose-Capillary 113 (H) 65 - 99 mg/dL  Glucose, capillary     Status: Abnormal   Collection Time: 05/02/15  6:13 AM  Result Value Ref Range   Glucose-Capillary 111 (H) 65 - 99 mg/dL     Blood Alcohol level:  Lab Results  Component Value Date   ETH <5 04/27/2015   ETH <5 02/04/2015    Physical Findings: AIMS: Facial and Oral Movements Muscles of Facial Expression: None, normal Lips and Perioral Area: None, normal Jaw: None, normal Tongue: None, normal,Extremity Movements Upper (arms, wrists, hands, fingers): None, normal Lower (legs, knees, ankles, toes): None,  normal, Trunk Movements Neck, shoulders, hips: None, normal, Overall Severity Severity of abnormal movements (highest score from questions above): None, normal Incapacitation due to abnormal movements: None, normal Patient's awareness of abnormal movements (rate only patient's report): No Awareness, Dental Status Current problems with teeth and/or dentures?: No Does patient usually wear dentures?: No  CIWA:    COWS:     Musculoskeletal: Strength & Muscle Tone: within normal limits Gait & Station: normal Patient leans: Right  Psychiatric Specialty Exam: Review of Systems  Psychiatric/Behavioral: Positive for depression, suicidal ideas and substance abuse. Negative for hallucinations. The patient is nervous/anxious and has insomnia.   All other systems reviewed and are negative. no chest pain or shortness of breath at room air at this time , denies epigastric discomfort or GI bleed   Blood pressure 162/99, pulse 72, temperature 98.6 F (37 C), temperature source Oral, resp. rate 16, height 5' 9.4" (1.763 m), weight 262 lb (118.842 kg), SpO2 95 %.Body mass index is 38.24 kg/(m^2).  General Appearance:fairly groomed   Eye Contact::  Good   Speech:  Normal   Volume:  Normal  Mood:  Remains depressed, but acknowledges some improvement compared to admission   Affect:  Constricted,  Thought Process:  Linear  Orientation:  Full (Time, Place, and Person)  Thought Content:  Still ruminative, no hallucinations, no delusions , not internally preoccupied   Suicidal Thoughts: at this time denies suicidal  ideations , denies self injurious ideations   Homicidal Thoughts:  No  Memory:  Recent and remote grossly intact   Judgement:   Improving   Insight:   Improving   Psychomotor Activity: still decreased but more visible on unit than on admission  Concentration:  Good  Recall:  Good  Fund of Knowledge:Good  Language: Good  Akathisia:  No  Handed:  Right  AIMS (if indicated):     Assets:  Housing Social Support  ADL's:  Intact  Cognition: WNL  Sleep:  Number of Hours: 6.75   Assessment - patient remains depressed, expressing subjective sense of pessimism , hopelessness, but acknowledging some improvement of mood , and no longer feeling suicidal . Able to focus more on disposition plans - homelessness is a major stressor. Thus far tolerating Zoloft and more recent addition of Remeron well . Denies side effects. Treatment Plan Summary: Daily contact with patient to assess and evaluate symptoms and progress in treatment and Medication management Continue   Zoloft  150  mgs  QDAY for  Depression  Continue  Remeron 7.5 mgrs QHS for depression and insomnia  Treatment team working on disposition options . Encourage ongoing milieu , group participation to work on coping skills and symptom reduction . Continue antihypertensive treatment , now on ASA 81 mgrs QDAY as prophylaxis . Request hospitalist input for further antihypertensive medication adjustment if indicated. D/C Midrin due to concerns it could adversely affect BP.   Neita Garnet, MD 05/02/2015, 1:48 PM

## 2015-05-03 DIAGNOSIS — I10 Essential (primary) hypertension: Secondary | ICD-10-CM

## 2015-05-03 LAB — BASIC METABOLIC PANEL WITH GFR
Anion gap: 12 (ref 5–15)
BUN: 19 mg/dL (ref 6–20)
CO2: 27 mmol/L (ref 22–32)
Calcium: 9.4 mg/dL (ref 8.9–10.3)
Chloride: 104 mmol/L (ref 101–111)
Creatinine, Ser: 0.93 mg/dL (ref 0.61–1.24)
GFR calc Af Amer: >60 mL/min (ref >60)
GFR calc non Af Amer: >60 mL/min (ref >60)
Glucose, Bld: 102 mg/dL — ABNORMAL HIGH (ref 65–99)
Potassium: 3.8 mmol/L (ref 3.5–5.1)
Sodium: 143 mmol/L (ref 135–145)

## 2015-05-03 LAB — GLUCOSE, CAPILLARY
GLUCOSE-CAPILLARY: 129 mg/dL — AB (ref 65–99)
Glucose-Capillary: 90 mg/dL (ref 65–99)

## 2015-05-03 MED ORDER — AMLODIPINE BESYLATE 10 MG PO TABS
10.0000 mg | ORAL_TABLET | Freq: Every day | ORAL | Status: DC
  Administered 2015-05-03 – 2015-05-07 (×5): 10 mg via ORAL
  Filled 2015-05-03 (×4): qty 1
  Filled 2015-05-03: qty 7
  Filled 2015-05-03 (×2): qty 1
  Filled 2015-05-03: qty 7

## 2015-05-03 MED ORDER — MAGNESIUM SULFATE 50 % IJ SOLN: 2.0000 g | Freq: Once | INTRAMUSCULAR | Status: DC

## 2015-05-03 MED ORDER — MIRTAZAPINE 15 MG PO TABS
15.0000 mg | ORAL_TABLET | Freq: Every day | ORAL | Status: DC
Start: 2015-05-03 — End: 2015-05-07
  Administered 2015-05-03 – 2015-05-06 (×4): 15 mg via ORAL
  Filled 2015-05-03 (×2): qty 1
  Filled 2015-05-03: qty 7
  Filled 2015-05-03 (×2): qty 1
  Filled 2015-05-03: qty 7

## 2015-05-03 NOTE — Progress Notes (Signed)
Patient ID: Jeffrey Marquez, male   DOB: [birth year], 55 y.o.   MRN: [id number] [facility] MD Progress Note  05/03/2015 4:28 PM Jeffrey Marquez  MRN:  [id number] Subjective:  Patient is reporting he still feels depressed, but " a little bit better".  At this time denies medication side effects. Denies suicidal ideations    Objective: I have discussed case with treatment team and have met with patient . Slow, gradual improvement of mood, less severely depressed than on admission , but still sad, ruminative. Staff has noted that patient is more interactive in groups and with other patients.  Denies suicidal ideations at this time. No disruptive or agitated behaviors on unit, going to some groups. Appreciate Hospitalist follow ups- BP improved today- 140/85.  Denies medication side effects.  Worries about disposition options / homelessness a major stressor for patient .   Principal Problem: Major depressive disorder, recurrent episode, severe ([place]) Diagnosis:   Patient Active Problem List   Diagnosis Date Noted  . Severe episode of recurrent major depressive disorder, without psychotic features ([place]) [F33.2]   . Cocaine abuse [F14.10] 04/27/2015  . Major depressive disorder, recurrent episode, severe ([place]) [F33.2] 04/27/2015  . Accelerated hypertension [I10]   . Blurry vision [H53.8]   . Hypertensive urgency [I16.0] 02/14/2015  . MDD (major depressive disorder), recurrent severe, without psychosis ([place]) [F33.2] 02/05/2015  . Unstable angina ([place]) [I20.0] 10/18/2014  . Essential hypertension, malignant [I10] 01/10/2014  . DM II (diabetes mellitus, type II), controlled ([place]) [E11.9] 11/27/2013   Total Time spent with patient: 20 minutes   Past Psychiatric History: See Above  Past Medical History:  Past Medical History  Diagnosis Date  . Diverticulitis   . Hypertension   . Diverticula of colon 2012  . Diabetes mellitus without complication ([place])   . Obesity   . Drug abuse   . Alcohol abuse     Past Surgical History  Procedure Laterality Date  . Cardiac catheterization N/A 10/18/2014    Procedure: Left Heart Cath and Coronary Angiography;  Surgeon: Sherren Mocha, MD;  Location: [place] [facility];  Service: Cardiovascular;  Laterality: N/A;   Family History:  Family History  Problem Relation Age of Onset  . Diabetes Mother   . Diabetes Brother    Family Psychiatric  History: Unknown Social History:  History  Alcohol Use  . Yes     History  Drug Use  . Yes  . Special: Cocaine    Social History   Social History  . Marital Status: Divorced    Spouse Name: N/A  . Number of Children: N/A  . Years of Education: N/A   Social History Main Topics  . Smoking status: Never Smoker   . Smokeless tobacco: Never Used  . Alcohol Use: Yes  . Drug Use: Yes    Special: Cocaine  . Sexual Activity: Not Asked   Other Topics Concern  . None   Social History Narrative   Additional Social History:   Sleep: improving   Appetite:  Improving   Current Medications: Current Facility-Administered Medications  Medication Dose Route Frequency Provider Last Rate Last Dose  . acetaminophen (TYLENOL) tablet 650 mg  650 mg Oral Q6H PRN Patrecia Pour, NP   650 mg at 05/02/15 1216  . alum & mag hydroxide-simeth (MAALOX/MYLANTA) 200-200-20 MG/5ML suspension 30 mL  30 mL Oral Q4H PRN Patrecia Pour, NP      . amLODipine (NORVASC) tablet 10 mg  10 mg Oral Daily Dawood S Elgergawy,  MD   10 mg at 05/03/15 1619  . aspirin EC tablet 81 mg  81 mg Oral Daily Maryann Mikhail, DO   81 mg at 05/03/15 0818  . atorvastatin (LIPITOR) tablet 10 mg  10 mg Oral Daily Patrecia Pour, NP   10 mg at 05/03/15 0818  . carvedilol (COREG) tablet 25 mg  25 mg Oral BID WC Patrecia Pour, NP   25 mg at 05/03/15 1619  . hydrALAZINE (APRESOLINE) tablet 100 mg  100 mg Oral TID Orson Eva, MD   100 mg at 05/03/15 1619  . irbesartan (AVAPRO) tablet 300 mg  300 mg Oral Daily Patrecia Pour, NP   300 mg at 05/03/15  0818   And  . hydrochlorothiazide (HYDRODIURIL) tablet 25 mg  25 mg Oral Daily Patrecia Pour, NP   25 mg at 05/03/15 0818  . LORazepam (ATIVAN) tablet 1 mg  1 mg Oral Q8H PRN Patrecia Pour, NP   1 mg at 04/29/15 2100  . magnesium hydroxide (MILK OF MAGNESIA) suspension 30 mL  30 mL Oral Daily PRN Patrecia Pour, NP      . metFORMIN (GLUCOPHAGE-XR) 24 hr tablet 500 mg  500 mg Oral Q breakfast Patrecia Pour, NP   500 mg at 05/03/15 0818  . mirtazapine (REMERON) tablet 7.5 mg  7.5 mg Oral QHS Myer Peer Bernadene Garside, MD   7.5 mg at 05/02/15 2116  . nitroGLYCERIN (NITROSTAT) SL tablet 0.4 mg  0.4 mg Sublingual Q5 min PRN Patrecia Pour, NP      . potassium chloride SA (K-DUR,KLOR-CON) CR tablet 20 mEq  20 mEq Oral Daily Patrecia Pour, NP   20 mEq at 05/03/15 0818  . sertraline (ZOLOFT) tablet 150 mg  150 mg Oral Daily Jenne Campus, MD   150 mg at 05/03/15 4431    Lab Results:  Results for orders placed or performed during the hospital encounter of 04/27/15 (from the past 48 hour(s))  Glucose, capillary     Status: Abnormal   Collection Time: 05/01/15  4:41 PM  Result Value Ref Range   Glucose-Capillary 113 (H) 65 - 99 mg/dL  Glucose, capillary     Status: Abnormal   Collection Time: 05/02/15  6:13 AM  Result Value Ref Range   Glucose-Capillary 111 (H) 65 - 99 mg/dL  Glucose, capillary     Status: Abnormal   Collection Time: 05/02/15  4:51 PM  Result Value Ref Range   Glucose-Capillary 113 (H) 65 - 99 mg/dL  Glucose, capillary     Status: None   Collection Time: 05/03/15  6:25 AM  Result Value Ref Range   Glucose-Capillary 90 65 - 99 mg/dL  Basic metabolic panel     Status: Abnormal   Collection Time: 05/03/15  6:40 AM  Result Value Ref Range   Sodium 143 135 - 145 mmol/L   Potassium 3.8 3.5 - 5.1 mmol/L   Chloride 104 101 - 111 mmol/L   CO2 27 22 - 32 mmol/L   Glucose, Bld 102 (H) 65 - 99 mg/dL   BUN 19 6 - 20 mg/dL   Creatinine, Ser 0.93 0.61 - 1.24 mg/dL   Calcium 9.4 8.9 -  10.3 mg/dL   GFR calc non Af Amer >60 >60 mL/min   GFR calc Af Amer >60 >60 mL/min    Comment: (NOTE) The eGFR has been calculated using the CKD EPI equation. This calculation has not been validated in all clinical situations. eGFR's  persistently <60 mL/min signify possible Chronic Kidney Disease.    Anion gap 12 5 - 15    Comment: Performed at [facility]    Blood Alcohol level:  Lab Results  Component Value Date   [facility] <5 04/27/2015   ETH <5 02/04/2015    Physical Findings: AIMS: Facial and Oral Movements Muscles of Facial Expression: None, normal Lips and Perioral Area: None, normal Jaw: None, normal Tongue: None, normal,Extremity Movements Upper (arms, wrists, hands, fingers): None, normal Lower (legs, knees, ankles, toes): None, normal, Trunk Movements Neck, shoulders, hips: None, normal, Overall Severity Severity of abnormal movements (highest score from questions above): None, normal Incapacitation due to abnormal movements: None, normal Patient's awareness of abnormal movements (rate only patient's report): No Awareness, Dental Status Current problems with teeth and/or dentures?: No Does patient usually wear dentures?: No  CIWA:    COWS:     Musculoskeletal: Strength & Muscle Tone: within normal limits Gait & Station: normal Patient leans: Right  Psychiatric Specialty Exam: Review of Systems  Psychiatric/Behavioral: Positive for depression, suicidal ideas and substance abuse. Negative for hallucinations. The patient is nervous/anxious and has insomnia.   All other systems reviewed and are negative. no chest pain or shortness of breath at room air at this time , denies epigastric discomfort or GI bleed   Blood pressure 140/85, pulse 70, temperature 98.5 F (36.9 C), temperature source Oral, resp. rate 16, height 5' 9.4" (1.763 m), weight 262 lb (118.842 kg), SpO2 95 %.Body mass index is 38.24 kg/(m^2).  General Appearance:fairly groomed   Eye  Contact::  Good   Speech:  Normal   Volume:  Normal  Mood:  Still depressed , but some improvement compared to admission   Affect: constricted   Thought Process:  Linear  Orientation:  Full (Time, Place, and Person)  Thought Content:  Denies  hallucinations, no delusions , not internally preoccupied   Suicidal Thoughts: at this time denies  any suicidal ideations , denies self injurious ideations   Homicidal Thoughts:  No  Memory:  Recent and remote grossly intact   Judgement:   Improving   Insight:   Improving   Psychomotor Activity: still decreased but more visible on unit than on admission  Concentration:  Good  Recall:  Good  Fund of Knowledge:Good  Language: Good  Akathisia:  No  Handed:  Right  AIMS (if indicated):     Assets:  Housing Social Support  ADL's:  Intact  Cognition: WNL  Sleep:  Number of Hours: 6.75   Assessment - patient partially improved compared to admission- less severely depressed. Denies medication side effects. BP improved today. Remains ruminative about stressors, mainly homelessness .  Treatment Plan Summary: Daily contact with patient to assess and evaluate symptoms and progress in treatment and Medication management Continue   Zoloft  150  mgs  QDAY for  Depression  Increase   Remeron to 15  mgrs QHS for depression and insomnia  Treatment team working on disposition options . Encourage ongoing milieu , group participation to work on coping skills and symptom reduction . Continue antihypertensive treatment , now on ASA 81 mgrs QDAY as prophylaxis .   Neita Garnet, MD 05/03/2015, 4:28 PM

## 2015-05-03 NOTE — Progress Notes (Signed)
Patient ID: Nicholaus CorollaGary Ditmars, male   DOB: [birth year], 55 y.o.   MRN: [id number] Adult Psychoeducational Group Note  Date:  05/03/2015 Time: 09:00am  Group Topic/Focus:  Making Healthy Choices:   The focus of this group is to help patients identify negative/unhealthy choices they were using prior to admission and identify positive/healthier coping strategies to replace them upon discharge.  Participation Level:  Active  Participation Quality:  Appropriate  Affect:  Blunted  Cognitive:  Alert and Oriented  Insight: Improving  Engagement in Group:  Improving  Modes of Intervention:  Discussion, Education and Support  Additional Comments:    Aurora Maskwyman, Decoda Van E 05/03/2015, 9:43 AM

## 2015-05-03 NOTE — Progress Notes (Signed)
Patient ID: Jeffrey CorollaGary Marquez, male   DOB: [birth year], 55 y.o.   MRN: [id number]  Pt currently presents with a flat affect and depressed behavior. Per self inventory, pt rates depression at a 7, hopelessness 8 and anxiety 8. Pt's daily goal is to "try to control racing thoughts." Pt reports fair sleep, a fair appetite, low energy and "jumping" concentration. Pt affect brighter, remains interactive with other pts today.   Pt provided with medications per providers orders. Pt's labs and vitals were monitored throughout the day. Pt supported emotionally and encouraged to express concerns and questions. Pt educated on medications.  Pt's safety ensured with 15 minute and environmental checks. Pt currently endorses SI to Clinical research associatewriter. Pt denies HI and A/V hallucinations. No current plan voiced. Pt verbally agrees to seek staff if SI worsens, HI or A/VH occurs and to consult with staff before acting on any harmful thoughts. Will continue POC.

## 2015-05-03 NOTE — Progress Notes (Signed)
Patient Demographics  Buren Havey, is a 55 y.o. male, DOB - [birth year], WUJ:[id number]  Admit date - 04/27/2015   Admitting Physician Craige Cotta, MD  Outpatient Primary MD for the patient is PLACEY,MARY H, NP  LOS - 6   No chief complaint on file.        Subjective:   Cheo Selvey today has, No headache, No chest pain, No abdominal pain - N  Assessment & Plan    Principal Problem:   Major depressive disorder, recurrent episode, severe (HCC) Active Problems:   Accelerated hypertension   Cocaine abuse   Severe episode of recurrent major depressive disorder, without psychotic features (H[facility])  Hypertension -Poorly controlled despite carvedilol, hydralazine, ARB/HCTZ -Hydralazine was increased from 50 mg 3 times a day to 1 mg 3 times a day, remains uncontrolled, so we'll start on amlodipine.  Diabetes mellitus type 2 -02/14/2015 hemoglobin A1c--6.2 -Continue metformin -discussed lifestyle modification and weight loss  Polysubstance abuse -Including tobacco and cocaine  MDD -per psychiatry    Medications  Scheduled Meds: . amLODipine  10 mg Oral Daily  . aspirin EC  81 mg Oral Daily  . atorvastatin  10 mg Oral Daily  . carvedilol  25 mg Oral BID WC  . hydrALAZINE  100 mg Oral TID  . irbesartan  300 mg Oral Daily   And  . hydrochlorothiazide  25 mg Oral Daily  . metFORMIN  500 mg Oral Q breakfast  . mirtazapine  7.5 mg Oral QHS  . potassium chloride  20 mEq Oral Daily  . sertraline  150 mg Oral Daily   Continuous Infusions:  PRN Meds:.acetaminophen, alum & mag hydroxide-simeth, LORazepam, magnesium hydroxide, nitroGLYCERIN    Lab Results  Component Value Date   PLT 229 04/27/2015    Antibiotics    Anti-infectives    None          Objective:   Filed Vitals:   05/03/15 0614 05/03/15 0817 05/03/15 1145 05/03/15 1617  BP: 162/103 163/96 149/97 140/85    Pulse: 71  65 70  Temp:      TempSrc:      Resp:      Height:      Weight:      SpO2:        Wt Readings from Last 3 Encounters:  04/27/15 118.842 kg (262 lb)  02/05/15 127.461 kg (281 lb)  02/04/15 127.659 kg (281 lb 7 oz)    No intake or output data in the 24 hours ending 05/03/15 1652   Physical Exam  Awake Alert, Oriented X 3 Supple Neck,No JVD Symmetrical Chest wall movement, Good air movement bilaterally RRR,No Gallops,Rubs or new Murmurs, No Parasternal Heave +ve B.Sounds, Abd Soft, No tenderness, No organomegaly appriciated No Cyanosis, Clubbing or edema, No new Rash or bruise     Data Review   Micro Results No results found for this or any previous visit (from the past 240 hour(s)).  Radiology Reports No results found.   CBC  Recent Labs Lab 04/27/15 1000  WBC 11.5*  HGB 14.5  HCT 42.9  PLT 229  MCV 87.7  MCH 29.7  MCHC 33.8  RDW 13.9    Chemistries   Recent Labs Lab 04/27/15 1000 05/03/15  0640  NA 140 143  K 3.7 3.8  CL 101 104  CO2 27 27  GLUCOSE 140* 102*  BUN 13 19  CREATININE 1.20 0.93  CALCIUM 9.3 9.4  AST 17  --   ALT 17  --   ALKPHOS 79  --   BILITOT 1.1  --    ------------------------------------------------------------------------------------------------------------------ estimated creatinine clearance is 118.9 mL/min (by C-G formula based on Cr of 0.93). ------------------------------------------------------------------------------------------------------------------ No results for input(s): HGBA1C in the last 72 hours. ------------------------------------------------------------------------------------------------------------------ No results for input(s): CHOL, HDL, LDLCALC, TRIG, CHOLHDL, LDLDIRECT in the last 72 hours. ------------------------------------------------------------------------------------------------------------------ No results for input(s): TSH, T4TOTAL, T3FREE, THYROIDAB in the last 72  hours.  Invalid input(s): FREET3 ------------------------------------------------------------------------------------------------------------------ No results for input(s): VITAMINB12, FOLATE, FERRITIN, TIBC, IRON, RETICCTPCT in the last 72 hours.  Coagulation profile No results for input(s): INR, PROTIME in the last 168 hours.  No results for input(s): DDIMER in the last 72 hours.  Cardiac Enzymes No results for input(s): CKMB, TROPONINI, MYOGLOBIN in the last 168 hours.  Invalid input(s): CK ------------------------------------------------------------------------------------------------------------------ Invalid input(s): POCBNP     Time Spent in minutes   25 minutes   Haven Foss M.D on 05/03/2015 at 4:52 PM  Between 7am to 7pm - Pager - [phone]  After 7pm go to [email or web address] - password [email or web address]  [facility]   Office  [phone]

## 2015-05-03 NOTE — [facility] Group Notes (Signed)
[facility] LCSW Group Therapy 05/03/2015 1:15 PM Type of Therapy: Group Therapy Participation Level: Active  Participation Quality: Attentive, Sharing and Supportive  Affect: Blunted  Cognitive: Alert and Oriented  Insight: Developing/Improving and Engaged  Engagement in Therapy: Developing/Improving and Engaged  Modes of Intervention: Activity, Clarification, Confrontation, Discussion, Education, Exploration, Limit-setting, Orientation, Problem-solving, Rapport Building, Reality Testing, Socialization and Support  Summary of Progress/Problems: Patient was attentive and engaged with speaker from [employer]. Patient was attentive to speaker while they shared their story of dealing with mental health and overcoming it. Patient expressed interest in their programs and services and received information on their agency. Patient processed ways they can relate to the speaker.   Jumanah Hynson, LCSW Clinical Social Worker [facility] [phone]   

## 2015-05-03 NOTE — Progress Notes (Signed)
Pt reports his day was "so-so".  He is worrying about where he will go at discharge.  He says he cannot return to where he was staying prior to his admission.  [place]  He is still having passive suicidal thoughts.  He denies HI/AVH.  At the time of assessment, he is sitting in the dayroom watching TV and occasionally talking with peers.  He has a flat/blank look on his face.  He was encouraged to discuss his concerns with his CSW.  Support and encouragement offered.  Discharge plans are in process.  Safety maintained with q15 minute checks.

## 2015-05-03 NOTE — Progress Notes (Signed)
Nutrition Education Note  Pt attended group focusing on general, healthful nutrition education.  RD emphasized the importance of eating regular meals and snacks throughout the day. Consuming sugar-free beverages and incorporating fruits and vegetables into diet when possible. Provided examples of healthy snacks. Patient encouraged to leave group with a goal to improve nutrition/healthy eating.   Diet Order: Diet Heart Room service appropriate?: Yes; Fluid consistency:: Thin Pt is also offered choice of unit snacks mid-morning and mid-afternoon.  Pt is eating as desired.   If additional nutrition issues arise, please consult RD.   Dajah Fischman, RD, LDN Inpatient Clinical Dietitian Pager # [phone] After hours/weekend pager # [phone]     

## 2015-05-04 LAB — GLUCOSE, CAPILLARY
GLUCOSE-CAPILLARY: 162 mg/dL — AB (ref 65–99)
GLUCOSE-CAPILLARY: 119 mg/dL — AB (ref 65–99)
Glucose-Capillary: 109 mg/dL — ABNORMAL HIGH (ref 65–99)

## 2015-05-04 NOTE — Tx Team (Signed)
Interdisciplinary Treatment Plan Update (Adult) Date: 05/04/2015   Time Reviewed: 9:30 AM  Progress in Treatment: Attending groups: Intermittently Participating in groups: Minimally Taking medication as prescribed: Yes Tolerating medication: Yes Family/Significant other contact made: No, unsuccessful attempts to contact daughter Patient understands diagnosis: Yes Discussing patient identified problems/goals with staff: Yes Medical problems stabilized or resolved: Yes Denies suicidal/homicidal ideation: Yes, today Issues/concerns per patient self-inventory: Yes Other:  New problem(s) identified: N/A  Discharge Plan or Barriers:  Patient is homeless, unsure of where he will go at d/c.  05/04/15: Pt continues to be unsure about discharge plan; no place to go at d/c.    Reason for Continuation of Hospitalization:  Depression Anxiety Medication Stabilization   Comments: N/A  Estimated length of stay: 2-3 days     Review of initial/current patient goals per problem list:  1. Goal(s): Patient will participate in aftercare plan   Met: No   Target date: 3-5 days post admission date   As evidenced by: Patient will participate within aftercare plan AEB aftercare provider and housing plan at discharge being identified.  3/27: Goal not met: CSW assessing for appropriate referrals for pt and will have follow up secured prior to d/c.     2. Goal (s): Patient will exhibit decreased depressive symptoms and suicidal ideations.   Met: No   Target date: 3-5 days post admission date   As evidenced by: Patient will utilize self rating of depression at 3 or below and demonstrate decreased signs of depression or be deemed stable for discharge by MD.   3/27: Patient rates depression at 9.  3/31: Pt rates depression at 9/10.   3. Goal(s): Patient will demonstrate decreased signs and symptoms of anxiety.   Met:  No   Target date: 3-5 days post admission date   As  evidenced by: Patient will utilize self rating of anxiety at 3 or below and demonstrated decreased signs of anxiety, or be deemed stable for discharge by MD  3/31: Patient rates anxiety at 8.    4. Goal(s): Patient will demonstrate decreased signs of withdrawal due to substance abuse   Met: Yes   Target date: 3-5 days post admission date   As evidenced by: Patient will produce a CIWA/COWS score of 0, have stable vitals signs, and no symptoms of withdrawal  3/27: Goal met. No withdrawal symptoms reported at this time per medical chart.    Attendees: Patient:    Family:    Physician: Dr. Parke Poisson; Dr. Sabra Heck 04/30/2015 9:30 AM  Nursing: Marcella Dubs, RN; Vira Browns, RN 04/30/2015 9:30 AM  Clinical Social Worker: Tilden Fossa, LCSW 04/30/2015 9:30 AM  Other: Peri Maris, LCSWA; [place], LCSW  04/30/2015 9:30 AM  Other: Norberto Sorenson, P4CC 04/30/2015 9:30 AM  Other: Lars Pinks, Case Manager 04/30/2015 9:30 AM  Other: Agustina Caroli, May Augustin, NP 04/30/2015 9:30 AM  Other:             Scribe for Treatment Team:  Tilden Fossa, [place]

## 2015-05-04 NOTE — Progress Notes (Signed)
Patient ID: Nicholaus CorollaGary Vencill, male   DOB: 1960/05/20, 55 y.o.   MRN: [id number]   Pt currently presents with a blunted affect and depressed behavior. Per self inventory, pt rates depression at a 9 and hopelessness 8. Pt's daily goal is to "get meds adjusted and stable." Pt reports "did not sleep well at all last night, meds had me feeling funny, could not sleep even with sleep meds." Pt reports poor sleep, a fair appetite, low energy and poor concentration.   Pt provided with medications per providers orders. Pt's labs and vitals were monitored throughout the day. Pt supported emotionally and encouraged to express concerns and questions. Pt educated on medications and alternative sleep methods. MD notified of pts medication concerns.  Pt's safety ensured with 15 minute and environmental checks. Pt endorses no SI today. Pt currently denies HI and A/V hallucinations. Pt verbally agrees to seek staff if HI or A/VH occurs and to consult with staff before acting on any harmful thoughts. Will continue POC.

## 2015-05-04 NOTE — [facility] Group Notes (Signed)
[facility] Note  05/04/2015 8:45 AM  Participation Quality: Alert, Appropriate and Oriented  Mood/Affect: Flat; Depressed  Depression Rating: 9  Anxiety Rating: 7  Thoughts of Suicide: Pt denies SI/HI  Will you contract for safety? Yes  Current AVH: Pt denies  Plan for Discharge/Comments: Pt attended discharge planning group and actively participated in group. CSW discussed suicide prevention education with the group and encouraged them to discuss discharge planning and any relevant barriers. Pt was appropriate in group discussion, however reports high levels of depression. No needs expressed at this time.  Transportation Means: Pt reports access to transportation  Supports: No supports mentioned at this time  Chad CordialLauren Carter, LCSWA 05/04/2015 9:25 AM

## 2015-05-04 NOTE — Progress Notes (Addendum)
Patient Demographics  Jeffrey Marquez, is a 55 y.o. male, DOB - [birth year], ZOX:[id number]  Admit date - 04/27/2015   Admitting Physician Craige CottaFernando A Cobos, MD  Outpatient Primary MD for the patient is PLACEY,MARY H, NP  LOS - 7   No chief complaint on file.        Subjective:   Jeffrey Marquez today has, No headache, No chest pain, No abdominal pain , Reports poor night sleep after his (dose was decreased.  Assessment & Plan    Principal Problem:   Major depressive disorder, recurrent episode, severe (HCC) Active Problems:   Accelerated hypertension   Cocaine abuse   Severe episode of recurrent major depressive disorder, without psychotic features (HCC)  Hypertension -Poorly controlled despite carvedilol, hydralazine, ARB/HCTZ -Hydralazine was increased from 50 mg 3 times a day to 1 mg 3 times a day On 3/29 , Norvasc was added on 3/30 as it remains uncontrolled , blood pressure been acceptable since, will continue to observe on current regimen, and if needed can add Imdur.  Diabetes mellitus type 2 -02/14/2015 hemoglobin A1c--6.2 -Continue metformin -discussed lifestyle modification and weight loss  Polysubstance abuse -Including tobacco and cocaine  MDD -per psychiatry   We'll sign off right now, please contact us if any further question arises or needed to resume follow-up.  Medications  Scheduled Meds: . amLODipine  10 mg Oral Daily  . aspirin EC  81 mg Oral Daily  . atorvastatin  10 mg Oral Daily  . carvedilol  25 mg Oral BID WC  . hydrALAZINE  100 mg Oral TID  . irbesartan  300 mg Oral Daily   And  . hydrochlorothiazide  25 mg Oral Daily  . metFORMIN  500 mg Oral Q breakfast  . mirtazapine  15 mg Oral QHS  . potassium chloride  20 mEq Oral Daily  . sertraline  150 mg Oral Daily   Continuous Infusions:  PRN Meds:.acetaminophen, alum & mag hydroxide-simeth, LORazepam,  magnesium hydroxide, nitroGLYCERIN    Lab Results  Component Value Date   PLT 229 04/27/2015    Antibiotics    Anti-infectives    None          Objective:   Filed Vitals:   05/03/15 1617 05/04/15 0900 05/04/15 0904 05/04/15 1208  BP: 140/85 157/98 145/94 145/81  Pulse: 70 62 75   Temp:  98.6 F (37 C)    TempSrc:  Oral    Resp:  20    Height:      Weight:      SpO2:        Wt Readings from Last 3 Encounters:  04/27/15 118.842 kg (262 lb)  02/05/15 127.461 kg (281 lb)  02/04/15 127.659 kg (281 lb 7 oz)    No intake or output data in the 24 hours ending 05/04/15 1542   Physical Exam  Awake Alert, Oriented X 3 Supple Neck,No JVD Symmetrical Chest wall movement, Good air movement bilaterally RRR,No Gallops,Rubs or new Murmurs, No Parasternal Heave +ve B.Sounds, Abd Soft, No tenderness, No organomegaly appriciated No Cyanosis, Clubbing or edema, No new Rash or bruise     Data Review   Micro Results No results found for this or any previous visit (from the past 240  hour(s)).  Radiology Reports No results found.   CBC No results for input(s): WBC, HGB, HCT, PLT, MCV, MCH, MCHC, RDW, LYMPHSABS, MONOABS, EOSABS, BASOSABS, BANDABS in the last 168 hours.  Invalid input(s): NEUTRABS, BANDSABD  Chemistries   Recent Labs Lab 05/03/15 0640  NA 143  K 3.8  CL 104  CO2 27  GLUCOSE 102*  BUN 19  CREATININE 0.93  CALCIUM 9.4   ------------------------------------------------------------------------------------------------------------------ estimated creatinine clearance is 118.9 mL/min (by C-G formula based on Cr of 0.93). ------------------------------------------------------------------------------------------------------------------ No results for input(s): HGBA1C in the last 72 hours. ------------------------------------------------------------------------------------------------------------------ No results for input(s): CHOL, HDL, LDLCALC, TRIG,  CHOLHDL, LDLDIRECT in the last 72 hours. ------------------------------------------------------------------------------------------------------------------ No results for input(s): TSH, T4TOTAL, T3FREE, THYROIDAB in the last 72 hours.  Invalid input(s): FREET3 ------------------------------------------------------------------------------------------------------------------ No results for input(s): VITAMINB12, FOLATE, FERRITIN, TIBC, IRON, RETICCTPCT in the last 72 hours.  Coagulation profile No results for input(s): INR, PROTIME in the last 168 hours.  No results for input(s): DDIMER in the last 72 hours.  Cardiac Enzymes No results for input(s): CKMB, TROPONINI, MYOGLOBIN in the last 168 hours.  Invalid input(s): CK ------------------------------------------------------------------------------------------------------------------ Invalid input(s): POCBNP     Time Spent in minutes   20 minutes   Christel Bai M.D on 05/04/2015 at 3:42 PM  Between 7am to 7pm - Pager - [phone]  After 7pm go to [email or web address] - password [facility]  [facility]   Office  [phone]

## 2015-05-04 NOTE — [facility] Group Notes (Signed)
[facility] LCSW Group Therapy 05/04/2015 1:15pm  Type of Therapy: Group Therapy- Feelings Around Relapse and Recovery  Pt did not attend, declined invitation.   Chad CordialLauren Carter, Theresia MajorsLCSWA [phone] 05/04/2015 3:16 PM

## 2015-05-04 NOTE — Progress Notes (Signed)
Patient ID: Jeffrey Marquez, male   DOB: 30-Jan-1961, 55 y.o.   MRN: [id number] [facility] MD Progress Note  05/04/2015 2:28 PM Jeffrey Marquez  MRN:  [id number] Subjective: Patient reporting persistent depression, but no current suicidal ideations . He does state he is starting to feel " a little better". He denies medication side effects at this time .  Objective: I have discussed case with treatment team and have met with patient . Presents with depressed, constricted affect, but does smile at times appropriately. Describes he feels his mood, although still depressed, is starting to improve . Today more future oriented, and better able to discuss tangible /possible disposition options. As noted, losing shelter bed /homelessness has been major stressor. Expressing some interest in [employer] or similar . We have also discussed the importance of avoiding alcohol / cocaine/ illicit drugs as integral part of treatment goals. No disruptive or agitated behaviors on unit, isolative in room at times  BP continues to improve compared to prior- today 145/81.  Denies medication side effects.    Principal Problem: Major depressive disorder, recurrent episode, severe ([place]) Diagnosis:   Patient Active Problem List   Diagnosis Date Noted  . Severe episode of recurrent major depressive disorder, without psychotic features ([place]) [F33.2]   . Cocaine abuse [F14.10] 04/27/2015  . Major depressive disorder, recurrent episode, severe ([place]) [F33.2] 04/27/2015  . Accelerated hypertension [I10]   . Blurry vision [H53.8]   . Hypertensive urgency [I16.0] 02/14/2015  . MDD (major depressive disorder), recurrent severe, without psychosis ([place]) [F33.2] 02/05/2015  . Unstable angina ([place]) [I20.0] 10/18/2014  . Essential hypertension, malignant [I10] 01/10/2014  . DM II (diabetes mellitus, type II), controlled ([place]) [E11.9] 11/27/2013   Total Time spent with patient: 20 minutes   Past Psychiatric History: See  Above  Past Medical History:  Past Medical History  Diagnosis Date  . Diverticulitis   . Hypertension   . Diverticula of colon 2012  . Diabetes mellitus without complication ([place])   . Obesity   . Drug abuse   . Alcohol abuse     Past Surgical History  Procedure Laterality Date  . Cardiac catheterization N/A 10/18/2014    Procedure: Left Heart Cath and Coronary Angiography;  Surgeon: Sherren Mocha, MD;  Location: [place] [facility];  Service: Cardiovascular;  Laterality: N/A;   Family History:  Family History  Problem Relation Age of Onset  . Diabetes Mother   . Diabetes Brother    Family Psychiatric  History: Unknown Social History:  History  Alcohol Use  . Yes     History  Drug Use  . Yes  . Special: Cocaine    Social History   Social History  . Marital Status: Divorced    Spouse Name: N/A  . Number of Children: N/A  . Years of Education: N/A   Social History Main Topics  . Smoking status: Never Smoker   . Smokeless tobacco: Never Used  . Alcohol Use: Yes  . Drug Use: Yes    Special: Cocaine  . Sexual Activity: Not Asked   Other Topics Concern  . None   Social History Narrative   Additional Social History:   Sleep: improving   Appetite:  Improving   Current Medications: Current Facility-Administered Medications  Medication Dose Route Frequency Provider Last Rate Last Dose  . acetaminophen (TYLENOL) tablet 650 mg  650 mg Oral Q6H PRN Patrecia Pour, NP   650 mg at 05/02/15 1216  . alum & mag hydroxide-simeth (  MAALOX/MYLANTA) 200-200-20 MG/5ML suspension 30 mL  30 mL Oral Q4H PRN Patrecia Pour, NP      . amLODipine (NORVASC) tablet 10 mg  10 mg Oral Daily Albertine Patricia, MD   10 mg at 05/04/15 0906  . aspirin EC tablet 81 mg  81 mg Oral Daily Maryann Mikhail, DO   81 mg at 05/04/15 0906  . atorvastatin (LIPITOR) tablet 10 mg  10 mg Oral Daily Patrecia Pour, NP   10 mg at 05/04/15 0906  . carvedilol (COREG) tablet 25 mg  25 mg Oral BID WC  Patrecia Pour, NP   25 mg at 05/04/15 0906  . hydrALAZINE (APRESOLINE) tablet 100 mg  100 mg Oral TID Orson Eva, MD   100 mg at 05/04/15 1208  . irbesartan (AVAPRO) tablet 300 mg  300 mg Oral Daily Patrecia Pour, NP   300 mg at 05/04/15 6168   And  . hydrochlorothiazide (HYDRODIURIL) tablet 25 mg  25 mg Oral Daily Patrecia Pour, NP   25 mg at 05/04/15 0906  . LORazepam (ATIVAN) tablet 1 mg  1 mg Oral Q8H PRN Patrecia Pour, NP   1 mg at 04/29/15 2100  . magnesium hydroxide (MILK OF MAGNESIA) suspension 30 mL  30 mL Oral Daily PRN Patrecia Pour, NP      . metFORMIN (GLUCOPHAGE-XR) 24 hr tablet 500 mg  500 mg Oral Q breakfast Patrecia Pour, NP   500 mg at 05/04/15 0905  . mirtazapine (REMERON) tablet 15 mg  15 mg Oral QHS Jenne Campus, MD   15 mg at 05/03/15 2147  . nitroGLYCERIN (NITROSTAT) SL tablet 0.4 mg  0.4 mg Sublingual Q5 min PRN Patrecia Pour, NP      . potassium chloride SA (K-DUR,KLOR-CON) CR tablet 20 mEq  20 mEq Oral Daily Patrecia Pour, NP   20 mEq at 05/04/15 0906  . sertraline (ZOLOFT) tablet 150 mg  150 mg Oral Daily Jenne Campus, MD   150 mg at 05/04/15 0906    Lab Results:  Results for orders placed or performed during the hospital encounter of 04/27/15 (from the past 48 hour(s))  Glucose, capillary     Status: Abnormal   Collection Time: 05/02/15  4:51 PM  Result Value Ref Range   Glucose-Capillary 113 (H) 65 - 99 mg/dL  Glucose, capillary     Status: None   Collection Time: 05/03/15  6:25 AM  Result Value Ref Range   Glucose-Capillary 90 65 - 99 mg/dL  Basic metabolic panel     Status: Abnormal   Collection Time: 05/03/15  6:40 AM  Result Value Ref Range   Sodium 143 135 - 145 mmol/L   Potassium 3.8 3.5 - 5.1 mmol/L   Chloride 104 101 - 111 mmol/L   CO2 27 22 - 32 mmol/L   Glucose, Bld 102 (H) 65 - 99 mg/dL   BUN 19 6 - 20 mg/dL   Creatinine, Ser 0.93 0.61 - 1.24 mg/dL   Calcium 9.4 8.9 - 10.3 mg/dL   GFR calc non Af Amer >60 >60 mL/min   GFR calc  Af Amer >60 >60 mL/min    Comment: (NOTE) The eGFR has been calculated using the CKD EPI equation. This calculation has not been validated in all clinical situations. eGFR's persistently <60 mL/min signify possible Chronic Kidney Disease.    Anion gap 12 5 - 15    Comment: Performed at [facility]  Glucose, capillary     Status: Abnormal   Collection Time: 05/03/15  4:51 PM  Result Value Ref Range   Glucose-Capillary 129 (H) 65 - 99 mg/dL  Glucose, capillary     Status: Abnormal   Collection Time: 05/04/15  6:17 AM  Result Value Ref Range   Glucose-Capillary 109 (H) 65 - 99 mg/dL   Comment 1 Notify RN     Blood Alcohol level:  Lab Results  Component Value Date   ETH <5 04/27/2015   ETH <5 02/04/2015    Physical Findings: AIMS: Facial and Oral Movements Muscles of Facial Expression: None, normal Lips and Perioral Area: None, normal Jaw: None, normal Tongue: None, normal,Extremity Movements Upper (arms, wrists, hands, fingers): None, normal Lower (legs, knees, ankles, toes): None, normal, Trunk Movements Neck, shoulders, hips: None, normal, Overall Severity Severity of abnormal movements (highest score from questions above): None, normal Incapacitation due to abnormal movements: None, normal Patient's awareness of abnormal movements (rate only patient's report): No Awareness, Dental Status Current problems with teeth and/or dentures?: No Does patient usually wear dentures?: No  CIWA:    COWS:     Musculoskeletal: Strength & Muscle Tone: within normal limits Gait & Station: normal Patient leans: Right  Psychiatric Specialty Exam: Review of Systems  Psychiatric/Behavioral: Positive for depression, suicidal ideas and substance abuse. Negative for hallucinations. The patient is nervous/anxious and has insomnia.   All other systems reviewed and are negative. no chest pain or shortness of breath at room air at this time , denies epigastric discomfort or  GI bleed   Blood pressure 145/81, pulse 75, temperature 98.6 F (37 C), temperature source Oral, resp. rate 20, height 5' 9.4" (1.763 m), weight 262 lb (118.842 kg), SpO2 95 %.Body mass index is 38.24 kg/(m^2).  General Appearance:fairly groomed   Eye Contact::  Good   Speech:  Normal   Volume:  Normal  Mood:  Remains depressed, but gradual/partial  improvement noted   Affect:  Remains constricted   Thought Process:  Linear  Orientation:  Full (Time, Place, and Person)  Thought Content:  Denies  hallucinations, no delusions , not internally preoccupied   Suicidal Thoughts: at this time denies  any suicidal ideations , denies self injurious ideations   Homicidal Thoughts:  No  Memory:  Recent and remote grossly intact   Judgement:   Improving   Insight:   Improving   Psychomotor Activity: still decreased but more visible on unit than on admission  Concentration:  Good  Recall:  [place] of Knowledge:Good  Language: Good  Akathisia:  No  Handed:  Right  AIMS (if indicated):     Assets:  Housing Social Support  ADL's:  Intact  Cognition: WNL  Sleep:  Number of Hours: 6.75   Assessment - patient continues to feel depressed, sad, but today does acknowledge some improvement of mood, and is gradually becoming more future oriented, and better able to focus on /discuss disposition planning options . Thus far tolerating Remeron and Zoloft well .   Treatment Plan Summary: Daily contact with patient to assess and evaluate symptoms and progress in treatment and Medication management Continue   Zoloft  150  mgs  QDAY for  Depression  Continue    Remeron to 15  mgrs QHS for depression and insomnia  Treatment team working on disposition options . Encourage ongoing milieu , group participation to work on coping skills and symptom reduction . Continue antihypertensive treatment , now on ASA 81 mgrs  QDAY as prophylaxis .   Neita Garnet, MD 05/04/2015, 2:28 PM

## 2015-05-04 NOTE — Progress Notes (Signed)
Adult Psychoeducational Group Note  Date:  05/04/2015 Time:  8:55 PM  Group Topic/Focus:  Wrap-Up Group:   The focus of this group is to help patients review their daily goal of treatment and discuss progress on daily workbooks.  Participation Level:  Active  Participation Quality:  Appropriate  Affect:  Appropriate  Cognitive:  Alert  Insight: Appropriate  Engagement in Group:  Engaged  Modes of Intervention:  Discussion  Additional Comments:  Patient stated having an okay day. Patient goal for today was to get ready for his discharge on Monday. On a scale between 1-10, (1=worse, 10=best) patient rated his day a 5.  Klyde Banka L Crue Otero 05/04/2015, 8:55 PM

## 2015-05-04 NOTE — Progress Notes (Signed)
Recreation Therapy Notes  Date: 03.31.2017 Time: 9:30am Location: [address] Group Room   Group Topic: Stress Management  Goal Area(s) Addresses:  Patient will actively participate in stress management techniques presented during session.   Behavioral Response: Did not attend.   Marvie Brevik L Kalyssa Anker, LRT/CTRS        Auset Fritzler L 05/04/2015 2:05 PM 

## 2015-05-04 NOTE — Progress Notes (Signed)
Patient ID: Jeffrey CorollaGary Marquez, male   DOB: [birth year], 55 y.o.   MRN: [id number] D: Client is pleasant, reports "been praying, it works" "be leaving tomorrow" No reports of SHI. A: Writer encouraged client to continue to be positive. Medications reviewed, administered as ordered. Staff will monitor q6515min for safety. R:Client is safe on the unit.

## 2015-05-04 NOTE — Progress Notes (Signed)
D: Patient observed in milieu watching television. Patient states his goal is to "get medications adjusted."  A: Support and encouragement offered. Q 15 minute checks in progress and maintained for safety. R: Patient remains safe on unit.

## 2015-05-05 LAB — GLUCOSE, CAPILLARY
Glucose-Capillary: 97 mg/dL (ref 65–99)
Glucose-Capillary: 131 mg/dL — ABNORMAL HIGH (ref 65–99)

## 2015-05-05 MED ORDER — SERTRALINE HCL 50 MG PO TABS
150.0000 mg | ORAL_TABLET | Freq: Every day | ORAL | Status: DC
  Administered 2015-05-05: 150 mg via ORAL
  Filled 2015-05-05 (×2): qty 21

## 2015-05-05 NOTE — Progress Notes (Signed)
Patient has been up and active on the unit, attended group this evening and has voiced no complaints.  He requested his medication a little eary reporting that he wants to go to bed early. Patient currently denies having pain, -si/hi/a/v hall. Support and encouragement offered, safety maintained on unit, will continue to monitor.

## 2015-05-05 NOTE — Progress Notes (Signed)
D: Patient continues to express severe depressive symptoms.  He rates his depression and hopelessness as an 8.  He is having passive suicidal thoughts, however, he contracts for safety on the unit.  He presents with flat, blunted affect.  His mood is sad, depressed.  He is compliant with his medication.  Losing his shelter bed was a stressor for him.  [facility]. A: Continue to monitor medication management and MD orders.  Safety checks completed every 15 minutes per protocol.  Offer support and encouragement as needed. R: Patient is receptive to staff; his behavior is appropriate.

## 2015-05-05 NOTE — [facility] Group Notes (Signed)
[facility] Group Notes:  (Nursing/MHT/Case Management/Adjunct)  Date:  05/05/2015  Time:  1315  Type of Therapy:  Nurse Education  /  Life Skills : The group focuses on teaching patients how to identify their needs as well as develop healthy skills needed to get their needs met.  Participation Level:  Active  Participation Quality:  Appropriate  Affect:  Appropriate  Cognitive:  Alert  Insight:  Appropriate  Engagement in Group:  Engaged  Modes of Intervention:  Education  Summary of Progress/Problems:  Jeffrey Marquez 05/05/2015, 2:14 PM

## 2015-05-05 NOTE — [facility] Group Notes (Signed)
[facility] LCSW Group Therapy  05/05/2015 / 10 AM  Type of Therapy:  Group Therapy  Participation Level:  Active  Participation Quality:  Drowsy and Sharing  Affect:  Depressed  Cognitive:  Oriented  Insight:  Developing  Engagement in Therapy:  Developing  Modes of Intervention:  Discussion, Exploration, Rapport Building, Socialization and Support  Summary of Progress/Problems:   Summary of Progress/Problems: The main focus of today's process group was for the patient to identify ways in which they have in the past sabotaged their own recovery. Motivational Interviewing was utilized to ask the group members what they get out of their self sabotaging behavior(s), and what reasons they may have for wanting to change. The Stages of Change were explained using a handout, and patients identified where they currently are with regard to stages of change. Patient shared that he is in preparation stage and is aware he needs to spend more time with others verses patterns of isolation   Carney Bernatherine C Harrill, LCSW

## 2015-05-05 NOTE — Progress Notes (Signed)
[facility] Note  05/05/2015 1:25 PM Jeffrey Marquez  MRN:  [id number] Subjective: Patient reports " I am always depressed. 5/10 today"  Objective: Jeffrey Marquez is awake, alert and oriented X3 , found attending group session.  Denies suicidal or homicidal ideation. Denies auditory or visual hallucination and does not appear to be responding to internal stimuli. Patient reports she is medication compliant without mediation side effects. States his depression 5/10. Patient states "I am having an okay day"  Reports good appetite other wise and resting well. Patient report he has been attending group session, however is unable to recall what he has learned. Patient reports he is ready for discharge on Monday. Support, encouragement and reassurance was provided.    Principal Problem: Major depressive disorder, recurrent episode, severe (HCC) Diagnosis:   Patient Active Problem List   Diagnosis Date Noted  . Severe episode of recurrent major depressive disorder, without psychotic features (HCC) [F33.2]   . Cocaine abuse [F14.10] 04/27/2015  . Major depressive disorder, recurrent episode, severe (HCC) [F33.2] 04/27/2015  . Accelerated hypertension [I10]   . Blurry vision [H53.8]   . Hypertensive urgency [I16.0] 02/14/2015  . MDD (major depressive disorder), recurrent severe, without psychosis (HCC) [F33.2] 02/05/2015  . Unstable angina (HCC) [I20.0] 10/18/2014  . Essential hypertension, malignant [I10] 01/10/2014  . DM II (diabetes mellitus, type II), controlled (HCC) [E11.9] 11/27/2013   Total Time spent with patient: 20 minutes   Past Psychiatric History: See Above  Past Medical History:  Past Medical History  Diagnosis Date  . Diverticulitis   . Hypertension   . Diverticula of colon 2012  . Diabetes mellitus without complication (HCC)   . Obesity   . Drug abuse   . Alcohol abuse     Past Surgical History  Procedure Laterality Date  . Cardiac catheterization N/A 10/18/2014     Procedure: Left Heart Cath and Coronary Angiography;  Surgeon: Tonny BollmanMichael Cooper, MD;  [facility];  Service: Cardiovascular;  Laterality: N/A;   Family History:  Family History  Problem Relation Age of Onset  . Diabetes Mother   . Diabetes Brother    Family Psychiatric  History: Unknown Social History:  History  Alcohol Use  . Yes     History  Drug Use  . Yes  . Special: Cocaine    Social History   Social History  . Marital Status: Divorced    Spouse Name: N/A  . Number of Children: N/A  . Years of Education: N/A   Social History Main Topics  . Smoking status: Never Smoker   . Smokeless tobacco: Never Used  . Alcohol Use: Yes  . Drug Use: Yes    Special: Cocaine  . Sexual Activity: Not Asked   Other Topics Concern  . None   Social History Narrative   Additional Social History:   Sleep: improving   Appetite:  Improving   Current Medications: Current Facility-Administered Medications  Medication Dose Route Frequency Provider Last Rate Last Dose  . acetaminophen (TYLENOL) tablet 650 mg  650 mg Oral Q6H PRN Charm RingsJamison Y Lord, NP   650 mg at 05/02/15 1216  . alum & mag hydroxide-simeth (MAALOX/MYLANTA) 200-200-20 MG/5ML suspension 30 mL  30 mL Oral Q4H PRN Charm RingsJamison Y Lord, NP      . amLODipine (NORVASC) tablet 10 mg  10 mg Oral Daily Starleen Armsawood S Elgergawy, MD   10 mg at 05/05/15 0818  . aspirin EC tablet 81 mg  81 mg Oral Daily Maryann  Mikhail, DO   81 mg at 05/05/15 1610  . atorvastatin (LIPITOR) tablet 10 mg  10 mg Oral Daily Charm Rings, NP   10 mg at 05/05/15 0817  . carvedilol (COREG) tablet 25 mg  25 mg Oral BID WC Charm Rings, NP   25 mg at 05/05/15 0817  . hydrALAZINE (APRESOLINE) tablet 100 mg  100 mg Oral TID Catarina Hartshorn, MD   100 mg at 05/05/15 1133  . irbesartan (AVAPRO) tablet 300 mg  300 mg Oral Daily Charm Rings, NP   300 mg at 05/05/15 9604   And  . hydrochlorothiazide (HYDRODIURIL) tablet 25 mg  25 mg Oral Daily Charm Rings, NP    25 mg at 05/05/15 0818  . LORazepam (ATIVAN) tablet 1 mg  1 mg Oral Q8H PRN Charm Rings, NP   1 mg at 04/29/15 2100  . magnesium hydroxide (MILK OF MAGNESIA) suspension 30 mL  30 mL Oral Daily PRN Charm Rings, NP      . metFORMIN (GLUCOPHAGE-XR) 24 hr tablet 500 mg  500 mg Oral Q breakfast Charm Rings, NP   500 mg at 05/05/15 0817  . mirtazapine (REMERON) tablet 15 mg  15 mg Oral QHS Craige Cotta, MD   15 mg at 05/04/15 2150  . nitroGLYCERIN (NITROSTAT) SL tablet 0.4 mg  0.4 mg Sublingual Q5 min PRN Charm Rings, NP      . potassium chloride SA (K-DUR,KLOR-CON) CR tablet 20 mEq  20 mEq Oral Daily Charm Rings, NP   20 mEq at 05/05/15 0818  . sertraline (ZOLOFT) tablet 150 mg  150 mg Oral Daily Craige Cotta, MD   150 mg at 05/05/15 5409    Lab Results:  Results for orders placed or performed during the hospital encounter of 04/27/15 (from the past 48 hour(s))  Glucose, capillary     Status: Abnormal   Collection Time: 05/03/15  4:51 PM  Result Value Ref Range   Glucose-Capillary 129 (H) 65 - 99 mg/dL  Glucose, capillary     Status: Abnormal   Collection Time: 05/04/15  6:17 AM  Result Value Ref Range   Glucose-Capillary 109 (H) 65 - 99 mg/dL   Comment 1 Notify RN   Glucose, capillary     Status: Abnormal   Collection Time: 05/04/15  4:19 PM  Result Value Ref Range   Glucose-Capillary 162 (H) 65 - 99 mg/dL   Comment 1 Notify RN    Comment 2 Document in Chart   Glucose, capillary     Status: Abnormal   Collection Time: 05/04/15  8:35 PM  Result Value Ref Range   Glucose-Capillary 119 (H) 65 - 99 mg/dL  Glucose, capillary     Status: None   Collection Time: 05/05/15  6:09 AM  Result Value Ref Range   Glucose-Capillary 97 65 - 99 mg/dL    Blood Alcohol level:  Lab Results  Component Value Date   ETH <5 04/27/2015   ETH <5 02/04/2015    Physical Findings: AIMS: Facial and Oral Movements Muscles of Facial Expression: None, normal Lips and Perioral Area:  None, normal Jaw: None, normal Tongue: None, normal,Extremity Movements Upper (arms, wrists, hands, fingers): None, normal Lower (legs, knees, ankles, toes): None, normal, Trunk Movements Neck, shoulders, hips: None, normal, Overall Severity Severity of abnormal movements (highest score from questions above): None, normal Incapacitation due to abnormal movements: None, normal Patient's awareness of abnormal movements (rate only patient's report): No  Awareness, Dental Status Current problems with teeth and/or dentures?: No Does patient usually wear dentures?: No  CIWA:    COWS:     Musculoskeletal: Strength & Muscle Tone: within normal limits Gait & Station: normal Patient leans: Right  Psychiatric Specialty Exam: Review of Systems  Psychiatric/Behavioral: Positive for depression, suicidal ideas and substance abuse. Negative for hallucinations. The patient has insomnia.   All other systems reviewed and are negative. no chest pain or shortness of breath at room air at this time , denies epigastric discomfort or GI bleed   Blood pressure 147/93, pulse 74, temperature 97.9 F (36.6 C), temperature source Oral, resp. rate 20, height 5' 9.4" (1.763 m), weight 118.842 kg (262 lb), SpO2 95 %.Body mass index is 38.24 kg/(m^2).  General Appearance:fairly groomed, pleasant and cooperative    Eye Contact::  Good   Speech:  Normal   Volume:  Normal  Mood:  Remains depressed, but gradual/partial  improvement noted   Affect: constricted   Thought Process:  Linear  Orientation:  Full (Time, Place, and Person)  Thought Content:  Denies  hallucinations, no delusions   Suicidal Thoughts: at this time denies  any suicidal ideations, denies self injurious ideations   Homicidal Thoughts:  No  Memory:  Recent and remote grossly intact   Judgement:   Improving   Insight:   Improving   Psychomotor Activity: normal  Concentration:  Good  Recall:  Good  Fund of Knowledge:Good  Language: Good   Akathisia:  No  Handed:  Right  AIMS (if indicated):     Assets:  Housing Social Support  ADL's:  Intact  Cognition: WNL  Sleep:  Number of Hours: 6.25    I agree with current treatment plan on 05/05/2015, Patient seen face-to-face for psychiatric evaluation follow-up, chart reviewed. Reviewed the information documented and agree with the treatment plan.   Treatment Plan Summary: Daily contact with patient to assess and evaluate symptoms and progress in treatment and Medication management   Continue Zoloft  150  mgs  QDAY for  Depression  Continue Remeron to 15 mgrs QHS for depression and insomnia  Treatment team working on disposition options . Encourage ongoing milieu, group participation to work on coping skills and symptom reduction . Continue antihypertensive treatment , now on ASA 81 mgrs QDAY as prophylaxis .  Oneta Rack, NP 05/05/2015, 1:25 PM

## 2015-05-06 LAB — GLUCOSE, CAPILLARY
Glucose-Capillary: 92 mg/dL (ref 65–99)
Glucose-Capillary: 89 mg/dL (ref 65–99)

## 2015-05-06 NOTE — Progress Notes (Signed)
Patient ID: Jeffrey Marquez, male   DOB: 05/28/1960, 55 y.o.   MRN: [id number]  DAR: Pt. Denies SI/HI and A/V Hallucinations. He reports he is able to contract for safety if he is feeling unsafe. Patient does not report any pain or discomfort at this time. No PRN medications needed at this time. He does reports sleep is good, appetite is fair, energy level is normal, and concentration is good. He rates depression and hopelessness 5/10. Support and encouragement provided to the patient. Scheduled medications administered to patient per physician's orders. `Patient is receptive and cooperative. He is seen in the milieu at times and is smiling more than he has been. Q15 minute checks are maintained for safety.

## 2015-05-06 NOTE — Plan of Care (Signed)
Problem: Ineffective individual coping Goal: STG: Patient will remain free from self harm Outcome: Progressing Patient has remained free from self harm, safety maintained on unit with 15 min checks.

## 2015-05-06 NOTE — [facility] Group Notes (Signed)
[facility] Group Notes:  (Nursing/MHT/Case Management/Adjunct)  Date:  05/06/2015  Time:  1315  Type of Therapy:  Nurse Education  /  Healthy Support Systems:  The group focuses on teaching patients how to develop healthy support systems.  Participation Level:  Pt did not attend.  Participation Quality:  n/a  Affect:  n/a  Cognitive: n/a  Insight:  n/a  Engagement in Group:  n/a  Modes of Intervention:  n/a  Summary of Progress/Problems:n/a  Rich BraveDuke, [place] 05/06/2015, 3:42 PM

## 2015-05-06 NOTE — [facility] Group Notes (Signed)
[facility] LCSW Group Therapy  05/06/2015 10:00 AM   Type of Therapy:  Group Therapy  Participation Level:  Did Not Attend  Iza Preston, LCSW 05/06/2015 11:44 AM    

## 2015-05-06 NOTE — Progress Notes (Signed)
[facility]  05/06/2015 2:25 PM Jeffrey CorollaGary Marquez  MRN:  [id number]   Subjective: Patient reports " I am good, I get to leave tomorrow".  Objective: Jeffrey CorollaGary Marquez is awake, alert and oriented X3 , found attending group session.  Denies suicidal or homicidal ideation. Denies auditory or visual hallucination and does not appear to be responding to internal stimuli. Patient reports she is medication compliant without mediation side effects. States his depression 4/10. Patient states "I am getting ready for discharge on tomorrow."  Reports good appetite other wise and resting well. Patient report he has been attending group session, however is unable to recall what he has learned. Patient reports he is ready for discharge. Support, encouragement and reassurance was provided.    Principal Problem: Major depressive disorder, recurrent episode, severe (HCC) Diagnosis:   Patient Active Problem List   Diagnosis Date Noted  . Severe episode of recurrent major depressive disorder, without psychotic features (HCC) [F33.2]   . Cocaine abuse [F14.10] 04/27/2015  . Major depressive disorder, recurrent episode, severe (HCC) [F33.2] 04/27/2015  . Accelerated hypertension [I10]   . Blurry vision [H53.8]   . Hypertensive urgency [I16.0] 02/14/2015  . MDD (major depressive disorder), recurrent severe, without psychosis (HCC) [F33.2] 02/05/2015  . Unstable angina (HCC) [I20.0] 10/18/2014  . Essential hypertension, malignant [I10] 01/10/2014  . DM II (diabetes mellitus, type II), controlled (HCC) [E11.9] 11/27/2013   Total Time spent with patient: 20 minutes   Past Psychiatric History: See Above  Past Medical History:  Past Medical History  Diagnosis Date  . Diverticulitis   . Hypertension   . Diverticula of colon 2012  . Diabetes mellitus without complication (HCC)   . Obesity   . Drug abuse   . Alcohol abuse     Past Surgical History  Procedure Laterality Date  . Cardiac catheterization N/A  10/18/2014    Procedure: Left Heart Cath and Coronary Angiography;  Surgeon: Tonny BollmanMichael Cooper, MD;  [facility];  Service: Cardiovascular;  Laterality: N/A;   Family History:  Family History  Problem Relation Age of Onset  . Diabetes Mother   . Diabetes Brother    Family Psychiatric  History: Unknown Social History:  History  Alcohol Use  . Yes     History  Drug Use  . Yes  . Special: Cocaine    Social History   Social History  . Marital Status: Divorced    Spouse Name: N/A  . Number of Children: N/A  . Years of Education: N/A   Social History Main Topics  . Smoking status: Never Smoker   . Smokeless tobacco: Never Used  . Alcohol Use: Yes  . Drug Use: Yes    Special: Cocaine  . Sexual Activity: Not Asked   Other Topics Concern  . None   Social History Narrative   Additional Social History:   Sleep: improving   Appetite:  Improving   Current Medications: Current Facility-Administered Medications  Medication Dose Route Frequency Provider Last Rate Last Dose  . acetaminophen (TYLENOL) tablet 650 mg  650 mg Oral Q6H PRN Charm RingsJamison Y Lord, NP   650 mg at 05/02/15 1216  . alum & mag hydroxide-simeth (MAALOX/MYLANTA) 200-200-20 MG/5ML suspension 30 mL  30 mL Oral Q4H PRN Charm RingsJamison Y Lord, NP      . amLODipine (NORVASC) tablet 10 mg  10 mg Oral Daily Starleen Armsawood S Elgergawy, MD   10 mg at 05/06/15 0835  . aspirin EC tablet 81 mg  81  mg Oral Daily Maryann Mikhail, DO   81 mg at 05/06/15 4098  . atorvastatin (LIPITOR) tablet 10 mg  10 mg Oral Daily Charm Rings, NP   10 mg at 05/06/15 0835  . carvedilol (COREG) tablet 25 mg  25 mg Oral BID WC Charm Rings, NP   25 mg at 05/06/15 0835  . hydrALAZINE (APRESOLINE) tablet 100 mg  100 mg Oral TID Catarina Hartshorn, MD   100 mg at 05/06/15 1211  . irbesartan (AVAPRO) tablet 300 mg  300 mg Oral Daily Charm Rings, NP   300 mg at 05/06/15 1191   And  . hydrochlorothiazide (HYDRODIURIL) tablet 25 mg  25 mg Oral Daily  Charm Rings, NP   25 mg at 05/06/15 0835  . LORazepam (ATIVAN) tablet 1 mg  1 mg Oral Q8H PRN Charm Rings, NP   1 mg at 04/29/15 2100  . magnesium hydroxide (MILK OF MAGNESIA) suspension 30 mL  30 mL Oral Daily PRN Charm Rings, NP      . metFORMIN (GLUCOPHAGE-XR) 24 hr tablet 500 mg  500 mg Oral Q breakfast Charm Rings, NP   500 mg at 05/06/15 0835  . mirtazapine (REMERON) tablet 15 mg  15 mg Oral QHS Craige Cotta, MD   15 mg at 05/05/15 2052  . nitroGLYCERIN (NITROSTAT) SL tablet 0.4 mg  0.4 mg Sublingual Q5 min PRN Charm Rings, NP      . potassium chloride SA (K-DUR,KLOR-CON) CR tablet 20 mEq  20 mEq Oral Daily Charm Rings, NP   20 mEq at 05/06/15 0835  . sertraline (ZOLOFT) tablet 150 mg  150 mg Oral Daily Craige Cotta, MD   150 mg at 05/06/15 4782    Lab Results:  Results for orders placed or performed during the hospital encounter of 04/27/15 (from the past 48 hour(s))  Glucose, capillary     Status: Abnormal   Collection Time: 05/04/15  4:19 PM  Result Value Ref Range   Glucose-Capillary 162 (H) 65 - 99 mg/dL   Comment 1 Notify RN    Comment 2 Document in Chart   Glucose, capillary     Status: Abnormal   Collection Time: 05/04/15  8:35 PM  Result Value Ref Range   Glucose-Capillary 119 (H) 65 - 99 mg/dL  Glucose, capillary     Status: None   Collection Time: 05/05/15  6:09 AM  Result Value Ref Range   Glucose-Capillary 97 65 - 99 mg/dL  Glucose, capillary     Status: Abnormal   Collection Time: 05/05/15  4:54 PM  Result Value Ref Range   Glucose-Capillary 131 (H) 65 - 99 mg/dL  Glucose, capillary     Status: None   Collection Time: 05/06/15  6:19 AM  Result Value Ref Range   Glucose-Capillary 92 65 - 99 mg/dL    Blood Alcohol level:  Lab Results  Component Value Date   ETH <5 04/27/2015   ETH <5 02/04/2015    Physical Findings: AIMS: Facial and Oral Movements Muscles of Facial Expression: None, normal Lips and Perioral Area: None,  normal Jaw: None, normal Tongue: None, normal,Extremity Movements Upper (arms, wrists, hands, fingers): None, normal Lower (legs, knees, ankles, toes): None, normal, Trunk Movements Neck, shoulders, hips: None, normal, Overall Severity Severity of abnormal movements (highest score from questions above): None, normal Incapacitation due to abnormal movements: None, normal Patient's awareness of abnormal movements (rate only patient's report): No Awareness, Dental Status Current  problems with teeth and/or dentures?: No Does patient usually wear dentures?: No  CIWA:    COWS:     Musculoskeletal: Strength & Muscle Tone: within normal limits Gait & Station: normal Patient leans: Right  Psychiatric Specialty Exam: Review of Systems  Psychiatric/Behavioral: Positive for depression. Negative for suicidal ideas and hallucinations. The patient has insomnia.   All other systems reviewed and are negative. no chest pain or shortness of breath at room air at this time , denies epigastric discomfort or GI bleed   Blood pressure 116/64, pulse 66, temperature 97.7 F (36.5 C), temperature source Oral, resp. rate 15, height 5' 9.4" (1.763 m), weight 118.842 kg (262 lb), SpO2 95 %.Body mass index is 38.24 kg/(m^2).  General Appearance:fairly groomed, pleasant and cooperative    Eye Contact::  Good   Speech:  Normal   Volume:  Normal  Mood:  Remains depressed, but gradual/partial  improvement noted   Affect: constricted   Thought Process:  Linear  Orientation:  Full (Time, Place, and Person)  Thought Content:  Denies  hallucinations, no delusions   Suicidal Thoughts: at this time denies  any suicidal ideations, denies self injurious ideations   Homicidal Thoughts:  No  Memory:  Recent and remote grossly intact   Judgement:   Improving   Insight:   Improving   Psychomotor Activity: normal  Concentration:  Good  Recall:  Good  Fund of Knowledge:Good  Language: Good  Akathisia:  No  Handed:   Right  AIMS (if indicated):     Assets:  Housing Social Support  ADL's:  Intact  Cognition: WNL  Sleep:  Number of Hours: 6.75    I agree with current treatment plan on 05/06/2015, Patient seen face-to-face for psychiatric evaluation follow-up, chart reviewed. Reviewed the information documented and agree with the treatment plan.   Treatment Plan Summary: Daily contact with patient to assess and evaluate symptoms and progress in treatment and Medication management  Continue Zoloft 150 mgs  QDAY for  Depression  Continue Remeron to 15 mgrs QHS for depression and insomnia  Treatment team working on disposition options . Encourage ongoing milieu, group participation to work on coping skills and symptom reduction . Continue antihypertensive treatment , now on ASA 81 mgrs QDAY as prophylaxis .  Oneta Rack, NP 05/06/2015, 2:25 PM

## 2015-05-06 NOTE — Progress Notes (Signed)
Adult Psychoeducational Group Note  Date:  05/06/2015 Time:  8:20 PM  Group Topic/Focus:  Wrap-Up Group:   The focus of this group is to help patients review their daily goal of treatment and discuss progress on daily workbooks.  Participation Level:  Did Not Attend  Pt was asleep during wrap-up group.    Jeffrey NeerJasmine Marquez Jeffrey Marquez 05/06/2015, 8:40 PM

## 2015-05-07 LAB — GLUCOSE, CAPILLARY: Glucose-Capillary: 93 mg/dL (ref 65–99)

## 2015-05-07 MED ORDER — HYDRALAZINE HCL 100 MG PO TABS: 100.0000 mg | ORAL_TABLET | Freq: Three times a day (TID) | ORAL | Status: AC

## 2015-05-07 MED ORDER — MIRTAZAPINE 15 MG PO TABS: 15.0000 mg | ORAL_TABLET | Freq: Every day | ORAL | Status: AC

## 2015-05-07 MED ORDER — AMLODIPINE BESYLATE 10 MG PO TABS: 10.0000 mg | ORAL_TABLET | Freq: Every day | ORAL | Status: AC

## 2015-05-07 MED ORDER — SERTRALINE HCL 50 MG PO TABS: 150.0000 mg | ORAL_TABLET | Freq: Every day | ORAL | Status: AC

## 2015-05-07 MED ORDER — HYDROCHLOROTHIAZIDE 25 MG PO TABS: 25.0000 mg | ORAL_TABLET | Freq: Every day | ORAL | Status: DC

## 2015-05-07 MED ORDER — VALSARTAN-HYDROCHLOROTHIAZIDE 320-25 MG PO TABS: 1.0000 | ORAL_TABLET | Freq: Every day | ORAL | Status: AC

## 2015-05-07 MED ORDER — ASPIRIN 81 MG PO TBEC: 81.0000 mg | DELAYED_RELEASE_TABLET | Freq: Every day | ORAL | Status: AC

## 2015-05-07 MED ORDER — ATORVASTATIN CALCIUM 10 MG PO TABS: 10.0000 mg | ORAL_TABLET | Freq: Every day | ORAL | Status: AC

## 2015-05-07 MED ORDER — NITROGLYCERIN 0.4 MG SL SUBL
0.4000 mg | SUBLINGUAL_TABLET | SUBLINGUAL | Status: AC | PRN
Start: 2015-05-07 — End: ?

## 2015-05-07 MED ORDER — METFORMIN HCL ER 500 MG PO TB24: 500.0000 mg | ORAL_TABLET | Freq: Every day | ORAL | Status: AC

## 2015-05-07 MED ORDER — IRBESARTAN 300 MG PO TABS: 300.0000 mg | ORAL_TABLET | Freq: Every day | ORAL | Status: DC

## 2015-05-07 MED ORDER — POTASSIUM CHLORIDE CRYS ER 20 MEQ PO TBCR: 20.0000 meq | EXTENDED_RELEASE_TABLET | Freq: Every day | ORAL | Status: AC

## 2015-05-07 MED ORDER — CARVEDILOL 25 MG PO TABS: 25.0000 mg | ORAL_TABLET | Freq: Two times a day (BID) | ORAL | Status: AC

## 2015-05-07 NOTE — Progress Notes (Addendum)
Patient has remained isolative to his room tonight. He repoerts that he wanted to spend some time to himself before discharge on tomorrow. [facility]  He requested to shave and did. He denies si/hi/a/v hallucinations. Support given and safety mainntained on unit with 15 min checks.

## 2015-05-07 NOTE — Plan of Care (Signed)
Problem: Alteration in mood Goal: LTG-Patient reports reduction in suicidal thoughts (Patient reports reduction in suicidal thoughts and is able to verbalize a safety plan for whenever patient is feeling suicidal)  Outcome: Progressing Patient currently denies suicidal ideations.      

## 2015-05-07 NOTE — Progress Notes (Signed)
  [facility] :  Will you be returning to the same living situation after discharge:  No. [facility] At discharge, do you have transportation home?: Yes,  bus pass will be provided Do you have the ability to pay for your medications: Yes,  patient will be provided with prescriptions at discharge  Release of information consent forms completed and in the chart;  Patient's signature needed at discharge.  Patient to Follow up at: Follow-up Information    Follow up with [facility].   Why:  Walk-in clinic Monday-Friday between 8am to 3pm for assessment for therapy and medication management services. Please arrive early in the morning in order to be seen as quickly as possible.    Contact information:   [address] [place], [place] [address] Phone: [phone] Fax: [phone]      Next level of care provider [facility] Link:no  Safety Planning and Suicide Prevention discussed: Yes,  with patient  Have you used any form of tobacco in the last 30 days? (Cigarettes, Smokeless Tobacco, Cigars, and/or Pipes): No  Has patient been referred to the Quitline?: N/A patient is not a smoker  Patient has been referred for addiction treatment: Yes  Jeffrey Marquez, [place] CarboKristin L 05/07/2015, 10:45 AM

## 2015-05-07 NOTE — [facility] Group Notes (Signed)
   [facility]  05/07/2015  8:45 AM   Participation Quality: Alert, Appropriate and Oriented  Mood/Affect: Blunted but improving   Depression Rating: 4  Anxiety Rating: Reports low anxiety today  Thoughts of Suicide: Pt denies SI/HI  Will you contract for safety? Yes  Current AVH: Pt denies  Plan for Discharge/Comments: Pt attended discharge planning group and actively participated in group. CSW provided pt with today's workbook. Patient reports feeling improvement in his symptoms. [facility] case managers regarding shelter placement.   Transportation Means: Pt reports access to transportation by bus pass  Supports: No supports mentioned at this time  Samuella BruinKristin Joevanni Roddey, MSW, Johnson [employer] JohnsonLCSW Clinical Social Worker [facility] [phone]

## 2015-05-07 NOTE — Discharge Summary (Signed)
Physician Discharge Summary Note  Patient:  Jeffrey Marquez is an 55 y.o., male MRN:  [id number] DOB:  10/10/1960 Patient phone:  [phone] (home)  Patient address:   [address]  [place] [address],  Total Time spent with patient: 30 minutes  Date of Admission:  04/27/2015 Date of Discharge: 05/07/2015  Reason for Admission:  depression   Principal Problem: Major depressive disorder, recurrent episode, severe [facility]) Discharge Diagnoses: Patient Active Problem List   Diagnosis Date Noted  . Severe episode of recurrent major depressive disorder, without psychotic features (HCC) [F33.2]   . Cocaine abuse [F14.10] 04/27/2015  . Major depressive disorder, recurrent episode, severe (HCC) [F33.2] 04/27/2015  . Accelerated hypertension [I10]   . Blurry vision [H53.8]   . Hypertensive urgency [I16.0] 02/14/2015  . MDD (major depressive disorder), recurrent severe, without psychosis (HCC) [F33.2] 02/05/2015  . Unstable angina (HCC) [I20.0] 10/18/2014  . Essential hypertension, malignant [I10] 01/10/2014  . DM II (diabetes mellitus, type II), controlled (HCC) [E11.9] 11/27/2013    Past Psychiatric History:  See above noted Past Medical History:  Past Medical History  Diagnosis Date  . Diverticulitis   . Hypertension   . Diverticula of colon 2012  . Diabetes mellitus without complication (HCC)   . Obesity   . Drug abuse   . Alcohol abuse     Past Surgical History  Procedure Laterality Date  . Cardiac catheterization N/A 10/18/2014    Procedure: Left Heart Cath and Coronary Angiography;  Surgeon: Tonny Bollman, MD;  Location: [facility] INVASIVE CV LAB;  Service: Cardiovascular;  Laterality: N/A;   Family History:  Family History  Problem Relation Age of Onset  . Diabetes Mother   . Diabetes Brother    Family Psychiatric  History:  See above noted Social History:  History  Alcohol Use  . Yes     History  Drug Use  . Yes  . Special: Cocaine    Social History   Social  History  . Marital Status: Divorced    Spouse Name: N/A  . Number of Children: N/A  . Years of Education: N/A   Social History Main Topics  . Smoking status: Never Smoker   . Smokeless tobacco: Never Used  . Alcohol Use: Yes  . Drug Use: Yes    Special: Cocaine  . Sexual Activity: Not Asked   Other Topics Concern  . None   Social History Narrative    Hospital Course:   Jeffrey Marquez was admitted for Major depressive disorder, recurrent episode, severe (HCC) and crisis management.  He was treated with meds listed below.  Medical problems were identified and treated as needed.  Home medications were restarted as appropriate.  Improvement was monitored by observation and Jeffrey Marquez daily report of symptom reduction.  Emotional and mental status was monitored by daily self inventory reports completed by Jeffrey Marquez and clinical staff.  Patient reported continued improvement, denied any new concerns.  Patient had been compliant on medications and denied side effects.  Support and encouragement was provided.    Patient did well during inpatient stay.  At time of discharge, patient rated both depression and anxiety levels to be manageable and minimal.  Patient was able to identify the triggers of emotional crises and de-stabilizations.  Patient identified the positive things in life that would help in dealing with feelings of loss, depression and unhealthy or abusive tendencies.         Jeffrey Marquez was evaluated by the treatment team for stability  and plans for continued recovery upon discharge.  He was offered further treatment options upon discharge including Residential, Intensive Outpatient and Outpatient treatment. He will follow up with agencies listed below for medication management and counseling.  Encouraged patient to maintain satisfactory support network and home environment.  Advised to adhere to medication compliance and outpatient treatment follow up.      Jeffrey Marquez  motivation was an integral factor for scheduling further treatment.  Employment, transportation, bed availability, health status, family support, and any pending legal issues were also considered during his hospital stay.  Upon completion of this admission the patient was both mentally and medically stable for discharge denying suicidal/homicidal ideation, auditory/visual/tactile hallucinations, delusional thoughts and paranoia.      Physical Findings: AIMS: Facial and Oral Movements Muscles of Facial Expression: None, normal Lips and Perioral Area: None, normal Jaw: None, normal Tongue: None, normal,Extremity Movements Upper (arms, wrists, hands, fingers): None, normal Lower (legs, knees, ankles, toes): None, normal, Trunk Movements Neck, shoulders, hips: None, normal, Overall Severity Severity of abnormal movements (highest score from questions above): None, normal Incapacitation due to abnormal movements: None, normal Patient's awareness of abnormal movements (rate only patient's report): No Awareness, Dental Status Current problems with teeth and/or dentures?: No Does patient usually wear dentures?: No  CIWA:    COWS:     Musculoskeletal: Strength & Muscle Tone: within normal limits Gait & Station: normal Patient leans: N/A  Psychiatric Specialty Exam:  SEE MD SRA Review of Systems  Psychiatric/Behavioral: Negative for depression, suicidal ideas and substance abuse.  All other systems reviewed and are negative.   Blood pressure 108/67, pulse 72, temperature 98.1 F (36.7 C), temperature source Oral, resp. rate 18, height 5' 9.4" (1.763 m), weight 118.842 kg (262 lb), SpO2 95 %.Body mass index is 38.24 kg/(m^2).  Have you used any form of tobacco in the last 30 days? (Cigarettes, Smokeless Tobacco, Cigars, and/or Pipes): No  Has this patient used any form of tobacco in the last 30 days? (Cigarettes, Smokeless Tobacco, Cigars, and/or Pipes) Yes, N/A  Blood Alcohol level:  Lab  Results  Component Value Date   ETH <5 04/27/2015   ETH <5 02/04/2015    Metabolic Disorder Labs:  Lab Results  Component Value Date   HGBA1C 6.2* 02/14/2015   MPG 131 02/14/2015   MPG 140 02/08/2015   No results found for: PROLACTIN Lab Results  Component Value Date   CHOL 235* 11/18/2013   TRIG 306* 11/18/2013   HDL 35* 11/18/2013   CHOLHDL 6.7 11/18/2013   VLDL 61* 11/18/2013   LDLCALC 139* 11/18/2013    See Psychiatric Specialty Exam and Suicide Risk Assessment completed by Attending Physician prior to discharge.  Discharge destination:  Home  Is patient on multiple antipsychotic therapies at discharge:  No   Has Patient had three or more failed trials of antipsychotic monotherapy by history:  No  Recommended Plan for Multiple Antipsychotic Therapies: NA     Medication List    STOP taking these medications        butalbital-acetaminophen-caffeine 50-325-40 MG tablet  Commonly known as:  FIORICET, ESGIC     guaiFENesin 600 MG 12 hr tablet  Commonly known as:  MUCINEX     Potassium Chloride ER 20 MEQ Tbcr     traZODone 50 MG tablet  Commonly known as:  DESYREL      TAKE these medications      Indication   amLODipine 10 MG tablet  Commonly known as:  NORVASC  Take 1 tablet (10 mg total) by mouth daily.   Indication:  High Blood Pressure     aspirin 81 MG EC tablet  Take 1 tablet (81 mg total) by mouth daily.   Indication:  cardiac health     atorvastatin 10 MG tablet  Commonly known as:  LIPITOR  Take 1 tablet (10 mg total) by mouth daily.   Indication:  Elevation of Both Cholesterol and Triglycerides in Blood     carvedilol 25 MG tablet  Commonly known as:  COREG  Take 1 tablet (25 mg total) by mouth 2 (two) times daily with a meal.   Indication:  High Blood Pressure of Unknown Cause     hydrALAZINE 100 MG tablet  Commonly known as:  APRESOLINE  Take 1 tablet (100 mg total) by mouth 3 (three) times daily.   Indication:  High Blood Pressure      metFORMIN 500 MG 24 hr tablet  Commonly known as:  GLUCOPHAGE XR  Take 1 tablet (500 mg total) by mouth daily with breakfast.   Indication:  Type 2 Diabetes     mirtazapine 15 MG tablet  Commonly known as:  REMERON  Take 1 tablet (15 mg total) by mouth at bedtime.   Indication:  Major Depressive Disorder     nitroGLYCERIN 0.4 MG SL tablet  Commonly known as:  NITROSTAT  Place 1 tablet (0.4 mg total) under the tongue every 5 (five) minutes as needed for chest pain.   Indication:  Acute Angina Pectoris     potassium chloride SA 20 MEQ tablet  Commonly known as:  K-DUR,KLOR-CON  Take 1 tablet (20 mEq total) by mouth daily.   Indication:  Low Amount of Potassium in the Blood     sertraline 50 MG tablet  Commonly known as:  ZOLOFT  Take 3 tablets (150 mg total) by mouth daily.   Indication:  Major Depressive Disorder     valsartan-hydrochlorothiazide 320-25 MG tablet  Commonly known as:  DIOVAN-HCT  Take 1 tablet by mouth daily.   Indication:  High Blood Pressure       Follow-up Information    Follow up with [facility].   Why:  Walk-in clinic Monday-Friday between 8am to 3pm for assessment for therapy and medication management services. Please arrive early in the morning in order to be seen as quickly as possible.    Contact information:   [address] [place], [place] [address] Phone: [phone] Fax: [phone]      Follow-up recommendations:  Activity:  as tol Diet:  as tol  Comments:  1.  Take all your medications as prescribed.   2.  Report any adverse side effects to outpatient provider. 3.  Patient instructed to not use alcohol or illegal drugs while on prescription medicines. 4.  In the event of worsening symptoms, instructed patient to call 911, the crisis hotline or go to nearest emergency room for evaluation of symptoms.  Signed: Lindwood QuaSheila May Agustin, NP [facility] 05/07/2015, 1:26 PM   Patient seen, Suicide Assessment Completed.  Disposition Plan Reviewed

## 2015-05-07 NOTE — Progress Notes (Signed)
Discharge note:  Patient received all personal belongings from unit and room.  Patient understood all discharge instructions.  Patient was given prescriptions and medication samples. Reviewed AVS/disharge with patient and he indicated understanding.  He denies SI/HI/AVH.  Patient left ambulatory with a bus ticket.

## 2015-05-07 NOTE — Tx Team (Signed)
Interdisciplinary Treatment Plan Update (Adult) Date: 05/07/2015   Time Reviewed: 9:30 AM  Progress in Treatment: Attending groups: Intermittently Participating in groups: Minimally Taking medication as prescribed: Yes Tolerating medication: Yes Family/Significant other contact made: No, unsuccessful attempts to contact daughter Patient understands diagnosis: Yes Discussing patient identified problems/goals with staff: Yes Medical problems stabilized or resolved: Yes Denies suicidal/homicidal ideation: No, patient denies Issues/concerns per patient self-inventory: Yes Other:  New problem(s) identified: N/A  Discharge Plan or Barriers:  Patient is homeless, unsure of where he will go at d/c.  05/04/15: Pt continues to be unsure about discharge plan; no place to go at d/c. 4/3: Patient plans to discharge to the [facility] to meet with [facility] case managers regarding shelter placement.    Reason for Continuation of Hospitalization:  Depression Anxiety Medication Stabilization   Comments: N/A  Estimated length of stay: Discharge anticipated for today 05/07/15   Patient is a 55 year old male, with a diagnosis of Major Depressive Disorder, on admission. Patient presented to the hospital with depressive symptoms and suicidal ideation. Patient reports primary trigger for admission is his life situation, being homeless and hopeless. Patient will benefit from crisis stabilization, medication evaluation, group therapy and psycho education in addition to case management for discharge planning. At discharge, it is recommended that patient remain compliant with established discharge plan and continued treatment.    Review of initial/current patient goals per problem list:  1. Goal(s): Patient will participate in aftercare plan   Met: Yes  Target date: 3-5 days post admission date   As evidenced by: Patient will participate within aftercare plan AEB aftercare provider and housing plan at  discharge being identified.  3/27: Goal not met: CSW assessing for appropriate referrals for pt and will have follow up secured prior to d/c.  4/3: Goal met. Patient plans to go to the [facility] to meet with case managers regarding shelter placement.     2. Goal (s): Patient will exhibit decreased depressive symptoms and suicidal ideations.   Met: Adequate for discharge per MD   Target date: 3-5 days post admission date   As evidenced by: Patient will utilize self rating of depression at 3 or below and demonstrate decreased signs of depression or be deemed stable for discharge by MD.   3/27: Patient rates depression at 9.  3/31: Pt rates depression at 9/10.  4/3: Adequate for discharge. Patient rates depression at 4, denies SI.   3. Goal(s): Patient will demonstrate decreased signs and symptoms of anxiety.   Met:  Adequate for discharge per MD   Target date: 3-5 days post admission date   As evidenced by: Patient will utilize self rating of anxiety at 3 or below and demonstrated decreased signs of anxiety, or be deemed stable for discharge by MD  3/31: Patient rates anxiety at 8.  4/3: Adequate for discharge. Patient reports improvement in his anxiety.    4. Goal(s): Patient will demonstrate decreased signs of withdrawal due to substance abuse   Met: Yes   Target date: 3-5 days post admission date   As evidenced by: Patient will produce a CIWA/COWS score of 0, have stable vitals signs, and no symptoms of withdrawal  3/27: Goal met. No withdrawal symptoms reported at this time per medical chart.    Attendees: Patient:    Family:    Physician: Dr. Parke Poisson; Dr. Sabra Heck 05/07/2015 9:30 AM  Nursing: Marcella Dubs, Grayland Ormond, [place], Desma Paganini, RN 05/07/2015 9:30 AM  Clinical Social Worker: Erasmo Downer  Annya Lizana, LCSW 05/07/2015 9:30 AM  Other: Peri Maris, LCSWA; [place], LCSW  05/07/2015 9:30 AM  Other: Norberto Sorenson, P4CC 05/07/2015 9:30 AM   Other: Lars Pinks, Case Manager 05/07/2015 9:30 AM  Other: Ethelle Lyon, NP 05/07/2015 9:30 AM  Other:               Scribe for Treatment Team:  Tilden Fossa, [place]

## 2015-05-07 NOTE — [facility] Suicide Risk Assessment (Signed)
[facility]icide Risk [facility]   Principal Problem: Major depressive disorder, recurrent episode, severe (HCC) Discharge Diagnoses:  Patient Active Problem List   Diagnosis Date Noted  . Severe episode of recurrent major depressive disorder, without psychotic features (HCC) [F33.2]   . Cocaine abuse [F14.10] 04/27/2015  . Major depressive disorder, recurrent episode, severe (HCC) [F33.2] 04/27/2015  . Accelerated hypertension [I10]   . Blurry vision [H53.8]   . Hypertensive urgency [I16.0] 02/14/2015  . MDD (major depressive disorder), recurrent severe, without psychosis (HCC) [F33.2] 02/05/2015  . Unstable angina (HCC) [I20.0] 10/18/2014  . Essential hypertension, malignant [I10] 01/10/2014  . DM II (diabetes mellitus, type II), controlled (HCC) [E11.9] 11/27/2013    Total Time spent with patient: 30 minutes  Musculoskeletal: Strength & Muscle Tone: within normal limits Gait & Station: normal Patient leans: N/A  Psychiatric Specialty Exam: ROS  Blood pressure 139/93, pulse 75, temperature 98.1 F (36.7 C), temperature source Oral, resp. rate 18, height 5' 9.4" (1.763 m), weight 262 lb (118.842 kg), SpO2 95 %.Body mass index is 38.24 kg/(m^2).  General Appearance: improved grooming   Eye Contact::  Good  Speech:  Normal Rate409  Volume:  Normal  Mood:  less depressed, improved compared to admission  Affect:  Appropriate  Thought Process:  Linear  Orientation:  Full (Time, Place, and Person)  Thought Content:  denies hallucinations, no delusions   Suicidal Thoughts:  No-at this time denies any suicidal ideations and denies any self injurious ideations   Homicidal Thoughts:  No denies any homicidal ideations  Memory:  recent and remote grossly intact   Judgement:  Other:  improving   Insight:  improving  Psychomotor Activity:  Normal  Concentration:  Good  Recall:  Good  Fund of Knowledge:Good  Language: Good  Akathisia:  Negative  Handed:  Right  AIMS (if  indicated):     Assets:  Communication Skills Desire for Improvement Resilience  Sleep:  Number of Hours: 6.5  Cognition: WNL  ADL's:  Intact   Mental Status Per Nursing Assessment::   On Admission:  Suicidal ideation indicated by patient, Suicide plan, Self-harm thoughts, Self-harm behaviors, Intention to act on suicide plan, Belief that plan would result in death  Demographic Factors:  55 year old male, currently homeless   Loss Factors: Homelessness, limited support network   Historical Factors: History of depression, history of prior psychiatric admissions for depression, history of cocaine abuse   Risk Reduction Factors:   Positive coping skills or problem solving skills  Continued Clinical Symptoms:  At this time patient significantly improved compared to admission - mood and affect improved, no thought disorder, no SI or HI, no psychotic symptoms , future oriented  Denies medication side effects. BP has tended to improve, with antihypertensive medication management.  Cognitive Features That Contribute To Risk:  No gross cognitive deficits noted upon discharge. Is alert , attentive, and oriented x 3   Suicide Risk:  Mild:  Suicidal ideation of limited frequency, intensity, duration, and specificity.  There are no identifiable plans, no associated intent, mild dysphoria and related symptoms, good self-control (both objective and subjective assessment), few other risk factors, and identifiable protective factors, including available and accessible social support.  Follow-up Information    Follow up with [facility].   Why:  Walk-in clinic Monday-Friday between 8am to 3pm for assessment for therapy and medication management services. Please arrive early in the morning in order to be seen as quickly as possible.    Contact information:  [address] [place] [address] Phone: [phone] Fax: [phone]      Plan Of Care/Follow-up recommendations:  Activity:  as  tolerated  Diet:  heawrth healthy, diabetic diet  Tests:  NA Other:  See below  Patient is leaving unit in good spirits  Plans to go to [facility] He has an established PCP but states he will change provider to [facility] clinician   Nehemiah Massed, MD 05/07/2015, 11:28 AM

## 2015-06-04 ENCOUNTER — Telehealth: Payer: Self-pay | Admitting: Cardiovascular Disease

## 2015-06-04 NOTE — Telephone Encounter (Signed)
Patient scheduled with Dr. Elease HashimotoNahser for tomorrow at 2:00.

## 2015-06-04 NOTE — Telephone Encounter (Signed)
Spoke with patient who c/o numbness that feels like "needles" and tingling in left side of neck and c/o "cramp" around left heart.  He states pain is intermittent but is occuring more frequently today.  Denies injury, recent strain or any changes in routine that could have resulted in pain or injury.  He denies that pain is reproducible.   States he is staying well hydrated and that blood sugar has been well controlled recently.  He states he is taking all medications as directed.  I encouraged him that cardiac cath from 9/16 did not show any CAD.  He was not able to come in for recent office visit with Dr. Elease HashimotoNahser so I offered him an appointment with Ward Givenshris Berge, NP for tomorrow.  He verbalized understanding and agreement and thanked me for the call.

## 2015-06-04 NOTE — Telephone Encounter (Signed)
Pt c/o of Chest Pain: STAT if CP now or developed within 24 hours  1. Are you having CP right now? No  2. Are you experiencing any other symptoms (ex. SOB, nausea, vomiting, sweating)? Numbness on right side   3. How long have you been experiencing CP? A couple of days   4. Is your CP continuous or coming and going? Coming and going  5. Have you taken Nitroglycerin? He did not say ?

## 2015-06-05 ENCOUNTER — Ambulatory Visit: Payer: Self-pay | Admitting: Cardiovascular Disease

## 2015-06-15 ENCOUNTER — Encounter: Payer: Self-pay | Admitting: Cardiovascular Disease

## 2015-07-31 ENCOUNTER — Encounter (HOSPITAL_COMMUNITY): Payer: Self-pay | Admitting: *Deleted

## 2015-07-31 ENCOUNTER — Emergency Department (HOSPITAL_COMMUNITY)
Admission: EM | Admit: 2015-07-31 | Discharge: 2015-07-31 | Disposition: A | Discharge status: Home / Self Care | Payer: Self-pay | Attending: Dermatology | Admitting: Dermatology

## 2015-07-31 DIAGNOSIS — H538 Other visual disturbances: Secondary | ICD-10-CM

## 2015-07-31 DIAGNOSIS — I10 Essential (primary) hypertension: Secondary | ICD-10-CM | POA: Insufficient documentation

## 2015-07-31 DIAGNOSIS — E0800 Diabetes mellitus due to underlying condition with hyperosmolarity without nonketotic hyperglycemic-hyperosmolar coma (NKHHC): Secondary | ICD-10-CM

## 2015-07-31 DIAGNOSIS — Z5321 Procedure and treatment not carried out due to patient leaving prior to being seen by health care provider: Secondary | ICD-10-CM | POA: Insufficient documentation

## 2015-07-31 DIAGNOSIS — I160 Hypertensive urgency: Secondary | ICD-10-CM

## 2015-07-31 LAB — BASIC METABOLIC PANEL
Anion gap: 8 (ref 5–15)
BUN: 13 mg/dL (ref 6–20)
CHLORIDE: 104 mmol/L (ref 101–111)
CO2: 26 mmol/L (ref 22–32)
Calcium: 9.2 mg/dL (ref 8.9–10.3)
Creatinine, Ser: 0.99 mg/dL (ref 0.61–1.24)
GFR calc Af Amer: >60 mL/min (ref >60)
GFR calc non Af Amer: >60 mL/min (ref >60)
Glucose, Bld: 104 mg/dL — ABNORMAL HIGH (ref 65–99)
POTASSIUM: 3.6 mmol/L (ref 3.5–5.1)
SODIUM: 138 mmol/L (ref 135–145)

## 2015-07-31 LAB — CBC
HEMATOCRIT: 43.5 % (ref 39.0–52.0)
Hemoglobin: 13.9 g/dL (ref 13.0–17.0)
MCH: 28.4 pg (ref 26.0–34.0)
MCHC: 32 g/dL (ref 30.0–36.0)
MCV: 88.8 fL (ref 78.0–100.0)
Platelets: 234 10*3/uL (ref 150–400)
RBC: 4.9 MIL/uL (ref 4.22–5.81)
RDW: 13.4 % (ref 11.5–15.5)
WBC: 9.2 10*3/uL (ref 4.0–10.5)

## 2015-07-31 LAB — I-STAT TROPONIN, ED: Troponin i, poc: 0.01 ng/mL (ref 0.00–0.08)

## 2015-07-31 LAB — GLUCOSE, POCT (MANUAL RESULT ENTRY): POC GLUCOSE: 137 mg/dL — AB (ref 70–99)

## 2015-07-31 NOTE — Congregational Nurse Program (Signed)
Congregational Nurse Program Note  Date of Encounter: 07/31/2015  Past Medical History: Past Medical History  Diagnosis Date  . Diverticulitis   . Hypertension   . Diverticula of colon 2012  . Diabetes mellitus without complication (HCC)   . Obesity   . Drug abuse   . Alcohol abuse     Encounter Details:     CNP Questionnaire - 07/31/15 1939    Patient Demographics   Is this a new or existing patient? New   Patient is considered a/an Not Applicable   Race African-American/Black   Patient Assistance   Location of Patient Assistance Not Applicable   Patient's financial/insurance status Self-Pay   Uninsured Patient Yes   Interventions Referred to ED/Urgent Care   Patient referred to apply for the following financial assistance Not Applicable   Food insecurities addressed Not Applicable   Transportation assistance No   Assistance securing medications No   Educational health offerings Chronic disease;Diabetes;Medications;Hypertension;Spiritual care   Encounter Details   Primary purpose of visit Education/Health Concerns;Chronic Illness/Condition Visit;Spiritual Care/Support Visit   Was an Emergency Department visit averted? No   Does patient have a medical provider? Yes   Patient referred to Emergency Department   Was a mental health screening completed? (GAINS tool) No   Does patient have dental issues? No   Does patient have vision issues? Yes   Was a vision referral made? No   Does your patient have an abnormal blood pressure today? Yes   Since previous encounter, have you referred patient for abnormal blood pressure that resulted in a new diagnosis or medication change? No   Does your patient have an abnormal blood glucose today? Yes   Since previous encounter, have you referred patient for abnormal blood glucose that resulted in a new diagnosis or medication change? No   Was there a life-saving intervention made? Yes    Initial visit  To see nurse this pm . Client has  been through a  Lot  But is finally getting housing and feels blessed . Needed to talk so the nurse listened and gave support . Working very hard to find his way now ,has a good job and wants to do what he needs to do .Nurse reminded client to take care of self medically and took his blood pressure which was 214/96 and his blood sugar after dinner was 137 mg  @  6 pm . Counseled regarding both and referred client to ED for severely  Elevated  Blood  Pressure . Client states he is taking his medications and has needed to take nitroglycerine.Symptom sf feeling very tired but working long hours . Very emotional today ,cried some about his problems and rising back up from not having shelter.Nurse listened. Client states he is taking his diabetes medication.  Client is moving out of  Shelter on  Thursday .

## 2015-07-31 NOTE — ED Notes (Signed)
CALLED FOR PATIENT SEVERAL TIMES BY THIS NURSE AND RADIOLOGY, NO ANSWER.

## 2015-07-31 NOTE — ED Notes (Signed)
Pt says he has been tired, fatigue, dizziness with standing and pain behind his eyes for about 2 days and went to the [facility] to get his blood pressure checked today and it was 216/124 and sent here for further evaluation for hypertesion. Pt states has been taking meds as prescribed. Reports when he exerts himself he gets a pain in his chest and short of breath

## 2015-07-31 NOTE — ED Notes (Signed)
Called to re-check Vitals. No Answer

## 2015-08-01 DIAGNOSIS — I10 Essential (primary) hypertension: Secondary | ICD-10-CM

## 2015-08-01 DIAGNOSIS — I160 Hypertensive urgency: Secondary | ICD-10-CM

## 2015-08-02 NOTE — Congregational Nurse Program (Signed)
Congregational Nurse Program Note  Date of Encounter: 08/01/2015  Past Medical History: Past Medical History  Diagnosis Date  . Diverticulitis   . Hypertension   . Diverticula of colon 2012  . Diabetes mellitus without complication (HCC)   . Obesity   . Drug abuse   . Alcohol abuse     Encounter Details:     CNP Questionnaire - 08/02/15 0142    Patient Demographics   Is this a new or existing patient? Existing   Patient is considered a/an Not Applicable   Race African-American/Black   Patient Assistance   Location of Patient Assistance Not Applicable   Patient's financial/insurance status Self-Pay   Uninsured Patient Yes   Interventions Follow-up/Education/Support provided after completed appt.;Counseled to make appt. with provider   Patient referred to apply for the following financial assistance Not Applicable   Food insecurities addressed Not Applicable   Transportation assistance No   Assistance securing medications No   Educational health offerings Chronic disease;Diabetes;Nutrition;Navigating the healthcare system;Safety;Exercise/physical activity;Hypertension;Medications;Cardiac disease   Encounter Details   Primary purpose of visit Chronic Illness/Condition Visit;Post ED/Hospitalization Visit;Spiritual Care/Support Visit;Education/Health Concerns   Was an Emergency Department visit averted? Not Applicable   Does patient have a medical provider? Yes   Patient referred to Follow up with established PCP   Was a mental health screening completed? (GAINS tool) No   Does patient have dental issues? No   Does patient have vision issues? Yes   Was a vision referral made? No   Does your patient have an abnormal blood pressure today? Yes   Since previous encounter, have you referred patient for abnormal blood pressure that resulted in a new diagnosis or medication change? No   Does your patient have an abnormal blood glucose today? Yes   Since previous encounter, have you  referred patient for abnormal blood glucose that resulted in a new diagnosis or medication change? No   Was there a life-saving intervention made? Yes    Client in after referral to  ED on previous day . States  He was given nitroglycerine and IV  To  Bring down B/P  And released. Taking  Medications ,[facility] his  Medical [facility]  And will  Follow up with her . Client moving out of  [facility] on tomorrow ,excited  And grateful . Thanked  Nurse for her help. Counseled regarding  High blood  Pressure, A1-C , nutrition ,foods to eat more  Of  And less of . Admits to eating a lot of  Fast foods  ,french fries, sodas , counseled in depth regarding need for life style  Changes .Given pamphlets  On all of the above counseling  Done along with  What is  High blood pressure and what each number means ., Wished  Client weell and to take care of his health care needs .  Keep a check on his blood pressure and blood sugars and know his  A1-C.   Did not do blood  Sugar .

## 2018-01-02 IMAGING — CT CT HEAD W/O CM
2 series · 16 of 30 positions shown, 20 images · non-contrast
Comparison: 01/03/2014

CLINICAL DATA: Headache, high blood pressure, diabetes mellitus

EXAM:
CT HEAD WITHOUT CONTRAST
TECHNIQUE: Contiguous axial images were obtained from the base of the skull
through the vertex without intravenous contrast.

[Series 2: head w/o · axial · non-contrast · 0.46mm/px · z∈[-94,+26]mm · 13 of 30 slices shown, 17 images]
[im 3/30  brain]
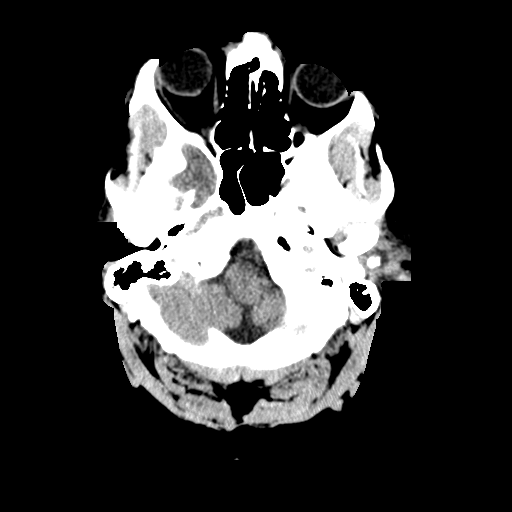
[im 3/30  bone]
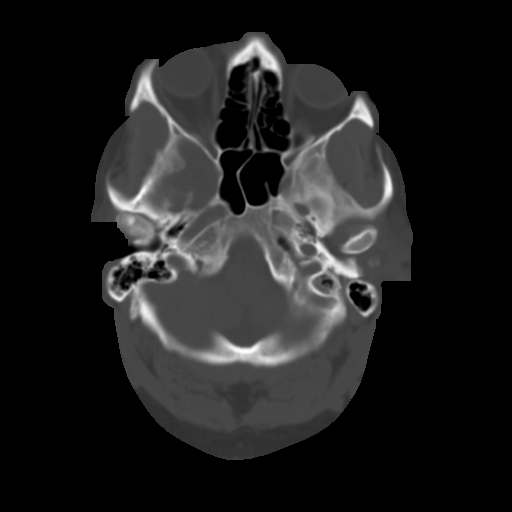
[im 5/30  brain]
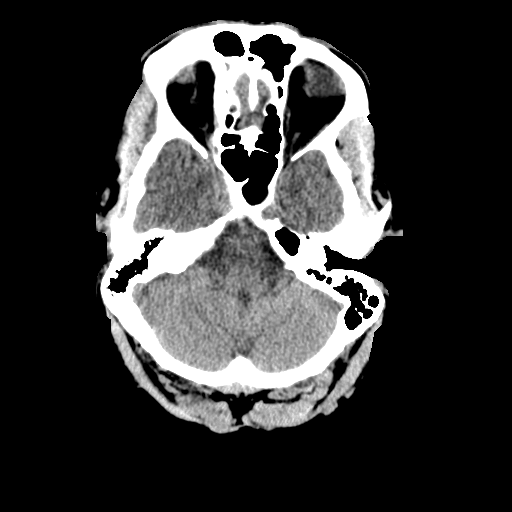
[im 7/30  brain]
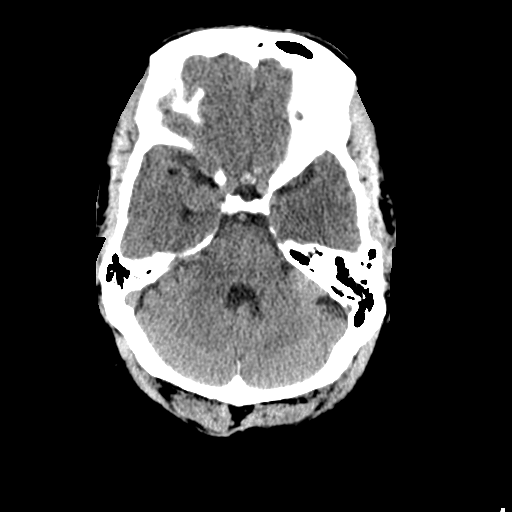
[im 9/30  brain]
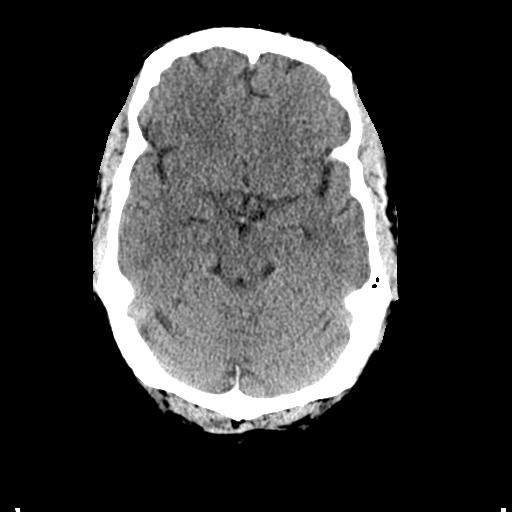
[im 11/30  brain]
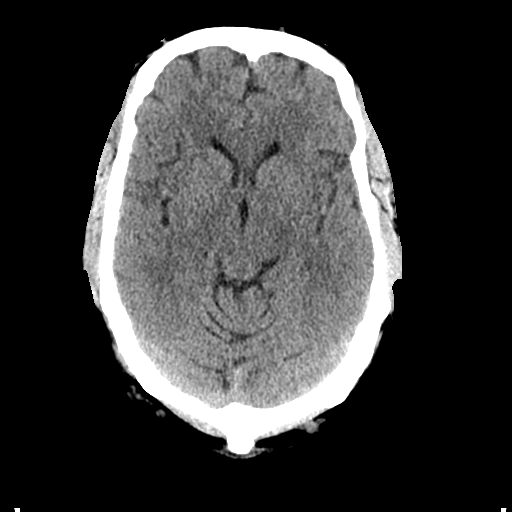
[im 11/30  bone]
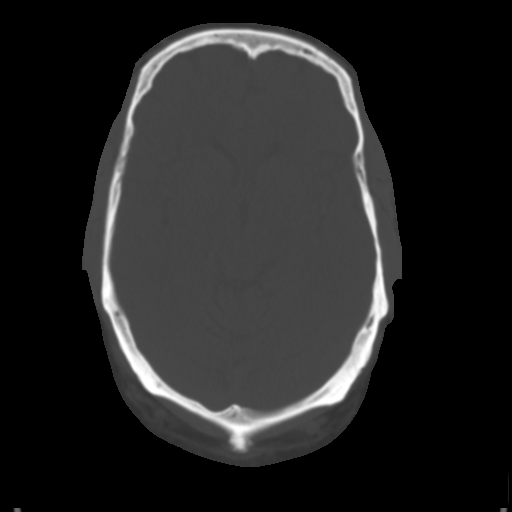
[im 13/30  brain]
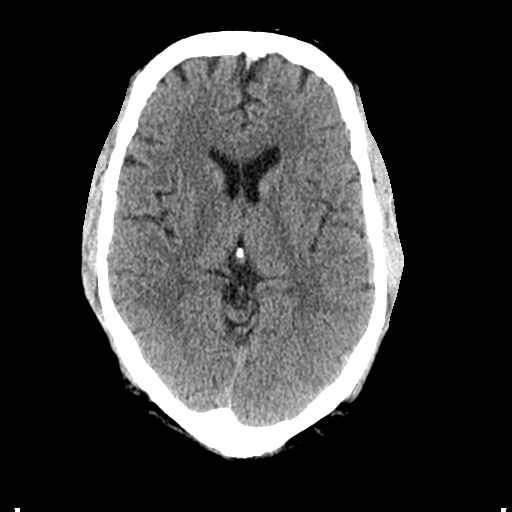
[im 15/30  brain]
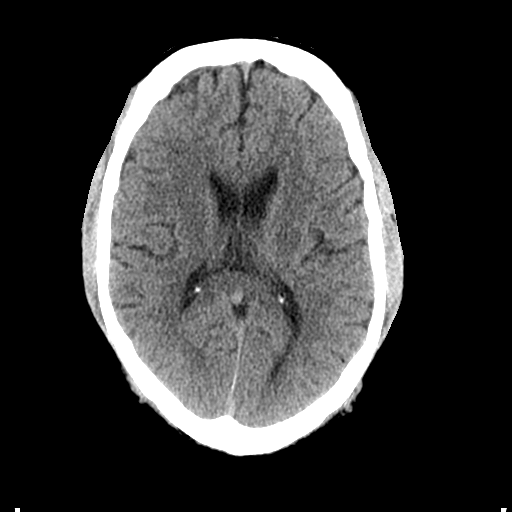
[im 17/30  brain]
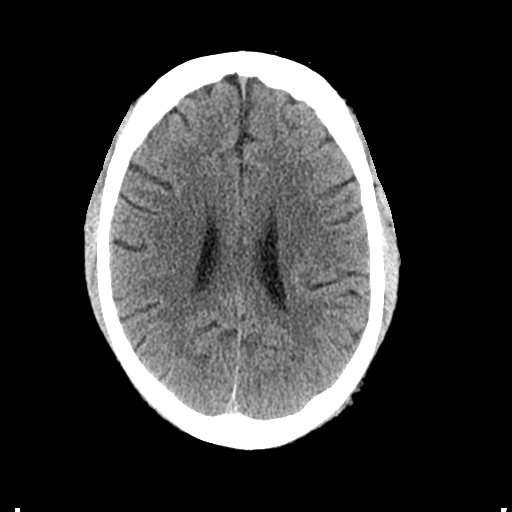
[im 19/30  brain]
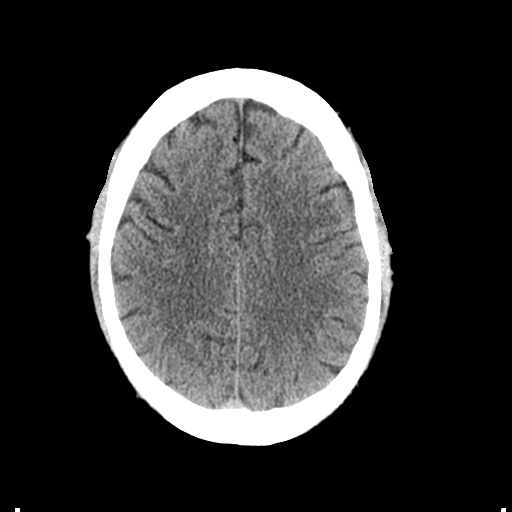
[im 19/30  bone]
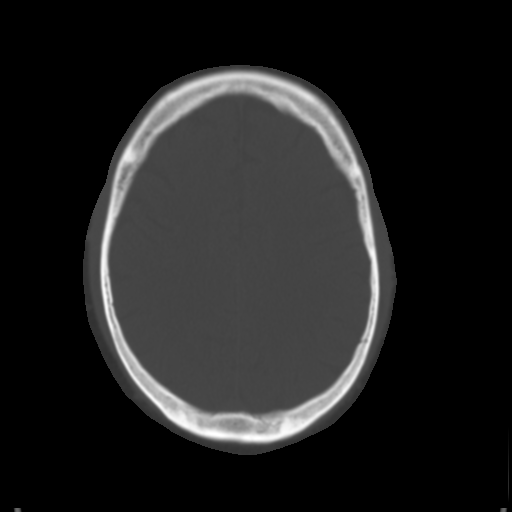
[im 21/30  brain]
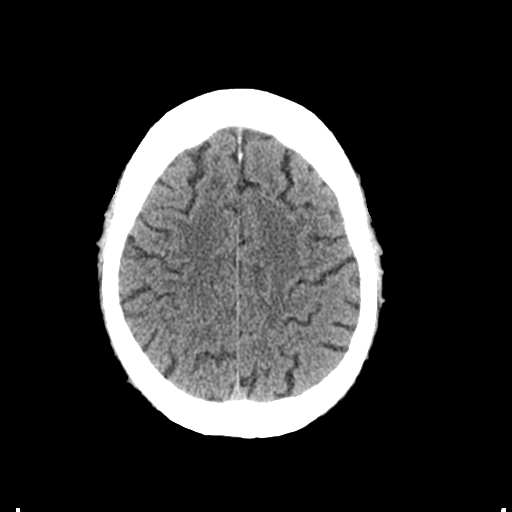
[im 23/30  brain]
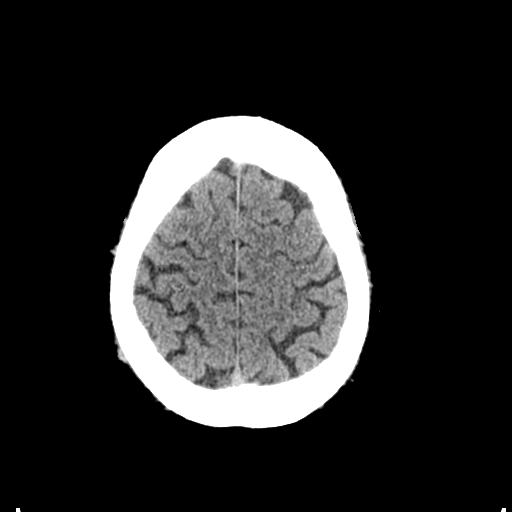
[im 25/30  brain]
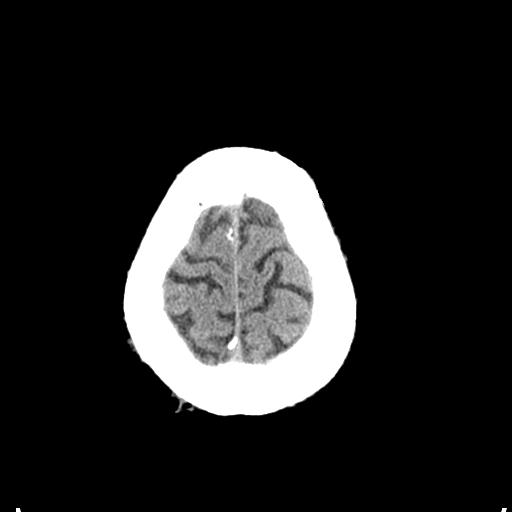
[im 27/30  brain]
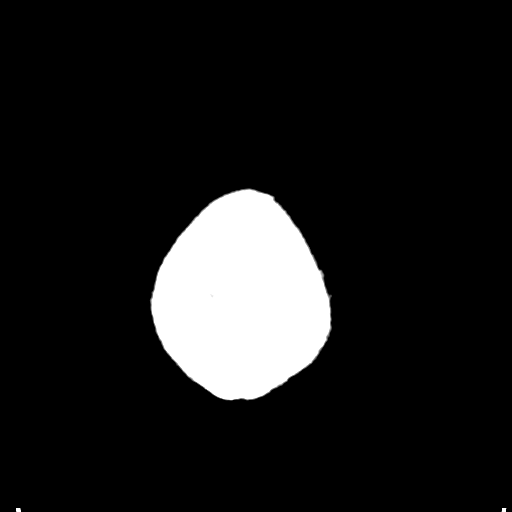
[im 27/30  bone]
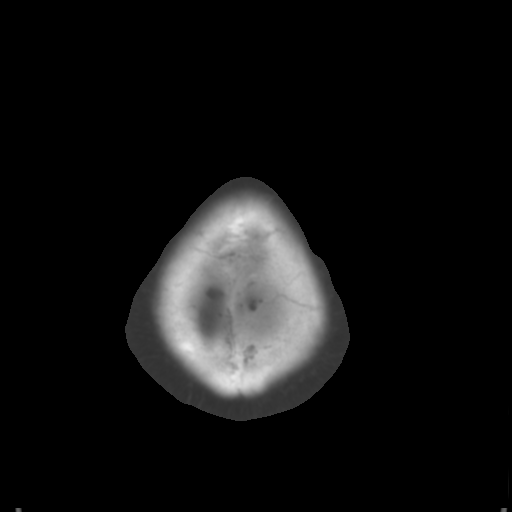

[Series 3: bone windows · axial · 0.46mm/px · z∈[-94,-54]mm · 3 of 30 slices shown]
[im 3/30  bone]
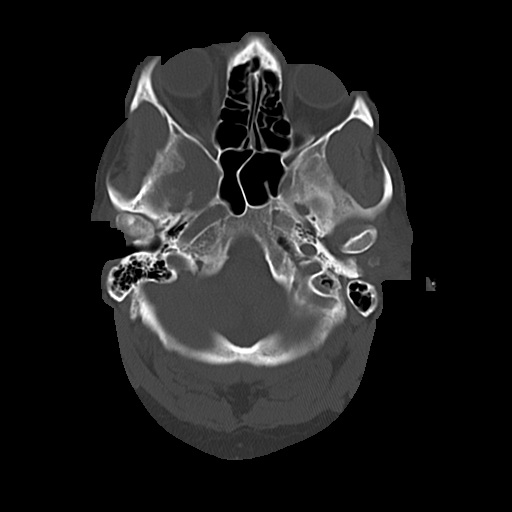
[im 7/30  bone]
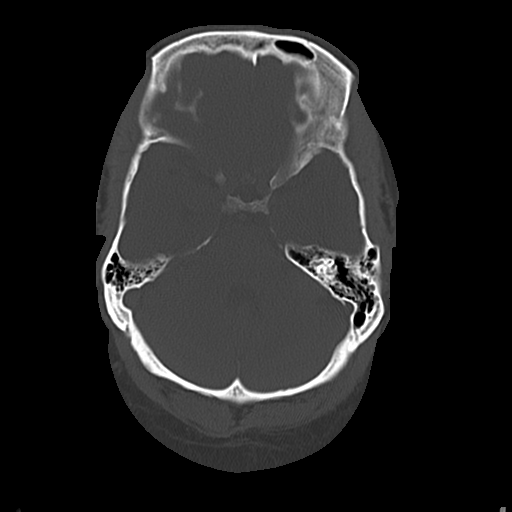
[im 11/30  bone]
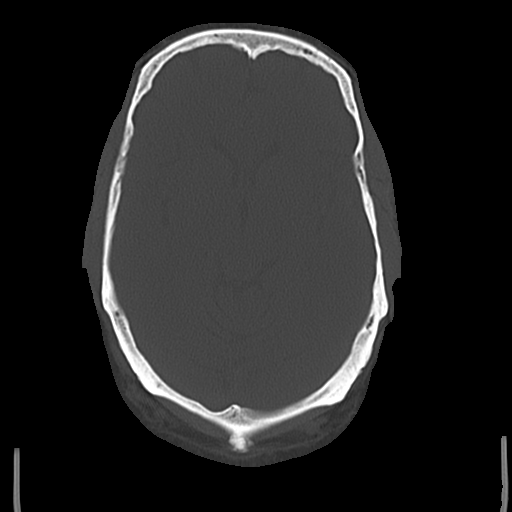

[16 of 30 positions shown; findings below may reference images not displayed]

FINDINGS: Beam hardening artifacts from jewelry at the LEFT ear.

Normal ventricular morphology.

No midline shift or mass effect.

Normal appearance of brain parenchyma.

No intracranial hemorrhage, mass lesion or evidence acute
infarction.

No extra-axial fluid collections.

Bones and sinuses unremarkable.
IMPRESSION: Normal exam.
